# Patient Record
Sex: Female | Born: 1975 | Race: Black or African American | Hispanic: No | Marital: Single | Smoking: Current some day smoker
Health system: Southern US, Community
[De-identification: ages and names within clinical notes are randomized; demographics above are authoritative.]

## PROBLEM LIST (undated history)

## (undated) DIAGNOSIS — K589 Irritable bowel syndrome without diarrhea: Secondary | ICD-10-CM

## (undated) DIAGNOSIS — K5790 Diverticulosis of intestine, part unspecified, without perforation or abscess without bleeding: Secondary | ICD-10-CM

## (undated) DIAGNOSIS — Z3046 Encounter for surveillance of implantable subdermal contraceptive: Principal | ICD-10-CM

## (undated) DIAGNOSIS — B009 Herpesviral infection, unspecified: Secondary | ICD-10-CM

## (undated) DIAGNOSIS — E079 Disorder of thyroid, unspecified: Secondary | ICD-10-CM

## (undated) DIAGNOSIS — Z30017 Encounter for initial prescription of implantable subdermal contraceptive: Secondary | ICD-10-CM

## (undated) DIAGNOSIS — K7460 Unspecified cirrhosis of liver: Secondary | ICD-10-CM

## (undated) DIAGNOSIS — M25551 Pain in right hip: Secondary | ICD-10-CM

## (undated) DIAGNOSIS — K219 Gastro-esophageal reflux disease without esophagitis: Secondary | ICD-10-CM

## (undated) DIAGNOSIS — E669 Obesity, unspecified: Secondary | ICD-10-CM

## (undated) DIAGNOSIS — Z8619 Personal history of other infectious and parasitic diseases: Secondary | ICD-10-CM

## (undated) HISTORY — DX: Obesity, unspecified: E66.9

## (undated) HISTORY — DX: Herpesviral infection, unspecified: B00.9

## (undated) HISTORY — DX: Personal history of other infectious and parasitic diseases: Z86.19

## (undated) HISTORY — DX: Gastro-esophageal reflux disease without esophagitis: K21.9

## (undated) HISTORY — DX: Pain in right hip: M25.551

## (undated) HISTORY — DX: Encounter for surveillance of implantable subdermal contraceptive: Z30.46

## (undated) HISTORY — DX: Encounter for initial prescription of implantable subdermal contraceptive: Z30.017

---

## 2003-08-06 ENCOUNTER — Emergency Department (HOSPITAL_COMMUNITY): Admission: EM | Admit: 2003-08-06 | Discharge: 2003-08-06 | Payer: Self-pay | Admitting: Emergency Medicine

## 2003-08-07 ENCOUNTER — Ambulatory Visit (HOSPITAL_COMMUNITY): Admission: RE | Admit: 2003-08-07 | Discharge: 2003-08-07 | Payer: Self-pay | Admitting: Emergency Medicine

## 2005-06-02 ENCOUNTER — Inpatient Hospital Stay (HOSPITAL_COMMUNITY): Admission: AD | Admit: 2005-06-02 | Discharge: 2005-06-07 | Payer: Self-pay | Admitting: Obstetrics and Gynecology

## 2005-06-04 ENCOUNTER — Encounter: Payer: Self-pay | Admitting: Obstetrics and Gynecology

## 2005-08-30 ENCOUNTER — Emergency Department (HOSPITAL_COMMUNITY): Admission: EM | Admit: 2005-08-30 | Discharge: 2005-08-30 | Payer: Self-pay | Admitting: Emergency Medicine

## 2005-09-18 ENCOUNTER — Ambulatory Visit: Payer: Self-pay | Admitting: Orthopedic Surgery

## 2005-09-23 ENCOUNTER — Ambulatory Visit (HOSPITAL_COMMUNITY): Admission: RE | Admit: 2005-09-23 | Discharge: 2005-09-23 | Payer: Self-pay | Admitting: Orthopedic Surgery

## 2005-09-29 ENCOUNTER — Ambulatory Visit: Payer: Self-pay | Admitting: Orthopedic Surgery

## 2005-10-05 ENCOUNTER — Emergency Department (HOSPITAL_COMMUNITY): Admission: EM | Admit: 2005-10-05 | Discharge: 2005-10-05 | Payer: Self-pay | Admitting: Emergency Medicine

## 2005-10-07 ENCOUNTER — Encounter (HOSPITAL_COMMUNITY): Admission: RE | Admit: 2005-10-07 | Discharge: 2005-11-06 | Payer: Self-pay | Admitting: Orthopedic Surgery

## 2005-10-10 ENCOUNTER — Ambulatory Visit (HOSPITAL_COMMUNITY): Admission: RE | Admit: 2005-10-10 | Discharge: 2005-10-10 | Payer: Self-pay | Admitting: Internal Medicine

## 2007-01-21 ENCOUNTER — Other Ambulatory Visit: Admission: RE | Admit: 2007-01-21 | Discharge: 2007-01-21 | Payer: Self-pay | Admitting: Obstetrics and Gynecology

## 2007-05-04 ENCOUNTER — Emergency Department (HOSPITAL_COMMUNITY): Admission: EM | Admit: 2007-05-04 | Discharge: 2007-05-04 | Payer: Self-pay | Admitting: Emergency Medicine

## 2008-01-24 ENCOUNTER — Other Ambulatory Visit: Admission: RE | Admit: 2008-01-24 | Discharge: 2008-01-24 | Payer: Self-pay | Admitting: Obstetrics and Gynecology

## 2008-07-25 ENCOUNTER — Emergency Department (HOSPITAL_COMMUNITY): Admission: EM | Admit: 2008-07-25 | Discharge: 2008-07-26 | Payer: Self-pay | Admitting: Emergency Medicine

## 2008-10-31 ENCOUNTER — Emergency Department (HOSPITAL_COMMUNITY): Admission: EM | Admit: 2008-10-31 | Discharge: 2008-10-31 | Payer: Self-pay | Admitting: Emergency Medicine

## 2009-02-19 ENCOUNTER — Other Ambulatory Visit: Admission: RE | Admit: 2009-02-19 | Discharge: 2009-02-19 | Payer: Self-pay | Admitting: Obstetrics and Gynecology

## 2009-10-30 ENCOUNTER — Emergency Department (HOSPITAL_COMMUNITY): Admission: EM | Admit: 2009-10-30 | Discharge: 2009-10-30 | Payer: Self-pay | Admitting: Emergency Medicine

## 2010-02-25 ENCOUNTER — Other Ambulatory Visit: Admission: RE | Admit: 2010-02-25 | Discharge: 2010-02-25 | Payer: Self-pay | Admitting: Obstetrics and Gynecology

## 2010-03-06 ENCOUNTER — Ambulatory Visit (HOSPITAL_COMMUNITY): Admission: RE | Admit: 2010-03-06 | Discharge: 2010-03-06 | Payer: Self-pay | Admitting: Obstetrics & Gynecology

## 2010-07-28 LAB — URINALYSIS, ROUTINE W REFLEX MICROSCOPIC
Bilirubin Urine: NEGATIVE
Nitrite: NEGATIVE
Specific Gravity, Urine: 1.025 (ref 1.005–1.030)
pH: 6 (ref 5.0–8.0)

## 2010-07-28 LAB — GC/CHLAMYDIA PROBE AMP, GENITAL: GC Probe Amp, Genital: NEGATIVE

## 2010-07-28 LAB — PREGNANCY, URINE: Preg Test, Ur: NEGATIVE

## 2010-07-28 LAB — URINE MICROSCOPIC-ADD ON

## 2010-07-28 LAB — WET PREP, GENITAL: WBC, Wet Prep HPF POC: NONE SEEN

## 2010-07-28 LAB — GLUCOSE, CAPILLARY: Glucose-Capillary: 63 mg/dL — ABNORMAL LOW (ref 70–99)

## 2010-07-28 LAB — URINE CULTURE: Colony Count: 55000

## 2010-09-06 NOTE — Op Note (Signed)
Amy [name], Amy [name]              ACCOUNT NO.:  [id number]   MEDICAL RECORD NO.:  [id number]          PATIENT TYPE:  INP   LOCATION:  A413                          FACILITY:  [facility]   PHYSICIAN:  Tilda Burrow, M.D. DATE OF BIRTH:  [birth year]   DATE OF PROCEDURE:  06/04/2005  DATE OF DISCHARGE:                                 OPERATIVE REPORT   PREOPERATIVE DIAGNOSES:  1.  Pregnancy, 40+[redacted] weeks gestation.  2.  Failed induction, failure to progress.   POSTOPERATIVE DIAGNOSES:  1.  Pregnancy, 40+[redacted] weeks gestation.  2.  Failed induction, failure to progress.   PROCEDURE:  Primary low-transverse cervical Cesarean section.   SURGEON:  Tilda Burrow, M.D.   ASSISTANT:  Dr. Despina Hidden.   ANESTHESIA:  Spinal   COMPLICATIONS:  None.   ESTIMATED BLOOD LOSS:  600 cc.   Placenta to pathology. Baby to nursery with Dr. Milinda Cave. Healthy female  infant, Apgars 6 and 9.   INDICATIONS:  A 35 year old primiparous female undergoing two full-day  induction with Cytotec cervical ripening over night, Cervidil cervical  ripening throughout the day time, Foley bulb cervical ripening over night  the second night, with Pitocin induction begun on 4 a.m. on June 04, 2005 with virtually no progress. Presenting part remained out of the pelvis  despite the fact that it was a small infant. Cervix was quite long, fibrotic  and had absolutely no response even the Foley bulb placed in the cervix and  dilated to 60 cc. Once variable decelerations began to occur at midday,  Cesarean section was chosen.   DETAILS OF PROCEDURE:  The patient was taken to the operating room, prepped  and draped, spinal anesthesia introduced, Foley catheter inserted and lower  abdominal area prepped and draped for cesarean delivery. A 22-cm transverse  abdominal incision was made, with Bovie cautery dissection through the fatty  tissues, tranverse opening of the fascia in standard Pfannenstiel technique  and midline opening  of the peritoneal cavity. Bladder flap was developed on  the very mobile lower uterine segment. The presenting part was in no way  entering the pelvis. Transverse uterus incision was made using knife with  careful identification of the amniotic fluid cavity. Transverse  stretch of  the lower uterine segment followed, and then we were able to use vacuum  extraction to guide the vertex through the incision while fundal pressure  was applied. The infant tolerated the procedure well and was passed to Dr.  Milinda Cave. There was some initial bradycardia resulting in bag-mask  ventilation briefly. See his notes for further details.   Cord blood samples were obtained on the infant including arterial blood gas.  Amniotic fluid had been noted as clear. The placenta delivered intact  Tomasa Blase presentation in response to uterine massage. The uterus was then  quite hemostatic, irrigated with antibiotic-containing solution, followed by  closure of the uterus with single-layer running locking 0 chromic suture.  There was a figure of 8 suture required on the patient's right side. Bladder  flap was reapproximated using running 2-0 chromic. Abdomen was irrigated.  Peritoneum closed with 2-0  chromic. Rectus muscles were pulled together with  2-0 chromic as well. Sponge and needle counts on the peritoneal cavity were  -1, and so we recalled using a lap internally and removed it from the  abdominal cavity easily by reopening the peritoneal cavity briefly.  Peritoneum was reclosed without any difficulty or sequelae. Rectus muscles  pulled together in the midline again.   Fascia was then closed using continuous running 0 Vicryl with good tissue  edge approximation. Subcu fatty tissues were irrigated with antibiotic-  containing solution, a subcu JP drain placed in the fatty subcutaneous  tissues and then the fatty tissue itself reapproximated using two layers of  interrupted 2-0 plain sutures. Skin edges were  easily in approximation,  hemostasis confirmed and staple closure of the skin completed the procedure.  The patient then was allowed to go to the recovery room in good condition.  She did have a relatively high spinal and had complaints of difficulty  swallowing though she could reach, grasp, etc., easily. She did have T6  level maximum analgesic level.      Tilda Burrow, M.D.  Electronically Signed     JVF/MEDQ  D:  06/04/2005  T:  06/05/2005  Job:  [id number]   cc:   Jeoffrey Massed, MD  Fax: [phone]

## 2010-09-06 NOTE — Discharge Summary (Signed)
[name], [name]              ACCOUNT NO.:  [id number]   MEDICAL RECORD NO.:  [id number]          PATIENT TYPE:  INP   LOCATION:  A413                          FACILITY:  [facility]   PHYSICIAN:  Tilda Burrow, M.D. DATE OF BIRTH:  09-Jul-1975   DATE OF ADMISSION:  06/02/2005  DATE OF DISCHARGE:  02/17/2007LH                                 DISCHARGE SUMMARY   ADMISSION DIAGNOSIS:  Pregnancy at 51 weeks' gestation for medically  indicated induction of labor, due to post dates of pregnancy.  Morbid Obesity   DISCHARGE DIAGNOSES:  1.  Pregnancy at 41 weeks' delivered.  2.  Pregnancy with failed induction and failure to progress.  3.  Morbid Obesity   PROCEDURE:  Primary low transverse cervical cesarean section.   DISCHARGE MEDICATION:  Tylox one q.4h. p.r.n. pain.   HISTORY OF PRESENT ILLNESS:  This 35 year old, primiparous female was  admitted for induction at 41 weeks for post dates pregnancy.  Pregnancy was  notable for morbid obesity with weight of 343 pounds.  Blood pressure was  normal.  The patient was admitted for Foley bulb cervical ripening and  hopeful Pitocin induction of labor.   HOSPITAL COURSE:  The patient was admitted on February 12.  The patient's  initial monitoring was notable for an irregularly, irregular fetal heart  rate which showed the presence of fetal premature ventricular contractions.  We proceeded with Cytotec induction.  Ultrasound had shown normal amount of  fluid around the baby.  She made no changes in her cervix on the morning of  June 03, 2005, despite Cytotec overnight.  We, therefore, attempted  Cytotec on June 02, 2005, since she had not responded to efforts of  Pitocin from 4 a.m. to 9 a.m. on June 03, 2005.  Cytotec was continued  through the day.  That evening, efforts were made to replace Foley bulb  again.  The Foley bulb was inserted at 5 p.m. on June 03, 2005.  Overnight, the cervix changed slightly.  Cervix was 1-2  cm and very, very  long, firm and -3 despite Foley bulb.  We were able to do amniotomy at 8  a.m. on June 04, 2005.  The patient had poor prognosis for vaginal  delivery.  We attempted induction through the day, but despite optimal  efforts at induction we were unable to achieve cervical changes and she was  taken for cesarean section which delivered a healthy infant with 600 mL  estimated blood loss.  Apgar's on the infant were 6 and 9 at 1 minute and 3  minutes.  He was a healthy, female infant.   The patient had a postoperative hemoglobin of 11.6, hematocrit 34.3.  The  infant's blood gas was pH 7.20, pCO2 61.0, pO2 13 with base excess 4.0.  Maternal blood type was O positive.   The patient had an uneventful postoperative course after the cesarean on  February 14, and ended up with a 3-day postpartum stay and discharged home  in good condition with staples in place.   FOLLOW UP:  Follow up schedule for the following Monday for  routine  postpartum visit and staple removal.      Tilda Burrow, M.D.  Electronically Signed     JVF/MEDQ  D:  07/13/2005  T:  07/15/2005  Job:  [id number]

## 2010-09-06 NOTE — H&P (Signed)
NAMENIAMH, RADA              ACCOUNT NO.:  [id number]   MEDICAL RECORD NO.:  [id number]          PATIENT TYPE:  INP   LOCATION:  LDR1                          FACILITY:  [facility]   PHYSICIAN:  Tilda Burrow, M.D. DATE OF BIRTH:  07-10-1975   DATE OF ADMISSION:  06/02/2005  DATE OF DISCHARGE:  LH                                HISTORY & PHYSICAL   REASON FOR ADMISSION:  Pregnancy at [redacted] weeks gestation for elective  induction of labor due to post dates.   PAST MEDICAL HISTORY:  Negative.   PAST SURGICAL HISTORY:  Negative.   ALLERGIES:  She has no known allergies.   MEDICATIONS:  She has taken prenatal vitamins and Valtrex 500 mg p.o. daily.   PRENATAL COURSE:  Essentially uneventful.  Blood type is O positive.  UDS is  positive for THC and negative on repeat.  Hepatitis B surface antigen  negative.  HIV is negative.  Rubella immune.  HV2 is positive.  Serology is  nonreactive.  Pap smear normal.  GC and Chlamydia negative on both cultures.  AFP normal.  GBS positive at 28 weeks.  Hemoglobin 11.7 at 28 weeks.  Hematocrit 6.7.  One-hour glucose 150.  ____________ is negative.   PHYSICAL EXAMINATION:  VITAL SIGNS:  Weight 343 pounds.  Blood pressure  120/80.  Fetal heart rate 140, strong and regular.  ABDOMEN:  Fundal height is 46 cm.  Ultrasound was done last week and  estimated fetal weight is probably approximately 6 pounds and 13 ounces.  CERVICAL:  She is long, thick and closed and anterior.   PLAN:  Going to admit for Foley bulb induction of labor.  GBS prophylaxis.      Zerita Boers, Lanier Clam      Tilda Burrow, M.D.  Electronically Signed    DL/MEDQ  D:  16/01/9603  T:  06/02/2005  Job:  [id number]

## 2011-01-08 LAB — URINALYSIS, ROUTINE W REFLEX MICROSCOPIC
Bilirubin Urine: NEGATIVE
Glucose, UA: NEGATIVE
Hgb urine dipstick: NEGATIVE
Ketones, ur: NEGATIVE
Specific Gravity, Urine: 1.02
pH: 5.5

## 2011-01-08 LAB — PREGNANCY, URINE: Preg Test, Ur: NEGATIVE

## 2011-03-22 ENCOUNTER — Emergency Department (HOSPITAL_COMMUNITY): Payer: Self-pay

## 2011-03-22 ENCOUNTER — Emergency Department (HOSPITAL_COMMUNITY)
Admission: EM | Admit: 2011-03-22 | Discharge: 2011-03-22 | Disposition: A | Discharge status: Home / Self Care | Payer: Self-pay | Attending: Emergency Medicine | Admitting: Emergency Medicine

## 2011-03-22 DIAGNOSIS — B9789 Other viral agents as the cause of diseases classified elsewhere: Secondary | ICD-10-CM | POA: Insufficient documentation

## 2011-03-22 DIAGNOSIS — R05 Cough: Secondary | ICD-10-CM | POA: Insufficient documentation

## 2011-03-22 DIAGNOSIS — R059 Cough, unspecified: Secondary | ICD-10-CM | POA: Insufficient documentation

## 2011-03-22 DIAGNOSIS — F17200 Nicotine dependence, unspecified, uncomplicated: Secondary | ICD-10-CM | POA: Insufficient documentation

## 2011-03-22 DIAGNOSIS — B349 Viral infection, unspecified: Secondary | ICD-10-CM

## 2011-03-22 DIAGNOSIS — N390 Urinary tract infection, site not specified: Secondary | ICD-10-CM | POA: Insufficient documentation

## 2011-03-22 DIAGNOSIS — J3489 Other specified disorders of nose and nasal sinuses: Secondary | ICD-10-CM | POA: Insufficient documentation

## 2011-03-22 DIAGNOSIS — R109 Unspecified abdominal pain: Secondary | ICD-10-CM | POA: Insufficient documentation

## 2011-03-22 LAB — URINE MICROSCOPIC-ADD ON

## 2011-03-22 LAB — CBC
HCT: 49.7 % — ABNORMAL HIGH (ref 36.0–46.0)
Hemoglobin: 16.6 g/dL — ABNORMAL HIGH (ref 12.0–15.0)
MCH: 27.9 pg (ref 26.0–34.0)
MCHC: 33.4 g/dL (ref 30.0–36.0)
MCV: 83.7 fL (ref 78.0–100.0)
RBC: 5.94 MIL/uL — ABNORMAL HIGH (ref 3.87–5.11)

## 2011-03-22 LAB — COMPREHENSIVE METABOLIC PANEL
ALT: 15 U/L (ref 0–35)
Alkaline Phosphatase: 53 U/L (ref 39–117)
BUN: 8 mg/dL (ref 6–23)
CO2: 25 mEq/L (ref 19–32)
Calcium: 9.1 mg/dL (ref 8.4–10.5)
GFR calc Af Amer: 90 mL/min — ABNORMAL LOW (ref >90)
GFR calc non Af Amer: 77 mL/min — ABNORMAL LOW (ref >90)
Glucose, Bld: 73 mg/dL (ref 70–99)
Total Protein: 7.4 g/dL (ref 6.0–8.3)

## 2011-03-22 LAB — URINALYSIS, ROUTINE W REFLEX MICROSCOPIC
Glucose, UA: NEGATIVE mg/dL
Hgb urine dipstick: NEGATIVE
Ketones, ur: NEGATIVE mg/dL
Leukocytes, UA: NEGATIVE
pH: 6 (ref 5.0–8.0)

## 2011-03-22 LAB — PREGNANCY, URINE: Preg Test, Ur: NEGATIVE

## 2011-03-22 MED ORDER — ONDANSETRON HCL 4 MG/2ML IJ SOLN
4.0000 mg | Freq: Once | INTRAMUSCULAR | Status: AC
  Administered 2011-03-22: 4 mg via INTRAVENOUS
  Filled 2011-03-22: qty 2

## 2011-03-22 MED ORDER — HYDROCODONE-ACETAMINOPHEN 5-325 MG PO TABS: 1.0000 | ORAL_TABLET | Freq: Four times a day (QID) | ORAL | Status: AC | PRN

## 2011-03-22 MED ORDER — SODIUM CHLORIDE 0.9 % IV BOLUS (SEPSIS)
250.0000 mL | Freq: Once | INTRAVENOUS | Status: AC
  Administered 2011-03-22: 250 mL via INTRAVENOUS

## 2011-03-22 MED ORDER — DEXTROSE 5 % IV SOLN
1.0000 g | INTRAVENOUS | Status: DC
  Administered 2011-03-22: 1 g via INTRAVENOUS
  Filled 2011-03-22: qty 10

## 2011-03-22 MED ORDER — HYDROMORPHONE HCL PF 1 MG/ML IJ SOLN
1.0000 mg | Freq: Once | INTRAMUSCULAR | Status: AC
  Administered 2011-03-22: 1 mg via INTRAVENOUS
  Filled 2011-03-22: qty 1

## 2011-03-22 MED ORDER — PROMETHAZINE HCL 25 MG PO TABS: 25.0000 mg | ORAL_TABLET | Freq: Four times a day (QID) | ORAL | Status: DC | PRN

## 2011-03-22 MED ORDER — SODIUM CHLORIDE 0.9 % IV SOLN: INTRAVENOUS | Status: DC

## 2011-03-22 MED ORDER — IOHEXOL 300 MG/ML  SOLN: 100.0000 mL | Freq: Once | INTRAMUSCULAR | Status: DC | PRN

## 2011-03-22 MED ORDER — CEPHALEXIN 500 MG PO CAPS: 500.0000 mg | ORAL_CAPSULE | Freq: Four times a day (QID) | ORAL | Status: AC

## 2011-03-22 NOTE — ED Notes (Signed)
Pt presents with fever, chills, sweating, cough, vomiting, diarrhea and dizziness since Wednesday.

## 2011-03-22 NOTE — ED Provider Notes (Signed)
History  This chart was scribed for Amy Jakes, MD by [name]. This patient was seen in room APA12/APA12 and the patient's care was started at 5:22PM.  CSN: [id number] Arrival date & time: 03/22/2011  5:01 PM   First MD Initiated Contact with Patient 03/22/11 1709      Chief Complaint  Patient presents with  . Cough  . Dizziness  . Fever  . Chills  . Diarrhea  . Emesis  . Excessive Sweating    Patient is a 35 y.o. female presenting with flu symptoms. The history is provided by the patient. No language interpreter was used.  Influenza This is a new problem. The current episode started 2 days ago. The problem occurs constantly. The problem has been gradually worsening. Associated symptoms include abdominal pain and headaches. Pertinent negatives include no chest pain and no shortness of breath. The symptoms are aggravated by nothing. The symptoms are relieved by nothing. She has tried nothing for the symptoms.   Amy [name] is a 35 y.o. female who presents to the Emergency Department complaining of 2 days of influenza symptoms including fever, chills, sweating, cough, vomiting, diarrhea, and dizziness. Fever was measured at 98.5 in the ED. Pt also c/o 5 or 6 out of 10 abdominal pain described as a burning sensation located across both upper quadrants.  Pt states that she has had the productive cough with brown mucous for one week, has had one episode of vomiting along with 3 episodes of diarrhea before arriving to the ED.  Pt reports having a sick client with similar symptoms one week ago at her work, [employer].  Pt does not have a PCP.  History reviewed. No pertinent past medical history.  Past Surgical History  Procedure Date  . Cesarean section     No family history on file.  History  Substance Use Topics  . Smoking status: Current Some Day Smoker -- 0.5 packs/day  . Smokeless tobacco: Not on file  . Alcohol Use: Yes     occ    OB History    Grav Para Term Preterm Abortions TAB SAB Ect Mult Living                  Review of Systems  Constitutional: Positive for fever, chills and diaphoresis.  HENT: Negative for sore throat, rhinorrhea and neck pain.   Eyes: Negative for visual disturbance.  Respiratory: Positive for cough. Negative for shortness of breath.   Cardiovascular: Negative for chest pain and leg swelling.  Gastrointestinal: Positive for vomiting, abdominal pain and diarrhea. Negative for blood in stool.  Genitourinary: Negative for dysuria and hematuria.  Musculoskeletal: Negative for back pain and joint swelling.  Skin: Negative for rash.  Neurological: Positive for dizziness and headaches.  Hematological: Does not bruise/bleed easily.    Allergies  Review of patient's allergies indicates no known allergies.  Home Medications   Current Outpatient Rx  Name Route Sig Dispense Refill  . CEPHALEXIN 500 MG PO CAPS Oral Take 1 capsule (500 mg total) by mouth 4 (four) times daily. 28 capsule 0  . HYDROCODONE-ACETAMINOPHEN 5-325 MG PO TABS Oral Take 1-2 tablets by mouth every 6 (six) hours as needed for pain. 10 tablet 0  . PROMETHAZINE HCL 25 MG PO TABS Oral Take 1 tablet (25 mg total) by mouth every 6 (six) hours as needed for nausea. 12 tablet 0    Triage Vitals: BP 135/78  Pulse 92  Temp(Src) 98.5 F (36.9 C) (  Oral)  Resp 20  Ht 5\' 6"  (1.676 m)  Wt 325 lb (147.419 kg)  BMI 52.46 kg/m2  SpO2 99%  Physical Exam  Nursing note and vitals reviewed. Constitutional: She is oriented to person, place, and time. She appears well-developed and well-nourished.  HENT:  Head: Normocephalic and atraumatic.       Mucous membranes are slightly dry but no erythema to the pharnyx  Eyes: Conjunctivae and EOM are normal.  Neck: Neck supple.  Cardiovascular: Normal rate, regular rhythm and normal heart sounds.   Pulmonary/Chest: Effort normal and breath sounds normal.  Abdominal: Soft. Bowel sounds are normal. There  is tenderness (mild tenderness across the upper quandrants).  Musculoskeletal: Normal range of motion. She exhibits no edema.  Lymphadenopathy:    She has no cervical adenopathy.  Neurological: She is alert and oriented to person, place, and time.  Skin: Skin is warm and dry.    ED Course  Procedures (including critical care time)  DIAGNOSTIC STUDIES: Oxygen Saturation is 99% on room air, normal by my interpretation.    COORDINATION OF CARE: 5:25PM-Discussed treatment plan with pt at bedside and pt agreed to plan. 8:17PM-All labs and x-rays reviewed with patient and discharge plan discussed.  Results for orders placed during the hospital encounter of 03/22/11  CBC      Component Value Range   WBC 7.0  4.0 - 10.5 (K/uL)   RBC 5.94 (*) 3.87 - 5.11 (MIL/uL)   Hemoglobin 16.6 (*) 12.0 - 15.0 (g/dL)   HCT 08.6 (*) 57.8 - 46.0 (%)   MCV 83.7  78.0 - 100.0 (fL)   MCH 27.9  26.0 - 34.0 (pg)   MCHC 33.4  30.0 - 36.0 (g/dL)   RDW 46.9  62.9 - 52.8 (%)   Platelets 213  150 - 400 (K/uL)  COMPREHENSIVE METABOLIC PANEL      Component Value Range   Sodium 136  135 - 145 (mEq/L)   Potassium 3.9  3.5 - 5.1 (mEq/L)   Chloride 103  96 - 112 (mEq/L)   CO2 25  19 - 32 (mEq/L)   Glucose, Bld 73  70 - 99 (mg/dL)   BUN 8  6 - 23 (mg/dL)   Creatinine, Ser 4.13  0.50 - 1.10 (mg/dL)   Calcium 9.1  8.4 - 24.4 (mg/dL)   Total Protein 7.4  6.0 - 8.3 (g/dL)   Albumin 3.1 (*) 3.5 - 5.2 (g/dL)   AST 19  0 - 37 (U/L)   ALT 15  0 - 35 (U/L)   Alkaline Phosphatase 53  39 - 117 (U/L)   Total Bilirubin 0.1 (*) 0.3 - 1.2 (mg/dL)   GFR calc non Af Amer 77 (*) >90 (mL/min)   GFR calc Af Amer 90 (*) >90 (mL/min)  LIPASE, BLOOD      Component Value Range   Lipase 13  11 - 59 (U/L)  URINALYSIS, ROUTINE W REFLEX MICROSCOPIC      Component Value Range   Color, Urine YELLOW  YELLOW    APPearance HAZY (*) CLEAR    Specific Gravity, Urine 1.025  1.005 - 1.030    pH 6.0  5.0 - 8.0    Glucose, UA NEGATIVE   NEGATIVE (mg/dL)   Hgb urine dipstick NEGATIVE  NEGATIVE    Bilirubin Urine NEGATIVE  NEGATIVE    Ketones, ur NEGATIVE  NEGATIVE (mg/dL)   Protein, ur NEGATIVE  NEGATIVE (mg/dL)   Urobilinogen, UA 0.2  0.0 - 1.0 (mg/dL)   Nitrite  POSITIVE (*) NEGATIVE    Leukocytes, UA NEGATIVE  NEGATIVE   PREGNANCY, URINE      Component Value Range   Preg Test, Ur NEGATIVE    URINE MICROSCOPIC-ADD ON      Component Value Range   Squamous Epithelial / LPF MANY (*) RARE    WBC, UA 7-10  <3 (WBC/hpf)   Bacteria, UA MANY (*) RARE    Dg Chest 2 View  03/22/2011  *RADIOLOGY REPORT*  Clinical Data: Cough and congestion  CHEST - 2 VIEW  Comparison: None.  Findings: Heart, mediastinal, and hilar contours are normal. Pulmonary vascularity is normal.  The lungs are clear.  Negative for pleural effusion or pneumothorax.  The trachea is midline. Bony thorax is unremarkable.  IMPRESSION: No acute cardiopulmonary disease.  Original Report Authenticated By: Britta Mccreedy, M.D.   Ct Abdomen Pelvis W Contrast  03/22/2011  *RADIOLOGY REPORT*  Clinical Data: Abdominal pain  CT ABDOMEN AND PELVIS WITH CONTRAST  Technique:  Multidetector CT imaging of the abdomen and pelvis was performed following the standard protocol during bolus administration of intravenous contrast.  Contrast:  100 ml Omnipaque-300  Comparison: None.  Findings: Imaged lung bases are clear.  Heart size is normal. There is fatty infiltration of the liver.  No focal liver mass or biliary ductal dilatation.  Spleen is upper normal in size.  The pancreas, adrenal glands, and kidneys are within normal limits.  Several small mesenteric upper abdominal lymph nodes are seen. These are not pathologically enlarged.  The stomach is normal. Small bowel loops are normal in caliber and wall thickness.  Colon is normal in caliber.  There all are scattered colonic diverticula. No evidence of acute diverticulitis.  Small umbilical hernia contains fat only and measures 18 mm.  A  definite appendix is not visualized.  No findings to suggest acute appendicitis.  Abdominal aorta is normal in caliber. Negative for ascites or free air.  The uterus and adnexa are within normal limits.  Mildly prominent bilateral pelvic sidewall lymph nodes, measuring up to 11 mm short axis bilaterally.  Imaged thoracolumbar spine vertebral bodies are normal in height and alignment.  No acute or suspicious osseous abnormality.  IMPRESSION:  1.  No definite explanation for patient pain. 2.  Fatty infiltration of the liver. 3.  Two nonspecific mildly prominent pelvic sidewall lymph nodes. 4.  Colonic diverticulosis.  No evidence of diverticulitis.  Original Report Authenticated By: Britta Mccreedy, M.D.    1. Urinary tract infection   2. Viral illness       MDM   Suspect viral illness as cause for abdominal pain and gastritis and some enteritis, urine suspicious for urinary tract infection may not be related to the patient's current symptoms we'll treat that given one dose of Rocephin IV in the emergency department we'll send home on Keflex. CT of the abdomen is negative and labs without significant abnormalities.      I personally performed the services described in this documentation, which was scribed in my presence. The recorded information has been reviewed and considered.     Amy Jakes, MD 03/22/11 2130

## 2011-03-22 NOTE — ED Notes (Signed)
Pt reports having had productive cough with dark green sputum  x 1 1/2 weeks.   Reports last THursday started having abd pain, diarrhea, and vomiting.  Pt reports feels lightheaded.  Pt alert and oriented at this time.  Bowel sounds present.  Abd tender to palpate in upper quads.

## 2011-05-21 ENCOUNTER — Other Ambulatory Visit: Payer: Self-pay | Admitting: Adult Health

## 2011-05-21 ENCOUNTER — Other Ambulatory Visit (HOSPITAL_COMMUNITY)
Admission: RE | Admit: 2011-05-21 | Discharge: 2011-05-21 | Disposition: A | Discharge status: Home / Self Care | Payer: Medicaid Other | Source: Ambulatory Visit | Attending: Obstetrics and Gynecology | Admitting: Obstetrics and Gynecology

## 2011-05-21 DIAGNOSIS — Z01419 Encounter for gynecological examination (general) (routine) without abnormal findings: Secondary | ICD-10-CM | POA: Insufficient documentation

## 2011-05-21 DIAGNOSIS — Z113 Encounter for screening for infections with a predominantly sexual mode of transmission: Secondary | ICD-10-CM | POA: Insufficient documentation

## 2012-01-30 ENCOUNTER — Encounter (HOSPITAL_COMMUNITY): Payer: Self-pay | Admitting: Emergency Medicine

## 2012-01-30 ENCOUNTER — Emergency Department (HOSPITAL_COMMUNITY)
Admission: EM | Admit: 2012-01-30 | Discharge: 2012-01-30 | Disposition: A | Discharge status: Home / Self Care | Payer: Medicaid Other | Attending: Emergency Medicine | Admitting: Emergency Medicine

## 2012-01-30 ENCOUNTER — Emergency Department (HOSPITAL_COMMUNITY): Payer: Medicaid Other

## 2012-01-30 DIAGNOSIS — F17200 Nicotine dependence, unspecified, uncomplicated: Secondary | ICD-10-CM | POA: Insufficient documentation

## 2012-01-30 DIAGNOSIS — M25579 Pain in unspecified ankle and joints of unspecified foot: Secondary | ICD-10-CM | POA: Insufficient documentation

## 2012-01-30 DIAGNOSIS — M25572 Pain in left ankle and joints of left foot: Secondary | ICD-10-CM

## 2012-01-30 MED ORDER — IBUPROFEN 800 MG PO TABS
800.0000 mg | ORAL_TABLET | Freq: Once | ORAL | Status: AC
  Administered 2012-01-30: 800 mg via ORAL
  Filled 2012-01-30: qty 1

## 2012-01-30 NOTE — ED Notes (Signed)
Pt c/o bilateral foot intermittently for years. Pt denies any injury to the feet.

## 2012-01-30 NOTE — ED Provider Notes (Signed)
History     CSN: [id number]  Arrival date & time 01/30/12  0808   First MD Initiated Contact with Patient 01/30/12 [phone]      Chief Complaint  Patient presents with  . Foot Pain    (Consider location/radiation/quality/duration/timing/severity/associated sxs/prior treatment) HPI Comments: Pt has had bilateral foot/ankle  pain "for years" but L is worse than R.  No injury.  Pain worse in past couple days.  Is not treating.  No PCP.  No prior evaluation.  Patient is a 36 y.o. female presenting with lower extremity pain. The history is provided by the patient. No language interpreter was used.  Foot Pain This is a chronic problem. The problem occurs constantly. The problem has been unchanged. Pertinent negatives include no joint swelling, numbness or weakness. The symptoms are aggravated by walking and standing. She has tried nothing for the symptoms.    History reviewed. No pertinent past medical history.  Past Surgical History  Procedure Date  . Cesarean section     History reviewed. No pertinent family history.  History  Substance Use Topics  . Smoking status: Current Some Day Smoker -- 0.5 packs/day    Types: Cigarettes  . Smokeless tobacco: Not on file  . Alcohol Use: Yes     occ    OB History    Grav Para Term Preterm Abortions TAB SAB Ect Mult Living                  Review of Systems  Musculoskeletal: Positive for gait problem. Negative for joint swelling.       Foot/ankle pain  Skin: Negative for color change and wound.  Neurological: Negative for weakness and numbness.  All other systems reviewed and are negative.    Allergies  Keflex  Home Medications  No current outpatient prescriptions on file.  BP 104/72  Pulse 96  Temp 98.3 F (36.8 C) (Oral)  Resp 17  Ht 5\' 4"  (1.626 m)  Wt 404 lb 5 oz (183.395 kg)  BMI 69.40 kg/m2  SpO2 99%  Physical Exam  Nursing note and vitals reviewed. Constitutional: She is oriented to person, place, and time.  Vital signs are normal. She appears well-developed and well-nourished.  Non-toxic appearance. She does not have a sickly appearance. She does not appear ill. No distress.       Pt is morbidly obese.  HENT:  Head: Normocephalic and atraumatic.  Eyes: EOM are normal.  Neck: Normal range of motion.  Cardiovascular: Normal rate, regular rhythm and normal heart sounds.   Pulmonary/Chest: Effort normal and breath sounds normal.  Abdominal: Soft. She exhibits no distension. There is no tenderness.  Musculoskeletal:       Left ankle: She exhibits decreased range of motion. She exhibits no swelling, no ecchymosis, no deformity, no laceration and normal pulse. tenderness. Medial malleolus tenderness found. Achilles tendon normal.       Feet:  Neurological: She is alert and oriented to person, place, and time.  Skin: Skin is warm and dry.  Psychiatric: She has a normal mood and affect. Judgment normal.    ED Course  Procedures (including critical care time)  Labs Reviewed - No data to display Dg Ankle Complete Left  01/30/2012  *RADIOLOGY REPORT*  Clinical Data: Chronic left ankle pain for years, no known injury  LEFT ANKLE COMPLETE - 3+ VIEW  Comparison: None  Findings: Mild diffuse soft tissue swelling. Ankle mortise intact. Osseous mineralization normal. No acute fracture, dislocation or bone destruction.  IMPRESSION: No acute osseous abnormalities.   Original Report Authenticated By: Lollie Marrow, M.D.      1. Bilateral ankle pain       MDM  No fxs Ibuprofen TID Find a PCP        Evalina Field, PA 01/30/12 [phone]

## 2012-01-30 NOTE — ED Provider Notes (Signed)
Medical screening examination/treatment/procedure(s) were performed by non-physician practitioner and as supervising physician I was immediately available for consultation/collaboration.   Dione Booze, MD 01/30/12 [phone]

## 2012-04-22 ENCOUNTER — Emergency Department (HOSPITAL_COMMUNITY)
Admission: EM | Admit: 2012-04-22 | Discharge: 2012-04-22 | Disposition: A | Discharge status: Home / Self Care | Payer: Medicaid Other | Attending: Emergency Medicine | Admitting: Emergency Medicine

## 2012-04-22 ENCOUNTER — Emergency Department (HOSPITAL_COMMUNITY): Payer: Medicaid Other

## 2012-04-22 ENCOUNTER — Encounter (HOSPITAL_COMMUNITY): Payer: Self-pay | Admitting: *Deleted

## 2012-04-22 DIAGNOSIS — Z3202 Encounter for pregnancy test, result negative: Secondary | ICD-10-CM | POA: Insufficient documentation

## 2012-04-22 DIAGNOSIS — R05 Cough: Secondary | ICD-10-CM | POA: Insufficient documentation

## 2012-04-22 DIAGNOSIS — F17200 Nicotine dependence, unspecified, uncomplicated: Secondary | ICD-10-CM | POA: Insufficient documentation

## 2012-04-22 DIAGNOSIS — N390 Urinary tract infection, site not specified: Secondary | ICD-10-CM | POA: Insufficient documentation

## 2012-04-22 DIAGNOSIS — J3489 Other specified disorders of nose and nasal sinuses: Secondary | ICD-10-CM | POA: Insufficient documentation

## 2012-04-22 DIAGNOSIS — R059 Cough, unspecified: Secondary | ICD-10-CM | POA: Insufficient documentation

## 2012-04-22 DIAGNOSIS — R509 Fever, unspecified: Secondary | ICD-10-CM | POA: Insufficient documentation

## 2012-04-22 DIAGNOSIS — J40 Bronchitis, not specified as acute or chronic: Secondary | ICD-10-CM | POA: Insufficient documentation

## 2012-04-22 DIAGNOSIS — R61 Generalized hyperhidrosis: Secondary | ICD-10-CM | POA: Insufficient documentation

## 2012-04-22 LAB — URINALYSIS, ROUTINE W REFLEX MICROSCOPIC
Bilirubin Urine: NEGATIVE
Ketones, ur: NEGATIVE mg/dL
Nitrite: POSITIVE — AB
Specific Gravity, Urine: 1.02 (ref 1.005–1.030)
Urobilinogen, UA: 0.2 mg/dL (ref 0.0–1.0)

## 2012-04-22 LAB — BASIC METABOLIC PANEL
BUN: 7 mg/dL (ref 6–23)
Creatinine, Ser: 0.88 mg/dL (ref 0.50–1.10)
GFR calc Af Amer: >90 mL/min (ref >90)
GFR calc non Af Amer: 84 mL/min — ABNORMAL LOW (ref >90)

## 2012-04-22 LAB — URINE MICROSCOPIC-ADD ON

## 2012-04-22 LAB — CBC WITH DIFFERENTIAL/PLATELET
Basophils Absolute: 0 10*3/uL (ref 0.0–0.1)
Basophils Relative: 0 % (ref 0–1)
MCHC: 33.1 g/dL (ref 30.0–36.0)
Neutro Abs: 7.8 10*3/uL — ABNORMAL HIGH (ref 1.7–7.7)
Neutrophils Relative %: 87 % — ABNORMAL HIGH (ref 43–77)
RDW: 14 % (ref 11.5–15.5)

## 2012-04-22 MED ORDER — PROMETHAZINE-CODEINE 6.25-10 MG/5ML PO SYRP: 5.0000 mL | ORAL_SOLUTION | ORAL | Status: DC | PRN

## 2012-04-22 MED ORDER — NITROFURANTOIN MONOHYD MACRO 100 MG PO CAPS
100.0000 mg | ORAL_CAPSULE | Freq: Once | ORAL | Status: AC
  Administered 2012-04-22: 100 mg via ORAL
  Filled 2012-04-22: qty 1

## 2012-04-22 MED ORDER — SODIUM CHLORIDE 0.9 % IV BOLUS (SEPSIS)
1000.0000 mL | Freq: Once | INTRAVENOUS | Status: AC
  Administered 2012-04-22: 1000 mL via INTRAVENOUS

## 2012-04-22 MED ORDER — IBUPROFEN 800 MG PO TABS
800.0000 mg | ORAL_TABLET | Freq: Once | ORAL | Status: AC
  Administered 2012-04-22: 800 mg via ORAL
  Filled 2012-04-22: qty 1

## 2012-04-22 MED ORDER — ONDANSETRON HCL 4 MG/2ML IJ SOLN
4.0000 mg | Freq: Once | INTRAMUSCULAR | Status: AC
  Administered 2012-04-22: 4 mg via INTRAVENOUS
  Filled 2012-04-22: qty 2

## 2012-04-22 MED ORDER — PROMETHAZINE-CODEINE 6.25-10 MG/5ML PO SYRP
10.0000 mL | ORAL_SOLUTION | Freq: Once | ORAL | Status: AC
  Administered 2012-04-22: 10 mL via ORAL
  Filled 2012-04-22: qty 10

## 2012-04-22 MED ORDER — NITROFURANTOIN MONOHYD MACRO 100 MG PO CAPS: 100.0000 mg | ORAL_CAPSULE | Freq: Two times a day (BID) | ORAL | Status: DC

## 2012-04-22 NOTE — ED Provider Notes (Signed)
History     CSN: [id number]  Arrival date & time 04/22/12  1941   First MD Initiated Contact with Patient 04/22/12 2038      Chief Complaint  Patient presents with  . Chest Pain    (Consider location/radiation/quality/duration/timing/severity/associated sxs/prior treatment) HPI Comments: Amy [name] presents with a 48 hour history of cough which has been productive of yellow sputum, chills and subjective fever along with nausea, made worse with coughing,  Feeling generalized weakness with decreased PO intake and had been briefly lightheaded when standing too quickly today and has chest pain which is dull and worse with coughing.  She has taken theraflu at home with no improvement in symptoms.  She also describes urinary frequency since yesterday.  She has not had hematuria or dysuria and denies abdominal and low back pain.  The history is provided by the patient.    History reviewed. No pertinent past medical history.  Past Surgical History  Procedure Date  . Cesarean section     History reviewed. No pertinent family history.  History  Substance Use Topics  . Smoking status: Current Some Day Smoker -- 0.5 packs/day    Types: Cigarettes  . Smokeless tobacco: Not on file  . Alcohol Use: Yes     Comment: occ    OB History    Grav Para Term Preterm Abortions TAB SAB Ect Mult Living                  Review of Systems  Constitutional: Positive for fever and diaphoresis.  HENT: Positive for rhinorrhea. Negative for ear pain, congestion, sore throat, neck pain and tinnitus.   Eyes: Negative.   Respiratory: Positive for cough. Negative for chest tightness, shortness of breath, wheezing and stridor.   Cardiovascular: Negative for chest pain, palpitations and leg swelling.  Gastrointestinal: Negative for nausea and abdominal pain.  Genitourinary: Negative.   Musculoskeletal: Negative for joint swelling and arthralgias.  Skin: Negative.  Negative for rash and wound.    Neurological: Negative for dizziness, weakness, light-headedness, numbness and headaches.  Hematological: Negative.   Psychiatric/Behavioral: Negative.     Allergies  Keflex  Home Medications   Current Outpatient Rx  Name  Route  Sig  Dispense  Refill  . PHENYLEPHRINE-PHENIRAMINE-DM 02-08-19 MG PO PACK   Oral   Take 1 packet by mouth once as needed. For cold and flu symptoms         . NITROFURANTOIN MONOHYD MACRO 100 MG PO CAPS   Oral   Take 1 capsule (100 mg total) by mouth 2 (two) times daily.   10 capsule   0   . PROMETHAZINE-CODEINE 6.25-10 MG/5ML PO SYRP   Oral   Take 5 mLs by mouth every 4 (four) hours as needed for cough.   120 mL   0     BP 126/66  Pulse 98  Temp 99.3 F (37.4 C) (Oral)  Resp 20  Ht 5\' 6"  (1.676 m)  Wt 406 lb (184.16 kg)  BMI 65.53 kg/m2  SpO2 95%  Physical Exam  Nursing note and vitals reviewed. Constitutional: She appears well-developed and well-nourished.  HENT:  Head: Normocephalic and atraumatic.  Mouth/Throat: Oropharynx is clear and moist.  Eyes: Conjunctivae normal are normal.  Neck: Normal range of motion. Neck supple.  Cardiovascular: Normal rate, regular rhythm, normal heart sounds and intact distal pulses.   Pulmonary/Chest: Effort normal and breath sounds normal. No respiratory distress. She has no decreased breath sounds. She has no wheezes.  She has no rhonchi. She has no rales. She exhibits no tenderness.  Abdominal: Soft. Bowel sounds are normal. She exhibits no distension. There is no tenderness. There is no rebound and no guarding.       Morbidly obese,  Difficult abdominal exam.  Musculoskeletal: Normal range of motion.  Neurological: She is alert.  Skin: Skin is warm and dry.  Psychiatric: She has a normal mood and affect.    ED Course  Procedures (including critical care time)  Labs Reviewed  URINALYSIS, ROUTINE W REFLEX MICROSCOPIC - Abnormal; Notable for the following:    Nitrite POSITIVE (*)     All  other components within normal limits  BASIC METABOLIC PANEL - Abnormal; Notable for the following:    Sodium 132 (*)     Glucose, Bld 105 (*)     GFR calc non Af Amer 84 (*)     All other components within normal limits  CBC WITH DIFFERENTIAL - Abnormal; Notable for the following:    Neutrophils Relative 87 (*)     Neutro Abs 7.8 (*)     Lymphocytes Relative 8 (*)     All other components within normal limits  URINE MICROSCOPIC-ADD ON - Abnormal; Notable for the following:    Bacteria, UA MANY (*)     All other components within normal limits  PREGNANCY, URINE  URINE CULTURE   Dg Chest 2 View  04/22/2012  *RADIOLOGY REPORT*  Clinical Data: Cough with chest pain and body aches. Fever.  CHEST - 2 VIEW  Comparison: 03/22/2011 radiographs.  Findings: There are slightly lower lung volumes and mild central airway thickening.  No hyperinflation, confluent airspace opacity or pleural effusion is present.  The heart size and mediastinal contours are stable.  IMPRESSION: Mild central airway thickening consistent with bronchitis or viral infection.  No evidence of pneumonia.   Original Report Authenticated By: Carey Bullocks, M.D.      1. UTI (lower urinary tract infection)   2. Bronchitis       MDM  Patient tolerated PO fluids in ed,  Also given NS IV.  Patients labs and/or radiological studies were reviewed during the medical decision making and disposition process. Uti,  Culture sent and pt started on macrobid.  She was prescribed phenergan/codeine cough syrup , encouraged rest,  Increased fluids.  Recheck for worsened sx, cough, increased fever, weakness.       Date: 04/22/2012  Rate: 101  Rhythm: sinus tachycardia  QRS Axis: normal  Intervals: normal  ST/T Wave abnormalities: normal  Conduction Disutrbances:none  Narrative Interpretation:   Old EKG Reviewed: none available    Burgess Amor, PA 04/22/12 2342

## 2012-04-22 NOTE — ED Notes (Signed)
Patient consumed ginger ale with no difficulty. No complaints of nausea, vomiting, or pain at this time.

## 2012-04-22 NOTE — ED Notes (Signed)
Gave patient ginger ale to drink as ordered by Burgess Amor PA.

## 2012-04-22 NOTE — ED Notes (Signed)
Chest pain, nausea, dizzy .cough, yellow sputum, chills

## 2012-04-22 NOTE — ED Provider Notes (Signed)
Medical screening examination/treatment/procedure(s) were performed by non-physician practitioner and as supervising physician I was immediately available for consultation/collaboration. Solveig Fangman, MD, FACEP   Orry Sigl L Suzette Flagler, MD 04/22/12 2344 

## 2012-04-24 LAB — URINE CULTURE

## 2012-04-25 NOTE — ED Notes (Signed)
+  Urine. Patient treated with Macrobid. Sensitive to same. Per protocol MD. °

## 2012-07-28 ENCOUNTER — Encounter (HOSPITAL_COMMUNITY): Payer: Self-pay

## 2012-07-28 ENCOUNTER — Emergency Department (HOSPITAL_COMMUNITY)
Admission: EM | Admit: 2012-07-28 | Discharge: 2012-07-28 | Disposition: A | Discharge status: Home / Self Care | Payer: Self-pay | Attending: Emergency Medicine | Admitting: Emergency Medicine

## 2012-07-28 ENCOUNTER — Emergency Department (HOSPITAL_COMMUNITY): Payer: Self-pay

## 2012-07-28 DIAGNOSIS — M25559 Pain in unspecified hip: Secondary | ICD-10-CM | POA: Insufficient documentation

## 2012-07-28 DIAGNOSIS — Z3202 Encounter for pregnancy test, result negative: Secondary | ICD-10-CM | POA: Insufficient documentation

## 2012-07-28 DIAGNOSIS — Z9889 Other specified postprocedural states: Secondary | ICD-10-CM | POA: Insufficient documentation

## 2012-07-28 DIAGNOSIS — M25551 Pain in right hip: Secondary | ICD-10-CM

## 2012-07-28 DIAGNOSIS — F17200 Nicotine dependence, unspecified, uncomplicated: Secondary | ICD-10-CM | POA: Insufficient documentation

## 2012-07-28 LAB — URINALYSIS, ROUTINE W REFLEX MICROSCOPIC
Bilirubin Urine: NEGATIVE
Nitrite: NEGATIVE
Protein, ur: NEGATIVE mg/dL
Specific Gravity, Urine: 1.02 (ref 1.005–1.030)
Urobilinogen, UA: 0.2 mg/dL (ref 0.0–1.0)

## 2012-07-28 LAB — BASIC METABOLIC PANEL
CO2: 28 mEq/L (ref 19–32)
Chloride: 102 mEq/L (ref 96–112)
Creatinine, Ser: 0.88 mg/dL (ref 0.50–1.10)
Glucose, Bld: 90 mg/dL (ref 70–99)
Sodium: 138 mEq/L (ref 135–145)

## 2012-07-28 LAB — CBC WITH DIFFERENTIAL/PLATELET
Basophils Absolute: 0 10*3/uL (ref 0.0–0.1)
HCT: 40.8 % (ref 36.0–46.0)
Hemoglobin: 13.6 g/dL (ref 12.0–15.0)
Lymphocytes Relative: 24 % (ref 12–46)
Lymphs Abs: 2.2 10*3/uL (ref 0.7–4.0)
MCV: 86.6 fL (ref 78.0–100.0)
Monocytes Absolute: 0.5 10*3/uL (ref 0.1–1.0)
Neutro Abs: 6.3 10*3/uL (ref 1.7–7.7)
RBC: 4.71 MIL/uL (ref 3.87–5.11)
RDW: 14.3 % (ref 11.5–15.5)
WBC: 9.2 10*3/uL (ref 4.0–10.5)

## 2012-07-28 LAB — WET PREP, GENITAL: Trich, Wet Prep: NONE SEEN

## 2012-07-28 MED ORDER — HYDROMORPHONE HCL PF 1 MG/ML IJ SOLN
1.0000 mg | Freq: Once | INTRAMUSCULAR | Status: AC
  Administered 2012-07-28: 1 mg via INTRAMUSCULAR
  Filled 2012-07-28: qty 1

## 2012-07-28 MED ORDER — NAPROXEN 500 MG PO TABS: 500.0000 mg | ORAL_TABLET | Freq: Two times a day (BID) | ORAL | Status: DC

## 2012-07-28 MED ORDER — ONDANSETRON 8 MG PO TBDP
8.0000 mg | ORAL_TABLET | Freq: Once | ORAL | Status: AC
  Administered 2012-07-28: 8 mg via ORAL
  Filled 2012-07-28: qty 1

## 2012-07-28 MED ORDER — OXYCODONE-ACETAMINOPHEN 5-325 MG PO TABS
1.0000 | ORAL_TABLET | Freq: Once | ORAL | Status: AC
  Administered 2012-07-28: 1 via ORAL
  Filled 2012-07-28: qty 1

## 2012-07-28 MED ORDER — OXYCODONE-ACETAMINOPHEN 5-325 MG PO TABS: 1.0000 | ORAL_TABLET | ORAL | Status: DC | PRN

## 2012-07-28 NOTE — ED Notes (Signed)
Patient complains of right lower quadrant abdominal pain with palpation and right hip pain with movement and bending.  Patient states that this has been going on for a couple of weeks and she figured it would just go away on its own.  States that she has had nausea at times, but no vomiting.  States that her periods are controlled by an implant, which is due for removal April of 2015.  States that she has an appointment for a Pap smear on 08/07/2012 with her OB/GYN.  States that she did have one episode of bloody vaginal discharge, which she described as a "gush" of blood approx 1-2 weeks ago.

## 2012-07-28 NOTE — ED Notes (Signed)
Flank pain right side

## 2012-07-29 LAB — GC/CHLAMYDIA PROBE AMP
CT Probe RNA: NEGATIVE
GC Probe RNA: NEGATIVE

## 2012-07-31 NOTE — ED Provider Notes (Signed)
History     CSN: [id number]  Arrival date & time 07/28/12  0810   First MD Initiated Contact with Patient 07/28/12 0820      No chief complaint on file.   (Consider location/radiation/quality/duration/timing/severity/associated sxs/prior treatment) HPI Comments: Patient is a 37 yo female who presents to the ED c/o pain to her right hip area for 3 weeks.  states the pain is worse with weight bearing and improves somewhat with rest.  Describes the pain as aching and sharp at times.  Pain is not affected by eating, urinating or intercourse.  She has tried OTC pain relief medications w/o improvement.  She denies vaginal bleeding or discharge, vomiting, abd pain, fever, injury or dysuria.    The history is provided by the patient.    History reviewed. No pertinent past medical history.  Past Surgical History  Procedure Laterality Date  . Cesarean section      No family history on file.  History  Substance Use Topics  . Smoking status: Current Some Day Smoker -- 0.50 packs/day    Types: Cigarettes  . Smokeless tobacco: Not on file  . Alcohol Use: Yes     Comment: occ    OB History   Grav Para Term Preterm Abortions TAB SAB Ect Mult Living                  Review of Systems  Constitutional: Negative for fever, chills, activity change and appetite change.  Respiratory: Negative for chest tightness and shortness of breath.   Cardiovascular: Negative for chest pain.  Gastrointestinal: Negative for nausea, vomiting, abdominal pain, diarrhea, constipation and abdominal distention.  Genitourinary: Negative for dysuria, urgency, decreased urine volume, vaginal bleeding, vaginal discharge, difficulty urinating and pelvic pain.  Musculoskeletal: Positive for joint swelling, arthralgias and gait problem. Negative for back pain.  Skin: Negative for color change and wound.  All other systems reviewed and are negative.    Allergies  Keflex  Home Medications   Current Outpatient  Rx  Name  Route  Sig  Dispense  Refill  . acetaminophen (TYLENOL) 650 MG CR tablet   Oral   Take 650 mg by mouth every 8 (eight) hours as needed for pain.         Marland Kitchen etonogestrel (IMPLANON) 68 MG IMPL implant   Subcutaneous   Inject 1 each into the skin once.         Marland Kitchen ibuprofen (ADVIL,MOTRIN) 200 MG tablet   Oral   Take 800 mg by mouth every 6 (six) hours as needed for pain.         . naproxen (NAPROSYN) 500 MG tablet   Oral   Take 1 tablet (500 mg total) by mouth 2 (two) times daily with a meal.   20 tablet   0   . oxyCODONE-acetaminophen (PERCOCET/ROXICET) 5-325 MG per tablet   Oral   Take 1 tablet by mouth every 4 (four) hours as needed for pain.   20 tablet   0     BP 124/71  Pulse 84  Temp(Src) 98.5 F (36.9 C)  Resp 20  Ht 5\' 5"  (1.651 m)  Wt 401 lb (181.892 kg)  BMI 66.73 kg/m2  SpO2 92%  Physical Exam  Nursing note and vitals reviewed. Constitutional: She is oriented to person, place, and time. She appears well-developed and well-nourished. No distress.  Patient is morbidly obese, but well appearing  HENT:  Head: Normocephalic and atraumatic.  Mouth/Throat: Oropharynx is clear and moist.  Cardiovascular: Normal rate, regular rhythm, normal heart sounds and intact distal pulses.   No murmur heard. Pulmonary/Chest: Effort normal and breath sounds normal. No respiratory distress. She exhibits no tenderness.  Abdominal: Soft. Bowel sounds are normal. She exhibits no distension and no mass. There is tenderness. There is no rigidity, no rebound, no guarding, no CVA tenderness and no tenderness at McBurney's point.    Genitourinary: Cervix exhibits no motion tenderness and no discharge. Right adnexum displays no mass and no tenderness. Left adnexum displays no mass and no tenderness.  Patient is NT on bimanual exam.  No gross abnml or masses palpated.  Exam is limited due to body habitus.  No vaginal discharge or bleeding  Musculoskeletal: She exhibits no  edema.  Patient has full ROM of the right hip, distal sensation intact.  DP pulse brisk and symmetrical.    Neurological: She is alert and oriented to person, place, and time. She exhibits normal muscle tone. Coordination normal.  Skin: Skin is warm and dry.    ED Course  Procedures (including critical care time)  Labs Reviewed  WET PREP, GENITAL - Abnormal; Notable for the following:    Clue Cells Wet Prep HPF POC FEW (*)    WBC, Wet Prep HPF POC FEW (*)    All other components within normal limits  BASIC METABOLIC PANEL - Abnormal; Notable for the following:    GFR calc non Af Amer 84 (*)    All other components within normal limits  GC/CHLAMYDIA PROBE AMP  URINALYSIS, ROUTINE W REFLEX MICROSCOPIC  PREGNANCY, URINE  CBC WITH DIFFERENTIAL   US Transvaginal Non-ob  07/28/2012  *RADIOLOGY REPORT*  Clinical Data: Right pelvic pain.  TRANSABDOMINAL AND TRANSVAGINAL ULTRASOUND OF PELVIS Technique:  Both transabdominal and transvaginal ultrasound examinations of the pelvis were performed. Transabdominal technique was performed for global imaging of the pelvis including uterus, ovaries, adnexal regions, and pelvic cul-de-sac.  It was necessary to proceed with endovaginal exam following the transabdominal exam to visualize the uterus, endometrium and adnexal regions.  Comparison:  None.  Findings:  Uterus: Measures 9.5 x 5.2 x 5.4 cm.  There are two hypoechoic structures in the posterior lower uterine segment and/or high cervix, measuring 1.0 x 0.6 x 1.1 cm and 0.6 x 0.7 x 0.7 cm, respectively.  Endometrium: Measures 2 mm, within normal limits.  Right ovary:  Measures 3.1 x 2.2 x 2.9 cm, negative.  Left ovary: Measures 3.3 x 2.5 x 2.6 cm, negative.  Other findings: No free fluid.  IMPRESSION: Probable small fibroids and/or complex Nabothian   cysts.   Original Report Authenticated By: Leanna Battles, M.D.    US Pelvis Complete  07/28/2012  *RADIOLOGY REPORT*  Clinical Data: Right pelvic pain.   TRANSABDOMINAL AND TRANSVAGINAL ULTRASOUND OF PELVIS Technique:  Both transabdominal and transvaginal ultrasound examinations of the pelvis were performed. Transabdominal technique was performed for global imaging of the pelvis including uterus, ovaries, adnexal regions, and pelvic cul-de-sac.  It was necessary to proceed with endovaginal exam following the transabdominal exam to visualize the uterus, endometrium and adnexal regions.  Comparison:  None.  Findings:  Uterus: Measures 9.5 x 5.2 x 5.4 cm.  There are two hypoechoic structures in the posterior lower uterine segment and/or high cervix, measuring 1.0 x 0.6 x 1.1 cm and 0.6 x 0.7 x 0.7 cm, respectively.  Endometrium: Measures 2 mm, within normal limits.  Right ovary:  Measures 3.1 x 2.2 x 2.9 cm, negative.  Left ovary: Measures 3.3 x 2.5  x 2.6 cm, negative.  Other findings: No free fluid.  IMPRESSION: Probable small fibroids and/or complex Nabothian   cysts.   Original Report Authenticated By: Leanna Battles, M.D.     1. Pelvic joint pain, right       MDM     Labs, [place] results reviewed and wnml except small uterine fibroids present.  Results discussed with patient.  She is alert, well appearing.  Discussed pt hx and care plan with the EDP.  Pt has appt later this week with her GYN.    Will treat her symptomatically with naprosyn and percocet.  She agrees to return here if her sx's worsen  The patient appears reasonably screened and/or stabilized for discharge and I doubt any other medical condition or other [facility] requiring further screening, evaluation, or treatment in the ED at this time prior to discharge.       Jniyah Dantuono L. Trisha Mangle, PA-C 07/31/12 1211

## 2012-08-01 NOTE — ED Provider Notes (Signed)
Medical screening examination/treatment/procedure(s) were conducted as a shared visit with non-physician practitioner(s) and myself.  I personally evaluated the patient during the encounter.   No acute abdomen. Pt has primary care f/u  Donnetta Hutching, MD 08/01/12 1116

## 2012-08-05 ENCOUNTER — Encounter: Payer: Self-pay | Admitting: *Deleted

## 2012-08-05 DIAGNOSIS — B009 Herpesviral infection, unspecified: Secondary | ICD-10-CM

## 2012-08-05 DIAGNOSIS — Z8619 Personal history of other infectious and parasitic diseases: Secondary | ICD-10-CM

## 2012-08-05 DIAGNOSIS — E669 Obesity, unspecified: Secondary | ICD-10-CM | POA: Insufficient documentation

## 2012-08-09 ENCOUNTER — Other Ambulatory Visit: Payer: Self-pay | Admitting: Adult Health

## 2012-08-09 ENCOUNTER — Encounter: Payer: Self-pay | Admitting: Adult Health

## 2012-08-09 ENCOUNTER — Ambulatory Visit (INDEPENDENT_AMBULATORY_CARE_PROVIDER_SITE_OTHER): Payer: BC Managed Care – PPO | Admitting: Adult Health

## 2012-08-09 ENCOUNTER — Other Ambulatory Visit (HOSPITAL_COMMUNITY)
Admission: RE | Admit: 2012-08-09 | Discharge: 2012-08-09 | Disposition: A | Discharge status: Home / Self Care | Payer: Medicaid Other | Source: Ambulatory Visit | Attending: Adult Health | Admitting: Adult Health

## 2012-08-09 VITALS — BP 120/88 | HR 80 | Ht 66.0 in | Wt >= 6400 oz

## 2012-08-09 DIAGNOSIS — Z0000 Encounter for general adult medical examination without abnormal findings: Secondary | ICD-10-CM

## 2012-08-09 DIAGNOSIS — Z01419 Encounter for gynecological examination (general) (routine) without abnormal findings: Secondary | ICD-10-CM

## 2012-08-09 DIAGNOSIS — E669 Obesity, unspecified: Secondary | ICD-10-CM

## 2012-08-09 DIAGNOSIS — M25551 Pain in right hip: Secondary | ICD-10-CM

## 2012-08-09 DIAGNOSIS — Z113 Encounter for screening for infections with a predominantly sexual mode of transmission: Secondary | ICD-10-CM | POA: Insufficient documentation

## 2012-08-09 DIAGNOSIS — Z1151 Encounter for screening for human papillomavirus (HPV): Secondary | ICD-10-CM | POA: Insufficient documentation

## 2012-08-09 HISTORY — DX: Pain in right hip: M25.551

## 2012-08-09 NOTE — Patient Instructions (Addendum)
Sign up for my chart, try nsaids for hip pain, see Dr. Romeo Apple if not better. Follow up 1 year for physical. Consider by pass surgery Try 1800 calorie diet              1800 Calorie Diet for Diabetes Meal Planning The 1800 calorie diet is designed for eating up to 1800 calories each day. Following this diet and making healthy meal choices can help improve overall health. This diet controls blood sugar (glucose) levels and can also help lower blood pressure and cholesterol. SERVING SIZES Measuring foods and serving sizes helps to make sure you are getting the right amount of food. The list below tells how big or small some common serving sizes are:  1 oz.........4 stacked dice.  3 oz........Marland KitchenDeck of cards.  1 tsp.......Marland KitchenTip of little finger.  1 tbs......Marland KitchenMarland KitchenThumb.  2 tbs.......Marland KitchenGolf ball.   cup......Marland KitchenHalf of a fist.  1 cup.......Marland KitchenA fist. GUIDELINES FOR CHOOSING FOODS The goal of this diet is to eat a variety of foods and limit calories to 1800 each day. This can be done by choosing foods that are low in calories and fat. The diet also suggests eating small amounts of food frequently. Doing this helps control your blood glucose levels so they do not get too high or too low. Each meal or snack may include a protein food source to help you feel more satisfied and to stabilize your blood glucose. Try to eat about the same amount of food around the same time each day. This includes weekend days, travel days, and days off work. Space your meals about 4 to 5 hours apart and add a snack between them if you wish.  For example, a daily food plan could include breakfast, a morning snack, lunch, dinner, and an evening snack. Healthy meals and snacks include whole grains, vegetables, fruits, lean meats, poultry, fish, and dairy products. As you plan your meals, select a variety of foods. Choose from the bread and starch, vegetable, fruit, dairy, and meat/protein groups. Examples of foods from each group and  their suggested serving sizes are listed below. Use measuring cups and spoons to become familiar with what a healthy portion looks like. Bread and Starch Each serving equals 15 grams of carbohydrates.  1 slice bread.   bagel.   cup cold cereal (unsweetened).   cup hot cereal or mashed potatoes.  1 small potato (size of a computer mouse).   cup cooked pasta or rice.   English muffin.  1 cup broth-based soup.  3 cups of popcorn.  4 to 6 whole-wheat crackers.   cup cooked beans, peas, or corn. Vegetable Each serving equals 5 grams of carbohydrates.   cup cooked vegetables.  1 cup raw vegetables.   cup tomato or vegetable juice. Fruit Each serving equals 15 grams of carbohydrates.  1 small apple or orange.  1 cup watermelon or strawberries.   cup applesauce (no sugar added).  2 tbs raisins.   banana.   cup canned fruit, packed in water, its own juice, or sweetened with a sugar substitute.   cup unsweetened fruit juice. Dairy Each serving equals 12 to 15 grams of carbohydrates.  1 cup fat-free milk.  6 oz artificially sweetened yogurt or plain yogurt.  1 cup low-fat buttermilk.  1 cup soy milk.  1 cup almond milk. Meat/Protein  1 large egg.  2 to 3 oz meat, poultry, or fish.   cup low-fat cottage cheese.  1 tbs peanut butter.  1 oz low-fat  cheese.   cup tuna in water.   cup tofu. Fat  1 tsp oil.  1 tsp trans-fat-free margarine.  1 tsp butter.  1 tsp mayonnaise.  2 tbs avocado.  1 tbs salad dressing.  1 tbs cream cheese.  2 tbs sour cream. SAMPLE 1800 CALORIE DIET PLAN Breakfast   cup unsweetened cereal (1 carb serving).  1 cup fat-free milk (1 carb serving).  1 slice whole-wheat toast (1 carb serving).   small banana (1 carb serving).  1 scrambled egg.  1 tsp trans-fat-free margarine. Lunch  Tuna sandwich.  2 slices whole-wheat bread (2 carb servings).   cup canned tuna in water,  drained.  1 tbs reduced fat mayonnaise.  1 stalk celery, chopped.  2 slices tomato.  1 lettuce leaf.  1 cup carrot sticks.  24 to 30 seedless grapes (2 carb servings).  6 oz light yogurt (1 carb serving). Afternoon Snack  3 graham cracker squares (1 carb serving).  Fat-free milk, 1 cup (1 carb serving).  1 tbs peanut butter. Dinner  3 oz salmon, broiled with 1 tsp oil.  1 cup mashed potatoes (2 carb servings) with 1 tsp trans-fat-free margarine.  1 cup fresh or frozen green beans.  1 cup steamed asparagus.  1 cup fat-free milk (1 carb serving). Evening Snack  3 cups air-popped popcorn (1 carb serving).  2 tbs parmesan cheese sprinkled on top. MEAL PLAN Use this worksheet to help you make a daily meal plan based on the 1800 calorie diet suggestions. If you are using this plan to help you control your blood glucose, you may interchange carbohydrate-containing foods (dairy, starches, and fruits). Select a variety of fresh foods of varying colors and flavors. The total amount of carbohydrate in your meals or snacks is more important than making sure you include all of the food groups every time you eat. Choose from the following foods to build your day's meals:  8 Starches.  4 Vegetables.  3 Fruits.  2 Dairy.  6 to 7 oz Meat/Protein.  Up to 4 Fats. Your dietician can use this worksheet to help you decide how many servings and which types of foods are right for you. BREAKFAST Food Group and Servings / Food Choice Starch ________________________________________________________ Dairy _________________________________________________________ Fruit _________________________________________________________ Meat/Protein __________________________________________________ Fat ___________________________________________________________ LUNCH Food Group and Servings / Food Choice Starch ________________________________________________________ Meat/Protein  __________________________________________________ Vegetable _____________________________________________________ Fruit _________________________________________________________ Dairy _________________________________________________________ Fat ___________________________________________________________ Aura Fey Food Group and Servings / Food Choice Starch ________________________________________________________ Meat/Protein __________________________________________________ Fruit __________________________________________________________ Dairy _________________________________________________________ Laural Golden Food Group and Servings / Food Choice Starch _________________________________________________________ Meat/Protein ___________________________________________________ Dairy __________________________________________________________ Vegetable ______________________________________________________ Fruit ___________________________________________________________ Fat ____________________________________________________________ Lollie Sails Food Group and Servings / Food Choice Fruit __________________________________________________________ Meat/Protein ___________________________________________________ Dairy __________________________________________________________ Starch _________________________________________________________ DAILY TOTALS Starch ____________________________ Vegetable _________________________ Fruit _____________________________ Dairy _____________________________ Meat/Protein______________________ Fat _______________________________ Document Released: 10/28/2004 Document Revised: 06/30/2011 Document Reviewed: 02/21/2011 [employer] Patient Information 2013 [place], [place].

## 2012-08-09 NOTE — Progress Notes (Signed)
Patient ID: Amy [name], female   DOB: [birth year], 37 y.o.   MRN: [id number] History of Present Illness: Amy [name] is a 37 year old black female in for a family planning pap and physical.   Current Medications, Allergies, Past Medical History, Past Surgical History, Family History and Social History were reviewed in [employer] electronic medical record.    Review of Systems: Patient denies any headaches, blurred vision, shortness of breath, chest pain, abdominal pain, problems with bowel movements, urination, or intercourse. Amy [name] does have pain in right hip area.Amy [name] was seen in ER 07/28/12 for pain in right side and had an ultrasound that showed 2 small uterine fibroids. No mood changes. Amy [name] does say her back hurts at times and Amy [name] thinks it has to do with her stomach being so big.  Physical Exam: Blood pressure 120/88, pulse 80, height 5\' 6"  (1.676 m), weight 417 lb 6.4 oz (189.331 kg). Amy [name] does not have periods secondary to implanon. General:  Well developed, well nourished, no acute distress Skin:  Warm and dry, the implanon is palpated in left arm and is good til 12/2013. Neck:  Midline trachea, normal thyroid Lungs; Clear to auscultation bilaterally Breast:  No dominant palpable mass, retraction, or nipple discharge Cardiovascular: Regular rate and rhythm Abdomen:  Soft, non tender, no hepatosplenomegaly and morbidly obese. Pelvic:  External genitalia is normal in appearance.  The vagina is normal in appearance. The cervix is bulbous. Pap performed with HPV and GC/CHL.  Uterus is felt to be normal size, shape, and contour.  No adnexal masses or tenderness noted. Extremities:  No swelling or varicosities noted, Amy [name] does have tenderness at right hip in the bursa area. Psych:  No mood changes, alert and cooperative.  Impression: Family Planning Medicaid Yearly Exam, STD testing,contraceptive counselling Pain right hip Obesity  Small uterine fibroids  Plan: Check HIV, RPR, Hepatitis  B and C antibodies  Take NSAIDS for right hip pain, if not better, see Dr. Romeo Apple Try 1800 calorie diet and consider by- pass surgery Follow up 1 year for physical Remove implanon 12/2013

## 2012-08-09 NOTE — Assessment & Plan Note (Signed)
Arthritis like pain try nsaids

## 2013-06-18 ENCOUNTER — Emergency Department (HOSPITAL_COMMUNITY)
Admission: EM | Admit: 2013-06-18 | Discharge: 2013-06-18 | Disposition: A | Discharge status: Home / Self Care | Payer: BC Managed Care – PPO | Attending: Emergency Medicine | Admitting: Emergency Medicine

## 2013-06-18 ENCOUNTER — Encounter (HOSPITAL_COMMUNITY): Payer: Self-pay | Admitting: Emergency Medicine

## 2013-06-18 DIAGNOSIS — M545 Low back pain, unspecified: Secondary | ICD-10-CM | POA: Insufficient documentation

## 2013-06-18 DIAGNOSIS — F17200 Nicotine dependence, unspecified, uncomplicated: Secondary | ICD-10-CM | POA: Insufficient documentation

## 2013-06-18 DIAGNOSIS — Z791 Long term (current) use of non-steroidal anti-inflammatories (NSAID): Secondary | ICD-10-CM | POA: Insufficient documentation

## 2013-06-18 DIAGNOSIS — Z8619 Personal history of other infectious and parasitic diseases: Secondary | ICD-10-CM | POA: Insufficient documentation

## 2013-06-18 DIAGNOSIS — E669 Obesity, unspecified: Secondary | ICD-10-CM | POA: Insufficient documentation

## 2013-06-18 DIAGNOSIS — M549 Dorsalgia, unspecified: Secondary | ICD-10-CM

## 2013-06-18 DIAGNOSIS — Z79899 Other long term (current) drug therapy: Secondary | ICD-10-CM | POA: Insufficient documentation

## 2013-06-18 LAB — URINALYSIS, ROUTINE W REFLEX MICROSCOPIC
BILIRUBIN URINE: NEGATIVE
Glucose, UA: NEGATIVE mg/dL
HGB URINE DIPSTICK: NEGATIVE
KETONES UR: NEGATIVE mg/dL
Leukocytes, UA: NEGATIVE
NITRITE: NEGATIVE
PROTEIN: NEGATIVE mg/dL
SPECIFIC GRAVITY, URINE: 1.025 (ref 1.005–1.030)
UROBILINOGEN UA: 0.2 mg/dL (ref 0.0–1.0)
pH: 6.5 (ref 5.0–8.0)

## 2013-06-18 LAB — BASIC METABOLIC PANEL
BUN: 11 mg/dL (ref 6–23)
CALCIUM: 9.2 mg/dL (ref 8.4–10.5)
CO2: 32 mEq/L (ref 19–32)
Chloride: 102 mEq/L (ref 96–112)
Creatinine, Ser: 0.85 mg/dL (ref 0.50–1.10)
GFR calc non Af Amer: 87 mL/min — ABNORMAL LOW (ref >90)
GLUCOSE: 95 mg/dL (ref 70–99)
Potassium: 4.5 mEq/L (ref 3.7–5.3)
Sodium: 141 mEq/L (ref 137–147)

## 2013-06-18 MED ORDER — PREDNISONE 10 MG PO TABS: ORAL_TABLET | ORAL | Status: DC

## 2013-06-18 MED ORDER — OXYCODONE-ACETAMINOPHEN 5-325 MG PO TABS
2.0000 | ORAL_TABLET | Freq: Once | ORAL | Status: AC
  Administered 2013-06-18: 2 via ORAL
  Filled 2013-06-18: qty 2

## 2013-06-18 MED ORDER — OXYCODONE-ACETAMINOPHEN 5-325 MG PO TABS: 1.0000 | ORAL_TABLET | ORAL | Status: DC | PRN

## 2013-06-18 NOTE — ED Provider Notes (Signed)
Medical screening examination/treatment/procedure(s) were performed by non-physician practitioner and as supervising physician I was immediately available for consultation/collaboration.   EKG Interpretation None        Maudry Diego, MD 06/18/13 [phone]

## 2013-06-18 NOTE — ED Notes (Signed)
Pt states right lower back and hip pain ongoing x 1 year. States severe burning sensation to area at times. States she had multiple tests on hip a year ago and nothing was found (xray and ultrasound of hip). PMD is aware and did not order any further testing. Pt states pain continues and wants to know what is causing it.

## 2013-06-18 NOTE — Discharge Instructions (Signed)
Back Pain, Adult Low back pain is very common. About 1 in 5 people have back pain.The cause of low back pain is rarely dangerous. The pain often gets better over time.About half of people with a sudden onset of back pain feel better in just 2 weeks. About 8 in 10 people feel better by 6 weeks.  CAUSES Some common causes of back pain include:  Strain of the muscles or ligaments supporting the spine.  Wear and tear (degeneration) of the spinal discs.  Arthritis.  Direct injury to the back. DIAGNOSIS Most of the time, the direct cause of low back pain is not known.However, back pain can be treated effectively even when the exact cause of the pain is unknown.Answering your caregiver's questions about your overall health and symptoms is one of the most accurate ways to make sure the cause of your pain is not dangerous. If your caregiver needs more information, he or she may order lab work or imaging tests (X-rays or MRIs).However, even if imaging tests show changes in your back, this usually does not require surgery. HOME CARE INSTRUCTIONS For many people, back pain returns.Since low back pain is rarely dangerous, it is often a condition that people can learn to manageon their own.   Remain active. It is stressful on the back to sit or stand in one place. Do not sit, drive, or stand in one place for more than 30 minutes at a time. Take short walks on level surfaces as soon as pain allows.Try to increase the length of time you walk each day.  Do not stay in bed.Resting more than 1 or 2 days can delay your recovery.  Do not avoid exercise or work.Your body is made to move.It is not dangerous to be active, even though your back may hurt.Your back will likely heal faster if you return to being active before your pain is gone.  Pay attention to your body when you bend and lift. Many people have less discomfortwhen lifting if they bend their knees, keep the load close to their bodies,and  avoid twisting. Often, the most comfortable positions are those that put less stress on your recovering back.  Find a comfortable position to sleep. Use a firm mattress and lie on your side with your knees slightly bent. If you lie on your back, put a pillow under your knees.  Only take over-the-counter or prescription medicines as directed by your caregiver. Over-the-counter medicines to reduce pain and inflammation are often the most helpful.Your caregiver may prescribe muscle relaxant drugs.These medicines help dull your pain so you can more quickly return to your normal activities and healthy exercise.  Put ice on the injured area.  Put ice in a plastic bag.  Place a towel between your skin and the bag.  Leave the ice on for 15-20 minutes, 03-04 times a day for the first 2 to 3 days. After that, ice and heat may be alternated to reduce pain and spasms.  Ask your caregiver about trying back exercises and gentle massage. This may be of some benefit.  Avoid feeling anxious or stressed.Stress increases muscle tension and can worsen back pain.It is important to recognize when you are anxious or stressed and learn ways to manage it.Exercise is a great option. SEEK MEDICAL CARE IF:  You have pain that is not relieved with rest or medicine.  You have pain that does not improve in 1 week.  You have new symptoms.  You are generally not feeling well. SEEK   IMMEDIATE MEDICAL CARE IF:   You have pain that radiates from your back into your legs.  You develop new bowel or bladder control problems.  You have unusual weakness or numbness in your arms or legs.  You develop nausea or vomiting.  You develop abdominal pain.  You feel faint. Document Released: 04/07/2005 Document Revised: 10/07/2011 Document Reviewed: 08/26/2010 [employer] Patient Information 2014 [employer].  

## 2013-06-18 NOTE — ED Provider Notes (Signed)
CSN: [id number]     Arrival date & time 06/18/13  1628 History   First MD Initiated Contact with Patient 06/18/13 1638     Chief Complaint  Patient presents with  . Back Pain     (Consider location/radiation/quality/duration/timing/severity/associated sxs/prior Treatment) HPI Comments: Patient here today with pain in her right lower back that has occurred constantly for the past month. She describes the pain as a burning pain, that is worse at times, but is not attributed to movement. She has tried taking ibuprofen and has applied icyhot with little relief. Blood was spotted on toilet paper after urinating on two occasions in the last week. She believed the blood came from the rectum rather than urethra, but had not defecated on either of the two occasions. Urinary urgency also noted at times. She denies fever, dysuria, increased urinary frequency, loss of bladder or bowel control, or rash in area of pain.   Patient is a 38 y.o. female presenting with back pain. The history is provided by the patient.  Back Pain Associated symptoms: no dysuria and no fever     Past Medical History  Diagnosis Date  . Hx of chlamydia infection   . HSV-2 (herpes simplex virus 2) infection   . Obesity   . Pain in right hip 08/09/2012   Past Surgical History  Procedure Laterality Date  . Cesarean section     Family History  Problem Relation Age of Onset  . Hypertension Mother   . Diabetes Mother   . COPD Mother   . Hypertension Sister   . Cancer Maternal Grandfather   . Allergies Son    History  Substance Use Topics  . Smoking status: Current Some Day Smoker -- 0.50 packs/day    Types: Cigarettes  . Smokeless tobacco: Never Used  . Alcohol Use: Yes     Comment: occassionally   OB History   Grav Para Term Preterm Abortions TAB SAB Ect Mult Living   1 1 1       1      Review of Systems  Constitutional: Negative for fever.  Respiratory: Negative for shortness of breath.   Gastrointestinal:  Negative for nausea and vomiting.  Endocrine: Negative for polyuria.  Genitourinary: Negative for dysuria, frequency, decreased urine volume and difficulty urinating.       See HPI  Musculoskeletal: Positive for back pain.      Allergies  Keflex  Home Medications   Current Outpatient Rx  Name  Route  Sig  Dispense  Refill  . etonogestrel (IMPLANON) 68 MG IMPL implant   Subcutaneous   Inject 1 each into the skin once.         Marland Kitchen ibuprofen (ADVIL,MOTRIN) 200 MG tablet   Oral   Take 800 mg by mouth every 6 (six) hours as needed for pain.         . naproxen (NAPROSYN) 500 MG tablet   Oral   Take 1 tablet (500 mg total) by mouth 2 (two) times daily with a meal.   20 tablet   0   . oxyCODONE-acetaminophen (PERCOCET/ROXICET) 5-325 MG per tablet   Oral   Take 1 tablet by mouth every 4 (four) hours as needed for pain.   20 tablet   0    BP 147/99  Pulse 98  Temp(Src) 97.7 F (36.5 C) (Oral)  Ht 5\' 4"  (1.626 m)  Wt 438 lb 12.8 oz (199.038 kg)  BMI 75.28 kg/m2  SpO2 97% Physical Exam  Constitutional:  She appears well-developed and well-nourished.  Abdominal: Soft. She exhibits no distension. There is no tenderness. There is no rebound and no guarding.  Musculoskeletal: Normal range of motion. She exhibits tenderness.  Tenderness noted in right lower back with moderate palpation.   Skin: No rash noted. No erythema.  Back without erythema, edema, or lesion.    ED Course  Procedures (including critical care time) Labs Review Labs Reviewed  URINALYSIS, ROUTINE W REFLEX MICROSCOPIC  BASIC METABOLIC PANEL   Results for orders placed during the hospital encounter of 06/18/13  URINALYSIS, ROUTINE W REFLEX MICROSCOPIC      Result Value Ref Range   Color, Urine YELLOW  YELLOW   APPearance CLEAR  CLEAR   Specific Gravity, Urine 1.025  1.005 - 1.030   pH 6.5  5.0 - 8.0   Glucose, UA NEGATIVE  NEGATIVE mg/dL   Hgb urine dipstick NEGATIVE  NEGATIVE   Bilirubin Urine  NEGATIVE  NEGATIVE   Ketones, ur NEGATIVE  NEGATIVE mg/dL   Protein, ur NEGATIVE  NEGATIVE mg/dL   Urobilinogen, UA 0.2  0.0 - 1.0 mg/dL   Nitrite NEGATIVE  NEGATIVE   Leukocytes, UA NEGATIVE  NEGATIVE  BASIC METABOLIC PANEL      Result Value Ref Range   Sodium 141  137 - 147 mEq/L   Potassium 4.5  3.7 - 5.3 mEq/L   Chloride 102  96 - 112 mEq/L   CO2 32  19 - 32 mEq/L   Glucose, Bld 95  70 - 99 mg/dL   BUN 11  6 - 23 mg/dL   Creatinine, Ser 0.85  0.50 - 1.10 mg/dL   Calcium 9.2  8.4 - 10.5 mg/dL   GFR calc non Af Amer 87 (*) >90 mL/min   GFR calc Af Amer >90  >90 mL/min    Imaging Review No results found.   EKG Interpretation None      MDM   Final diagnoses:  None    1. Back pain  Pain is some better with Percocet, burning continues. UA and renal functions WNL. Suspect pain that either musculoskeletal or related to neuropathic pain. Stable for PCP follow up.    Dewaine Oats, PA-C 06/18/13 1823

## 2013-07-27 ENCOUNTER — Encounter (INDEPENDENT_AMBULATORY_CARE_PROVIDER_SITE_OTHER): Payer: Self-pay | Admitting: *Deleted

## 2013-07-27 ENCOUNTER — Encounter (HOSPITAL_COMMUNITY): Payer: Self-pay | Admitting: Emergency Medicine

## 2013-07-27 ENCOUNTER — Emergency Department (HOSPITAL_COMMUNITY)
Admission: EM | Admit: 2013-07-27 | Discharge: 2013-07-27 | Disposition: A | Discharge status: Home / Self Care | Payer: BC Managed Care – PPO | Attending: Emergency Medicine | Admitting: Emergency Medicine

## 2013-07-27 DIAGNOSIS — E669 Obesity, unspecified: Secondary | ICD-10-CM | POA: Insufficient documentation

## 2013-07-27 DIAGNOSIS — F17200 Nicotine dependence, unspecified, uncomplicated: Secondary | ICD-10-CM | POA: Insufficient documentation

## 2013-07-27 DIAGNOSIS — K625 Hemorrhage of anus and rectum: Secondary | ICD-10-CM | POA: Insufficient documentation

## 2013-07-27 DIAGNOSIS — Z8619 Personal history of other infectious and parasitic diseases: Secondary | ICD-10-CM | POA: Insufficient documentation

## 2013-07-27 NOTE — Discharge Instructions (Signed)
Metamucil: 1 heaping teaspoon in a glass of water 3 times daily. Colace (equate stool softener): 100 mg twice daily.  Follow up with Dr. Laural Golden to discuss a colonoscopy.  His contact information has been provided on this discharge summary.  Return to the ER if you develop worsening bleeding, high fever, dizziness, or other new and concerning symptoms.  Rectal Bleeding Rectal bleeding is when blood passes out of the anus. It is usually a sign that something is wrong. It may not be serious, but it should always be evaluated. Rectal bleeding may present as bright red blood or extremely dark stools. The color may range from dark red or maroon to black (like tar). It is important that the cause of rectal bleeding be identified so treatment can be started and the problem corrected. CAUSES   Hemorrhoids. These are enlarged (dilated) blood vessels or veins in the anal or rectal area.  Fistulas. Theseare abnormal, burrowing channels that usually run from inside the rectum to the skin around the anus. They can bleed.  Anal fissures. This is a tear in the tissue of the anus. Bleeding occurs with bowel movements.  Diverticulosis. This is a condition in which pockets or sacs project from the bowel wall. Occasionally, the sacs can bleed.  Diverticulitis. Thisis an infection involving diverticulosis of the colon.  Proctitis and colitis. These are conditions in which the rectum, colon, or both, can become inflamed and pitted (ulcerated).  Polyps and cancer. Polyps are non-cancerous (benign) growths in the colon that may bleed. Certain types of polyps turn into cancer.  Protrusion of the rectum. Part of the rectum can project from the anus and bleed.  Certain medicines.  Intestinal infections.  Blood vessel abnormalities. HOME CARE INSTRUCTIONS  Eat a high-fiber diet to keep your stool soft.  Limit activity.  Drink enough fluids to keep your urine clear or pale yellow.  Warm baths may be  useful to soothe rectal pain.  Follow up with your caregiver as directed. SEEK IMMEDIATE MEDICAL CARE IF:  You develop increased bleeding.  You have black or dark red stools.  You vomit blood or material that looks like coffee grounds.  You have abdominal pain or tenderness.  You have a fever.  You feel weak, nauseous, or you faint.  You have severe rectal pain or you are unable to have a bowel movement. MAKE SURE YOU:  Understand these instructions.  Will watch your condition.  Will get help right away if you are not doing well or get worse. Document Released: 09/27/2001 Document Revised: 06/30/2011 Document Reviewed: 09/22/2010 [facility] Patient Information 2014 [place], [place].

## 2013-07-27 NOTE — ED Provider Notes (Signed)
CSN: [id number]     Arrival date & time 07/27/13  1411 History  This chart was scribed for Veryl Speak, MD by Zettie Pho, ED Scribe. This patient was seen in room APA05/APA05 and the patient's care was started at 2:34 PM.    Chief Complaint  Patient presents with  . Rectal Bleeding   The history is provided by the patient. No language interpreter was used.   HPI Comments: Amy [name] is a 38 y.o. Female with a history of herpes simplex virus 2 and obesity who presents to the Emergency Department complaining of bright red rectal bleeding onset around 11 hours ago. Patient reports that she had a BM this morning that was normal and that that the bleeding did not present at that time. She states that she will feel "moist," so she will go to the bathroom to wipe, which is when she notices the blood. She states that she had similar symptoms about 2 months ago, which resolved on its own, but she was not evaluated for it. She denies abdominal pain, rectal pain, constipation. Patient has an allergy to Keflex. Patient has a history of cesarean section. She states that she uses Implanon for birth control and has not had a menstrual period in about 3 years. Patient has no other pertinent medical history.   Past Medical History  Diagnosis Date  . Hx of chlamydia infection   . HSV-2 (herpes simplex virus 2) infection   . Obesity   . Pain in right hip 08/09/2012   Past Surgical History  Procedure Laterality Date  . Cesarean section     Family History  Problem Relation Age of Onset  . Hypertension Mother   . Diabetes Mother   . COPD Mother   . Hypertension Sister   . Cancer Maternal Grandfather   . Allergies Son    History  Substance Use Topics  . Smoking status: Current Some Day Smoker -- 0.50 packs/day    Types: Cigarettes  . Smokeless tobacco: Never Used  . Alcohol Use: Yes     Comment: occassionally   OB History   Grav Para Term Preterm Abortions TAB SAB Ect Mult Living   1 1 1        1      Review of Systems  A complete 10 system review of systems was obtained and all systems are negative except as noted in the HPI and PMH.    Allergies  Keflex  Home Medications   Current Outpatient Rx  Name  Route  Sig  Dispense  Refill  . etonogestrel (IMPLANON) 68 MG IMPL implant   Subcutaneous   Inject 1 each into the skin once.         Marland Kitchen ibuprofen (ADVIL,MOTRIN) 200 MG tablet   Oral   Take 800 mg by mouth every 6 (six) hours as needed for pain.         Marland Kitchen oxyCODONE-acetaminophen (PERCOCET/ROXICET) 5-325 MG per tablet   Oral   Take 1-2 tablets by mouth every 4 (four) hours as needed for severe pain.   15 tablet   0   . predniSONE (DELTASONE) 10 MG tablet      Take 6 tablets days 1 and 2; Take 5 tablets days 3 and 4; Take 4 tablets days 5 and 6; Take 3 tablets days 7 and 8; Take 2 tablets days 9 and 10; Take 1 tablet days 11 and 12   42 tablet   0    Triage Vitals:  BP 137/97  Pulse 100  Temp(Src) 97.9 F (36.6 C) (Oral)  Resp 18  Ht 5\' 4"  (1.626 m)  Wt 405 lb (183.707 kg)  BMI 69.48 kg/m2  SpO2 98%  Physical Exam  Nursing note and vitals reviewed. Constitutional: She is oriented to person, place, and time. She appears well-developed and well-nourished. No distress.  HENT:  Head: Normocephalic and atraumatic.  Eyes: Conjunctivae are normal.  Neck: Normal range of motion. Neck supple.  Pulmonary/Chest: Effort normal. No respiratory distress.  Abdominal: She exhibits no distension.  Genitourinary: Rectal exam shows no external hemorrhoid, no fissure, no mass, no tenderness and anal tone normal. Guaiac positive stool.  Exam was performed with pt's permission. Chaperone (scribe) was present. The exam was performed with no discomfort or complications.   Heme positive stool.    Musculoskeletal: Normal range of motion.  Neurological: She is alert and oriented to person, place, and time.  Skin: Skin is warm and dry.  Psychiatric: She has a normal  mood and affect. Her behavior is normal.    ED Course  Procedures (including critical care time)  DIAGNOSTIC STUDIES: Oxygen Saturation is 98% on room air, normal by my interpretation.    COORDINATION OF CARE: 2:41 PM- Performed rectal exam and guaiac testing. Discussed treatment plan with patient at bedside and patient verbalized agreement.     Labs Review Labs Reviewed - No data to display Imaging Review No results found.   EKG Interpretation None      MDM   Final diagnoses:  None    Patient is a 38 year old female who presents with complaints of rectal bleeding. This occurs after she has her bowel movements, but not during. She denies any rectal pain, abdominal pain, fever. She denies any constipation or passing of hard stool. Rectal examination reveals no gross abnormality. I see no external hemorrhoids, fissures, or other pathology. Internal exam reveals no masses, however stool is heme positive. Her vitals are stable and she is in no acute distress. I feel as though the most likely scenario is either an internal hemorrhoid or rectal fissure. I will advise a high-fiber diet and provide the contact information for Dr. Laural Golden who is the on-call physician for gastroenterology so that she may arrange followup to discuss a colonoscopy.  I personally performed the services described in this documentation, which was scribed in my presence. The recorded information has been reviewed and is accurate.       Veryl Speak, MD 07/27/13 [phone]

## 2013-07-27 NOTE — ED Notes (Signed)
Pt states she has been noticing bright red blood from the rectum, usually when using the restroom.

## 2013-08-04 ENCOUNTER — Ambulatory Visit (INDEPENDENT_AMBULATORY_CARE_PROVIDER_SITE_OTHER): Payer: Medicaid Other | Admitting: Internal Medicine

## 2013-08-04 ENCOUNTER — Encounter (INDEPENDENT_AMBULATORY_CARE_PROVIDER_SITE_OTHER): Payer: Self-pay | Admitting: Internal Medicine

## 2013-08-04 ENCOUNTER — Other Ambulatory Visit (INDEPENDENT_AMBULATORY_CARE_PROVIDER_SITE_OTHER): Payer: Self-pay | Admitting: *Deleted

## 2013-08-04 ENCOUNTER — Telehealth (INDEPENDENT_AMBULATORY_CARE_PROVIDER_SITE_OTHER): Payer: Self-pay | Admitting: *Deleted

## 2013-08-04 VITALS — BP 100/62 | HR 68 | Temp 98.0°F | Ht 64.0 in | Wt >= 6400 oz

## 2013-08-04 DIAGNOSIS — R195 Other fecal abnormalities: Secondary | ICD-10-CM | POA: Insufficient documentation

## 2013-08-04 DIAGNOSIS — Z1211 Encounter for screening for malignant neoplasm of colon: Secondary | ICD-10-CM

## 2013-08-04 DIAGNOSIS — K625 Hemorrhage of anus and rectum: Secondary | ICD-10-CM

## 2013-08-04 NOTE — Patient Instructions (Signed)
Colonoscopy with Dr. Rehman. The risks and benefits such as perforation, bleeding, and infection were reviewed with the patient and is agreeable. 

## 2013-08-04 NOTE — Telephone Encounter (Signed)
Patient needs movi prep 

## 2013-08-04 NOTE — Progress Notes (Signed)
Subjective:     Patient ID: Amy [name], female   DOB: 12/01/1975, 38 y.o.   MRN: [id number]  HPI Referred to our office after a recent visit to the ED at [facility] for rectal bleeding.  She tells me everytimes she wiped, there would be blood in the tissue. She did not see it in the toilet. She was guaiac positive in the ED on July 27, 2013. She also reports she had similar symptoms about 2 months ago, which resolved. Symptoms x 2 days before resolving.  She was not evaluated for the 1st rectal bleeding.  LMP:  Implanon for birth control and has not had a menstrual period in about 3 years. Patient has no other pertinent medical history.  Appetite is normal. No unintentional weight loss. No abdominal pain.  She usually ha a BM twice. Stools are brown in color.  No family hx of colon cancer that she knows of.     HPI Comments:   CBC    Component Value Date/Time   WBC 9.2 07/28/2012 0905   RBC 4.71 07/28/2012 0905   HGB 13.6 07/28/2012 0905   HCT 40.8 07/28/2012 0905   PLT 312 07/28/2012 0905   MCV 86.6 07/28/2012 0905   MCH 28.9 07/28/2012 0905   MCHC 33.3 07/28/2012 0905   RDW 14.3 07/28/2012 0905   LYMPHSABS 2.2 07/28/2012 0905   MONOABS 0.5 07/28/2012 0905   EOSABS 0.1 07/28/2012 0905   BASOSABS 0.0 07/28/2012 0905       Review of Systems see hpi Past Medical History  Diagnosis Date  . Hx of chlamydia infection   . HSV-2 (herpes simplex virus 2) infection   . Obesity   . Pain in right hip 08/09/2012    Past Surgical History  Procedure Laterality Date  . Cesarean section      Allergies  Allergen Reactions  . Keflex [Cephalexin] Itching and Rash    Current Outpatient Prescriptions on File Prior to Visit  Medication Sig Dispense Refill  . etonogestrel (IMPLANON) 68 MG IMPL implant Inject 1 each into the skin once.      Marland Kitchen ibuprofen (ADVIL,MOTRIN) 200 MG tablet Take 800 mg by mouth every 6 (six) hours as needed for pain.       No current facility-administered medications on file prior  to visit.         Objective:   Physical Exam  Filed Vitals:   08/04/13 1004  BP: 100/62  Pulse: 68  Temp: 98 F (36.7 C)  Height: 5\' 4"  (1.626 m)  Weight: 437 lb 11.2 oz (198.539 kg)   Alert and oriented. Skin warm and dry. Oral mucosa is moist.   . Sclera anicteric, conjunctivae is pink. Thyroid not enlarged. No cervical lymphadenopathy. Lungs clear. Heart regular rate and rhythm.  Abdomen is soft. Bowel sounds are positive. No hepatomegaly. No abdominal masses felt. No tenderness.  No edema to lower extremities.  No stool and guaiac negative.      Assessment:    Guaiac positive stool. No family hx of colon cancer. Painless bleeding.     Plan:     Colonoscopy.The risks and benefits such as perforation, bleeding, and infection were reviewed with the patient and is agreeable.

## 2013-08-08 MED ORDER — PEG-KCL-NACL-NASULF-NA ASC-C 100 G PO SOLR: 1.0000 | Freq: Once | ORAL | Status: DC

## 2013-08-18 ENCOUNTER — Encounter (HOSPITAL_COMMUNITY): Payer: Self-pay | Admitting: Pharmacy Technician

## 2013-09-01 ENCOUNTER — Ambulatory Visit (HOSPITAL_COMMUNITY)
Admission: RE | Admit: 2013-09-01 | Discharge: 2013-09-01 | Disposition: A | Discharge status: Home / Self Care | Payer: Medicaid Other | Source: Ambulatory Visit | Attending: Internal Medicine | Admitting: Internal Medicine

## 2013-09-01 ENCOUNTER — Encounter (HOSPITAL_COMMUNITY)
Admission: RE | Disposition: A | Discharge status: Home / Self Care | Payer: Self-pay | Source: Ambulatory Visit | Attending: Internal Medicine

## 2013-09-01 ENCOUNTER — Encounter (HOSPITAL_COMMUNITY): Payer: Self-pay | Admitting: *Deleted

## 2013-09-01 DIAGNOSIS — K921 Melena: Secondary | ICD-10-CM

## 2013-09-01 DIAGNOSIS — K625 Hemorrhage of anus and rectum: Secondary | ICD-10-CM

## 2013-09-01 DIAGNOSIS — F17200 Nicotine dependence, unspecified, uncomplicated: Secondary | ICD-10-CM | POA: Insufficient documentation

## 2013-09-01 DIAGNOSIS — Z800 Family history of malignant neoplasm of digestive organs: Secondary | ICD-10-CM

## 2013-09-01 DIAGNOSIS — K648 Other hemorrhoids: Secondary | ICD-10-CM | POA: Insufficient documentation

## 2013-09-01 DIAGNOSIS — D126 Benign neoplasm of colon, unspecified: Secondary | ICD-10-CM | POA: Insufficient documentation

## 2013-09-01 DIAGNOSIS — K644 Residual hemorrhoidal skin tags: Secondary | ICD-10-CM

## 2013-09-01 DIAGNOSIS — K5730 Diverticulosis of large intestine without perforation or abscess without bleeding: Secondary | ICD-10-CM | POA: Insufficient documentation

## 2013-09-01 HISTORY — PX: COLONOSCOPY: SHX5424

## 2013-09-01 SURGERY — COLONOSCOPY
Anesthesia: Moderate Sedation

## 2013-09-01 MED ORDER — MEPERIDINE HCL 50 MG/ML IJ SOLN
INTRAMUSCULAR | Status: AC
  Filled 2013-09-01: qty 1

## 2013-09-01 MED ORDER — SODIUM CHLORIDE 0.9 % IV SOLN
INTRAVENOUS | Status: DC
  Administered 2013-09-01: 13:00:00 via INTRAVENOUS

## 2013-09-01 MED ORDER — MEPERIDINE HCL 50 MG/ML IJ SOLN
INTRAMUSCULAR | Status: DC | PRN
  Administered 2013-09-01 (×2): 25 mg via INTRAVENOUS

## 2013-09-01 MED ORDER — MIDAZOLAM HCL 5 MG/5ML IJ SOLN
INTRAMUSCULAR | Status: AC
  Filled 2013-09-01: qty 10

## 2013-09-01 MED ORDER — STERILE WATER FOR IRRIGATION IR SOLN
Status: DC | PRN
  Administered 2013-09-01: 13:00:00

## 2013-09-01 MED ORDER — MIDAZOLAM HCL 5 MG/5ML IJ SOLN
INTRAMUSCULAR | Status: DC | PRN
  Administered 2013-09-01 (×2): 2 mg via INTRAVENOUS
  Administered 2013-09-01: 3 mg via INTRAVENOUS
  Administered 2013-09-01: 1 mg via INTRAVENOUS
  Administered 2013-09-01: 2 mg via INTRAVENOUS

## 2013-09-01 NOTE — H&P (Signed)
Amy [name] is an 38 y.o. female.   Chief Complaint: Patient is here for colonoscopy. HPI: Patient is 38 year old African female who presents with 2 month history of intermittent hematochezia. She denies abdominal or rectal pain. She also denies melena, diarrhea, constipation or frank rectal bleeding. She takes ibuprofen occasionally for musculoskeletal pain. Family history significant for colon carcinoma in her biologic father who died in his 29s.  Past Medical History  Diagnosis Date  . Hx of chlamydia infection   . HSV-2 (herpes simplex virus 2) infection   . Obesity   . Pain in right hip 08/09/2012    Past Surgical History  Procedure Laterality Date  . Cesarean section      Family History  Problem Relation Age of Onset  . Hypertension Mother   . Diabetes Mother   . COPD Mother   . Hypertension Sister   . Cancer Maternal Grandfather   . Allergies Son    Social History:  reports that she has been smoking Cigarettes.  She has been smoking about 0.50 packs per day. She has never used smokeless tobacco. She reports that she drinks alcohol. She reports that she does not use illicit drugs.  Allergies:  Allergies  Allergen Reactions  . Keflex [Cephalexin] Itching and Rash    Medications Prior to Admission  Medication Sig Dispense Refill  . etonogestrel (IMPLANON) 68 MG IMPL implant Inject 1 each into the skin once.      Marland Kitchen ibuprofen (ADVIL,MOTRIN) 200 MG tablet Take 800 mg by mouth every 6 (six) hours as needed for pain.      . peg 3350 powder (MOVIPREP) 100 G SOLR Take 1 kit (200 g total) by mouth once.  1 kit  0    No results found for this or any previous visit (from the past 48 hour(s)). No results found.  ROS  Blood pressure 126/65, pulse 88, temperature 98.2 F (36.8 C), temperature source Oral, resp. rate 18, SpO2 96.00%. Physical Exam  Constitutional:  Pleasant well-developed obese African American female in NAD  HENT:  Mouth/Throat: Oropharynx is clear and  moist.  Eyes: Conjunctivae are normal. No scleral icterus.  Neck: No thyromegaly present.  Cardiovascular: Normal rate, regular rhythm and normal heart sounds.   No murmur heard. Respiratory: Effort normal and breath sounds normal.  GI: Soft. She exhibits no distension and no mass. There is no tenderness.  Musculoskeletal: Edema: trace edema around ankles.  Lymphadenopathy:    She has no cervical adenopathy.  Neurological: She is alert.  Skin: Skin is warm and dry.     Assessment/Plan Intermittent hematochezia. Family history CRC in father(died in his 70s). Diagnostic colonoscopy.  Antwaun Buth U Prince Olivier 09/01/2013, 1:15 PM

## 2013-09-01 NOTE — Op Note (Signed)
COLONOSCOPY PROCEDURE REPORT  PATIENT:  Amy [name]  MR#:  [id number] Birthdate:  1976/02/10, 38 y.o., female Endoscopist:  Dr. Rogene Houston, MD Referred By:  Tammi Klippel, PA-C  Procedure Date: 09/01/2013  Procedure:   Colonoscopy  Indications:  Patient is a 38 year old African female who presents with two-month history of intermittent hematochezia. She denies diarrhea or constipation. Family history significant for colon carcinoma in her father who died in his 51s.  Informed Consent:  The procedure and risks were reviewed with the patient and informed consent was obtained.  Medications:  Demerol 50 mg IV Versed 10 mg IV  Description of procedure:  After a digital rectal exam was performed, that colonoscope was advanced from the anus through the rectum and colon to the area of the cecum, ileocecal valve and appendiceal orifice. The cecum was deeply intubated. These structures were well-seen and photographed for the record. From the level of the cecum and ileocecal valve, the scope was slowly and cautiously withdrawn. The mucosal surfaces were carefully surveyed utilizing scope tip to flexion to facilitate fold flattening as needed. The scope was pulled down into the rectum where a thorough exam including retroflexion was performed.  Findings:   Prep excellent. Few scattered diverticula noted at the ascending and sigmoid colon. Three small polyps were ablated via cold biopsy and submitted together. Two of these were splenic flexure and the third one was at sigmoid colon. Small hemorrhoids proximal to the dentate line.   Therapeutic/Diagnostic Maneuvers Performed:  See above  Complications:  None  Cecal Withdrawal Time:  16 minutes  Impression:  Examination performed to cecum. Few scattered diverticula at ascending and sigmoid colon. Three small polyps ablated via cold biopsy and submitted together(2 at splenic flexure and one at sigmoid colon). Internal hemorrhoids  felt to be most likely source of intermittent hematochezia.  Recommendations:  Standard instructions given. High fiber diet. I will contact patient with biopsy results and further recommendations.  Rogene Houston  09/01/2013 2:06 PM  CC: Dr. Collene Mares, Marland Kitchen, PA-C & Dr. Rayne Du ref. provider found

## 2013-09-01 NOTE — Discharge Instructions (Addendum)
Resume usual medications. High fiber diet. No driving for 24 hours. Physician will call with biopsy results. Colonoscopy, Care After Refer to this sheet in the next few weeks. These instructions provide you with information on caring for yourself after your procedure. Your health care provider may also give you more specific instructions. Your treatment has been planned according to current medical practices, but problems sometimes occur. Call your health care provider if you have any problems or questions after your procedure. WHAT TO EXPECT AFTER THE PROCEDURE  After your procedure, it is typical to have the following:  A small amount of blood in your stool.  Moderate amounts of gas and mild abdominal cramping or bloating. HOME CARE INSTRUCTIONS  Do not drive, operate machinery, or sign important documents for 24 hours.  You may shower and resume your regular physical activities, but move at a slower pace for the first 24 hours.  Take frequent rest periods for the first 24 hours.  Walk around or put a warm pack on your abdomen to help reduce abdominal cramping and bloating.  Drink enough fluids to keep your urine clear or pale yellow.  You may resume your normal diet as instructed by your health care provider. Avoid heavy or fried foods that are hard to digest.  Avoid drinking alcohol for 24 hours or as instructed by your health care provider.  Only take over-the-counter or prescription medicines as directed by your health care provider.  If a tissue sample (biopsy) was taken during your procedure:  Do not take aspirin or blood thinners for 7 days, or as instructed by your health care provider.  Do not drink alcohol for 7 days, or as instructed by your health care provider.  Eat soft foods for the first 24 hours. SEEK MEDICAL CARE IF: You have persistent spotting of blood in your stool 2 3 days after the procedure. SEEK IMMEDIATE MEDICAL CARE IF:  You have more than a small  spotting of blood in your stool.  You pass large blood clots in your stool.  Your abdomen is swollen (distended).  You have nausea or vomiting.  You have a fever.  You have increasing abdominal pain that is not relieved with medicine. Document Released: 11/20/2003 Document Revised: 01/26/2013 Document Reviewed: 12/13/2012 [facility] Patient Information 2014 [place]. High-Fiber Diet Fiber is found in fruits, vegetables, and grains. A high-fiber diet encourages the addition of more whole grains, legumes, fruits, and vegetables in your diet. The recommended amount of fiber for adult males is 38 g per day. For adult females, it is 25 g per day. Pregnant and lactating women should get 28 g of fiber per day. If you have a digestive or bowel problem, ask your caregiver for advice before adding high-fiber foods to your diet. Eat a variety of high-fiber foods instead of only a select few type of foods.  PURPOSE  To increase stool bulk.  To make bowel movements more regular to prevent constipation.  To lower cholesterol.  To prevent overeating. WHEN IS THIS DIET USED?  It may be used if you have constipation and hemorrhoids.  It may be used if you have uncomplicated diverticulosis (intestine condition) and irritable bowel syndrome.  It may be used if you need help with weight management.  It may be used if you want to add it to your diet as a protective measure against atherosclerosis, diabetes, and cancer. SOURCES OF FIBER  Whole-grain breads and cereals.  Fruits, such as apples, oranges, bananas, berries, prunes,  and pears.  Vegetables, such as green peas, carrots, sweet potatoes, beets, broccoli, cabbage, spinach, and artichokes.  Legumes, such split peas, soy, lentils.  Almonds. FIBER CONTENT IN FOODS Starches and Grains / Dietary Fiber (g)  Cheerios, 1 cup / 3 g  Corn Flakes cereal, 1 cup / 0.7 g  Rice crispy treat cereal, 1 cup / 0.3 g  Instant oatmeal  (cooked),  cup / 2 g  Frosted wheat cereal, 1 cup / 5.1 g  Brown, long-grain rice (cooked), 1 cup / 3.5 g  White, long-grain rice (cooked), 1 cup / 0.6 g  Enriched macaroni (cooked), 1 cup / 2.5 g Legumes / Dietary Fiber (g)  Baked beans (canned, plain, or vegetarian),  cup / 5.2 g  Kidney beans (canned),  cup / 6.8 g  Pinto beans (cooked),  cup / 5.5 g Breads and Crackers / Dietary Fiber (g)  Plain or honey graham crackers, 2 squares / 0.7 g  Saltine crackers, 3 squares / 0.3 g  Plain, salted pretzels, 10 pieces / 1.8 g  Whole-wheat bread, 1 slice / 1.9 g  White bread, 1 slice / 0.7 g  Raisin bread, 1 slice / 1.2 g  Plain bagel, 3 oz / 2 g  Flour tortilla, 1 oz / 0.9 g  Corn tortilla, 1 small / 1.5 g  Hamburger or hotdog bun, 1 small / 0.9 g Fruits / Dietary Fiber (g)  Apple with skin, 1 medium / 4.4 g  Sweetened applesauce,  cup / 1.5 g  Banana,  medium / 1.5 g  Grapes, 10 grapes / 0.4 g  Orange, 1 small / 2.3 g  Raisin, 1.5 oz / 1.6 g  Melon, 1 cup / 1.4 g Vegetables / Dietary Fiber (g)  Green beans (canned),  cup / 1.3 g  Carrots (cooked),  cup / 2.3 g  Broccoli (cooked),  cup / 2.8 g  Peas (cooked),  cup / 4.4 g  Mashed potatoes,  cup / 1.6 g  Lettuce, 1 cup / 0.5 g  Corn (canned),  cup / 1.6 g  Tomato,  cup / 1.1 g Document Released: 04/07/2005 Document Revised: 10/07/2011 Document Reviewed: 07/10/2011 [facility] Patient Information 2014 [place], [place].

## 2013-09-02 ENCOUNTER — Encounter (HOSPITAL_COMMUNITY): Payer: Self-pay | Admitting: Internal Medicine

## 2013-09-08 ENCOUNTER — Encounter (INDEPENDENT_AMBULATORY_CARE_PROVIDER_SITE_OTHER): Payer: Self-pay | Admitting: *Deleted

## 2014-01-12 ENCOUNTER — Encounter: Payer: Medicaid Other | Admitting: Adult Health

## 2014-01-19 ENCOUNTER — Encounter: Payer: Self-pay | Admitting: Adult Health

## 2014-01-19 ENCOUNTER — Other Ambulatory Visit (HOSPITAL_COMMUNITY)
Admission: RE | Admit: 2014-01-19 | Discharge: 2014-01-19 | Disposition: A | Discharge status: Home / Self Care | Payer: MEDICAID | Source: Ambulatory Visit | Attending: Adult Health | Admitting: Adult Health

## 2014-01-19 ENCOUNTER — Encounter: Payer: Medicaid Other | Admitting: Adult Health

## 2014-01-19 ENCOUNTER — Ambulatory Visit (INDEPENDENT_AMBULATORY_CARE_PROVIDER_SITE_OTHER): Payer: Medicaid Other | Admitting: Adult Health

## 2014-01-19 VITALS — BP 128/74 | HR 80 | Ht 64.0 in | Wt >= 6400 oz

## 2014-01-19 DIAGNOSIS — K219 Gastro-esophageal reflux disease without esophagitis: Secondary | ICD-10-CM

## 2014-01-19 DIAGNOSIS — Z01419 Encounter for gynecological examination (general) (routine) without abnormal findings: Secondary | ICD-10-CM | POA: Insufficient documentation

## 2014-01-19 DIAGNOSIS — IMO0001 Reserved for inherently not codable concepts without codable children: Secondary | ICD-10-CM

## 2014-01-19 DIAGNOSIS — Z1151 Encounter for screening for human papillomavirus (HPV): Secondary | ICD-10-CM | POA: Insufficient documentation

## 2014-01-19 DIAGNOSIS — Z308 Encounter for other contraceptive management: Secondary | ICD-10-CM

## 2014-01-19 DIAGNOSIS — Z3046 Encounter for surveillance of implantable subdermal contraceptive: Secondary | ICD-10-CM

## 2014-01-19 DIAGNOSIS — M25551 Pain in right hip: Secondary | ICD-10-CM

## 2014-01-19 HISTORY — DX: Reserved for inherently not codable concepts without codable children: IMO0001

## 2014-01-19 LAB — POCT URINE PREGNANCY: Preg Test, Ur: NEGATIVE

## 2014-01-19 MED ORDER — OMEPRAZOLE 20 MG PO CPDR: 20.0000 mg | DELAYED_RELEASE_CAPSULE | Freq: Every day | ORAL | Status: DC

## 2014-01-19 NOTE — Patient Instructions (Signed)
Hip Pain Your hip is the joint between your upper legs and your lower pelvis. The bones, cartilage, tendons, and muscles of your hip joint perform a lot of work each day supporting your body weight and allowing you to move around. Hip pain can range from a minor ache to severe pain in one or both of your hips. Pain may be felt on the inside of the hip joint near the groin, or the outside near the buttocks and upper thigh. You may have swelling or stiffness as well.  HOME CARE INSTRUCTIONS  Take medicines only as directed by your health care provider. Apply ice to the injured area: Put ice in a plastic bag. Place a towel between your skin and the bag. Leave the ice on for 15-20 minutes at a time, 3-4 times a day. Keep your leg raised (elevated) when possible to lessen swelling. Avoid activities that cause pain. Follow specific exercises as directed by your health care provider. Sleep with a pillow between your legs on your most comfortable side. Record how often you have hip pain, the location of the pain, and what it feels like. SEEK MEDICAL CARE IF:  You are unable to put weight on your leg. Your hip is red or swollen or very tender to touch. Your pain or swelling continues or worsens after 1 week. You have increasing difficulty walking. You have a fever. SEEK IMMEDIATE MEDICAL CARE IF:  You have fallen. You have a sudden increase in pain and swelling in your hip. MAKE SURE YOU:  Understand these instructions. Will watch your condition. Will get help right away if you are not doing well or get worse. Document Released: 09/25/2009 Document Revised: 08/22/2013 Document Reviewed: 12/02/2012 [facility] Patient Information 2015 [place], [place]. This information is not intended to replace advice given to you by your health care provider. Make sure you discuss any questions you have with your health care provider. Gastroesophageal Reflux Disease, Adult Gastroesophageal reflux disease (GERD)  happens when acid from your stomach flows up into the esophagus. When acid comes in contact with the esophagus, the acid causes soreness (inflammation) in the esophagus. Over time, GERD may create small holes (ulcers) in the lining of the esophagus. CAUSES   Increased body weight. This puts pressure on the stomach, making acid rise from the stomach into the esophagus.  Smoking. This increases acid production in the stomach.  Drinking alcohol. This causes decreased pressure in the lower esophageal sphincter (valve or ring of muscle between the esophagus and stomach), allowing acid from the stomach into the esophagus.  Late evening meals and a full stomach. This increases pressure and acid production in the stomach.  A malformed lower esophageal sphincter. Sometimes, no cause is found. SYMPTOMS   Burning pain in the lower part of the mid-chest behind the breastbone and in the mid-stomach area. This may occur twice a week or more often.  Trouble swallowing.  Sore throat.  Dry cough.  Asthma-like symptoms including chest tightness, shortness of breath, or wheezing. DIAGNOSIS  Your caregiver may be able to diagnose GERD based on your symptoms. In some cases, X-rays and other tests may be done to check for complications or to check the condition of your stomach and esophagus. TREATMENT  Your caregiver may recommend over-the-counter or prescription medicines to help decrease acid production. Ask your caregiver before starting or adding any new medicines.  HOME CARE INSTRUCTIONS   Change the factors that you can control. Ask your caregiver for guidance concerning weight loss,  quitting smoking, and alcohol consumption.  Avoid foods and drinks that make your symptoms worse, such as:  Caffeine or alcoholic drinks.  Chocolate.  Peppermint or mint flavorings.  Garlic and onions.  Spicy foods.  Citrus fruits, such as oranges, lemons, or limes.  Tomato-based foods such as sauce, chili,  salsa, and pizza.  Fried and fatty foods.  Avoid lying down for the 3 hours prior to your bedtime or prior to taking a nap.  Eat small, frequent meals instead of large meals.  Wear loose-fitting clothing. Do not wear anything tight around your waist that causes pressure on your stomach.  Raise the head of your bed 6 to 8 inches with wood blocks to help you sleep. Extra pillows will not help.  Only take over-the-counter or prescription medicines for pain, discomfort, or fever as directed by your caregiver.  Do not take aspirin, ibuprofen, or other nonsteroidal anti-inflammatory drugs (NSAIDs). SEEK IMMEDIATE MEDICAL CARE IF:   You have pain in your arms, neck, jaw, teeth, or back.  Your pain increases or changes in intensity or duration.  You develop nausea, vomiting, or sweating (diaphoresis).  You develop shortness of breath, or you faint.  Your vomit is green, yellow, black, or looks like coffee grounds or blood.  Your stool is red, bloody, or black. These symptoms could be signs of other problems, such as heart disease, gastric bleeding, or esophageal bleeding. MAKE SURE YOU:   Understand these instructions.  Will watch your condition.  Will get help right away if you are not doing well or get worse. Document Released: 01/15/2005 Document Revised: 06/30/2011 Document Reviewed: 10/25/2010 [facility] Patient Information 2015 [place], [place]. This information is not intended to replace advice given to you by your health care provider. Make sure you discuss any questions you have with your health care provider. Use condoms Try Prilosec return in 2 weeks for removal and reinsertion of nexplanon Physical in 1 year

## 2014-01-19 NOTE — Addendum Note (Signed)
Addended by: Derrek Monaco A on: 01/19/2014 02:46 PM   Modules accepted: Orders

## 2014-01-19 NOTE — Progress Notes (Signed)
Patient ID: Amy [name], female   DOB: December 26, 1975, 38 y.o.   MRN: [id number] History of Present Illness: Amy [name] is a 38 year old black female in for a family planning pap and physical.She is complaining of reflux with some vomiting and pain in right hip has had injection with out relief.She wants Neplanon removed and reinserted.   Current Medications, Allergies, Past Medical History, Past Surgical History, Family History and Social History were reviewed in [employer] record.     Review of Systems: Patient denies any headaches, blurred vision, shortness of breath, chest pain, abdominal pain, problems with bowel movements, urination, or intercourse. No mood swings, see HPI for positives. No periods on nexplanon.   Physical Exam:BP 128/74  Pulse 80  Ht 5\' 4"  (1.626 m)  Wt 441 lb (200.036 kg)  BMI 75.66 kg/m2UPT negative. General:  Well developed, well nourished, no acute distress Skin:  Warm and dry Neck:  Midline trachea, normal thyroid Lungs; Clear to auscultation bilaterally Breast:  No dominant palpable mass, retraction, or nipple discharge, large breasts Cardiovascular: Regular rate and rhythm Abdomen:  Soft,mildly tender, RUQ, no hepatosplenomegaly,morbidly obese, tender at right hip bursa Pelvic:  External genitalia is normal in appearance.  The vagina is normal in appearance.  The cervix is bulbous.Pap with GC/CHL performed, had negative HPV last year.  Uterus is felt to be normal size, shape, and contour.  No  adnexal masses or tenderness noted.difficult secondary to abdominal girth. Extremities:  No swelling or varicosities noted Psych:  No mood changes, alert and cooperative, seems happy   Impression: Well woman gyn exam Nexplanon in place Pain right hip  Reflux    Plan: Order nexplanon See PCP for right hip pain Rx prilosec 20 mg #30 1 daily with 11 refills, if reflux,and vomiting persists may need GB US Return in 2 weeks for nexplanon  removal and reinsertion Use condoms Physical in 1 year Get HIV and RPR checked today

## 2014-01-20 LAB — RPR

## 2014-01-20 LAB — HIV ANTIBODY (ROUTINE TESTING W REFLEX): HIV 1&2 Ab, 4th Generation: NONREACTIVE

## 2014-01-23 LAB — CYTOLOGY - PAP

## 2014-02-07 ENCOUNTER — Encounter: Payer: Self-pay | Admitting: Adult Health

## 2014-02-07 ENCOUNTER — Ambulatory Visit (INDEPENDENT_AMBULATORY_CARE_PROVIDER_SITE_OTHER): Payer: Medicaid Other | Admitting: Adult Health

## 2014-02-07 VITALS — BP 112/82 | Ht 64.0 in | Wt >= 6400 oz

## 2014-02-07 DIAGNOSIS — Z3202 Encounter for pregnancy test, result negative: Secondary | ICD-10-CM

## 2014-02-07 DIAGNOSIS — Z30017 Encounter for initial prescription of implantable subdermal contraceptive: Secondary | ICD-10-CM | POA: Insufficient documentation

## 2014-02-07 DIAGNOSIS — Z3049 Encounter for surveillance of other contraceptives: Secondary | ICD-10-CM

## 2014-02-07 DIAGNOSIS — Z3046 Encounter for surveillance of implantable subdermal contraceptive: Secondary | ICD-10-CM | POA: Insufficient documentation

## 2014-02-07 HISTORY — DX: Encounter for initial prescription of implantable subdermal contraceptive: Z30.017

## 2014-02-07 HISTORY — DX: Encounter for surveillance of implantable subdermal contraceptive: Z30.46

## 2014-02-07 LAB — POCT URINE PREGNANCY: Preg Test, Ur: NEGATIVE

## 2014-02-07 NOTE — Progress Notes (Signed)
Subjective:     Patient ID: Amy [name], female   DOB: 1976/02/27, 38 y.o.   MRN: [id number]  HPI Amy [name] is a  38 year old black female in for nexplanon removal and reinsertion.She says she feels much better taking prilosec 20 mg daily and increase to 1 bid to see if even better and let men know.Has not seen PCP for pain in hip yet,but will do.  Review of Systems See HPI Reviewed past medical,surgical, social and family history. Reviewed medications and allergies.     Objective:   Physical Exam BP 112/82  Ht 5\' 4"  (1.626 m)  Wt 440 lb 12.8 oz (199.946 kg)  BMI 75.63 kg/m2 UPT negative.   Consent signed,time out called, Left arm cleansed with betadine, and injected with 1.5 cc 2% lidocaine and waited til numb.Small vertical incision made with #11 blade, under sterile technique and nexplanon removed with forceps and then Nexplanon easily inserted and steri strips applied.Easily palpated rod by pt and provider. Pressure dressing applied.  Assessment:     Nexplanon removal Nexplanon insertion  Lot # [id number] exp 2/18     Plan:     Use condoms, keep clean and dry x 24 hours, no heavy lifting, keep steri strips on x 72 hours, Keep pressure dressing on x 24 hours. Follow up prn problems.

## 2014-02-07 NOTE — Patient Instructions (Signed)
Use condoms, keep clean and dry x 24 hours, no heavy lifting, keep steri strips on x 72 hours, Keep pressure dressing on x 24 hours. Follow up prn problems.  

## 2014-02-20 ENCOUNTER — Encounter: Payer: Self-pay | Admitting: Adult Health

## 2014-04-02 ENCOUNTER — Emergency Department (HOSPITAL_COMMUNITY): Payer: Self-pay

## 2014-04-02 ENCOUNTER — Emergency Department (HOSPITAL_COMMUNITY): Payer: Medicaid Other

## 2014-04-02 ENCOUNTER — Encounter (HOSPITAL_COMMUNITY): Payer: Self-pay | Admitting: *Deleted

## 2014-04-02 ENCOUNTER — Emergency Department (HOSPITAL_COMMUNITY)
Admission: EM | Admit: 2014-04-02 | Discharge: 2014-04-02 | Disposition: A | Discharge status: Home / Self Care | Payer: Self-pay | Attending: Emergency Medicine | Admitting: Emergency Medicine

## 2014-04-02 DIAGNOSIS — Z720 Tobacco use: Secondary | ICD-10-CM | POA: Insufficient documentation

## 2014-04-02 DIAGNOSIS — M546 Pain in thoracic spine: Secondary | ICD-10-CM | POA: Insufficient documentation

## 2014-04-02 DIAGNOSIS — Z8619 Personal history of other infectious and parasitic diseases: Secondary | ICD-10-CM | POA: Insufficient documentation

## 2014-04-02 DIAGNOSIS — E669 Obesity, unspecified: Secondary | ICD-10-CM | POA: Insufficient documentation

## 2014-04-02 DIAGNOSIS — R079 Chest pain, unspecified: Secondary | ICD-10-CM | POA: Insufficient documentation

## 2014-04-02 DIAGNOSIS — R000 Tachycardia, unspecified: Secondary | ICD-10-CM | POA: Insufficient documentation

## 2014-04-02 DIAGNOSIS — Z79899 Other long term (current) drug therapy: Secondary | ICD-10-CM | POA: Insufficient documentation

## 2014-04-02 DIAGNOSIS — K219 Gastro-esophageal reflux disease without esophagitis: Secondary | ICD-10-CM | POA: Insufficient documentation

## 2014-04-02 DIAGNOSIS — R0602 Shortness of breath: Secondary | ICD-10-CM | POA: Insufficient documentation

## 2014-04-02 LAB — CBC WITH DIFFERENTIAL/PLATELET
BASOS ABS: 0 10*3/uL (ref 0.0–0.1)
Basophils Relative: 0 % (ref 0–1)
EOS PCT: 1 % (ref 0–5)
Eosinophils Absolute: 0.1 10*3/uL (ref 0.0–0.7)
HCT: 41.6 % (ref 36.0–46.0)
Hemoglobin: 13.5 g/dL (ref 12.0–15.0)
LYMPHS ABS: 2.6 10*3/uL (ref 0.7–4.0)
LYMPHS PCT: 26 % (ref 12–46)
MCH: 28.6 pg (ref 26.0–34.0)
MCHC: 32.5 g/dL (ref 30.0–36.0)
MCV: 88.1 fL (ref 78.0–100.0)
Monocytes Absolute: 0.3 10*3/uL (ref 0.1–1.0)
Monocytes Relative: 3 % (ref 3–12)
NEUTROS PCT: 70 % (ref 43–77)
Neutro Abs: 7.1 10*3/uL (ref 1.7–7.7)
PLATELETS: 347 10*3/uL (ref 150–400)
RBC: 4.72 MIL/uL (ref 3.87–5.11)
RDW: 14.2 % (ref 11.5–15.5)
WBC: 10.1 10*3/uL (ref 4.0–10.5)

## 2014-04-02 LAB — COMPREHENSIVE METABOLIC PANEL
ALK PHOS: 62 U/L (ref 39–117)
ALT: 28 U/L (ref 0–35)
AST: 18 U/L (ref 0–37)
Albumin: 3.3 g/dL — ABNORMAL LOW (ref 3.5–5.2)
Anion gap: 13 (ref 5–15)
BUN: 6 mg/dL (ref 6–23)
CALCIUM: 9.1 mg/dL (ref 8.4–10.5)
CO2: 25 meq/L (ref 19–32)
Chloride: 102 mEq/L (ref 96–112)
Creatinine, Ser: 0.8 mg/dL (ref 0.50–1.10)
GFR calc Af Amer: >90 mL/min (ref >90)
GLUCOSE: 126 mg/dL — AB (ref 70–99)
Potassium: 3.8 mEq/L (ref 3.7–5.3)
SODIUM: 140 meq/L (ref 137–147)
Total Bilirubin: 0.2 mg/dL — ABNORMAL LOW (ref 0.3–1.2)
Total Protein: 7.8 g/dL (ref 6.0–8.3)

## 2014-04-02 LAB — D-DIMER, QUANTITATIVE: D-Dimer, Quant: 0.28 ug/mL-FEU (ref 0.00–0.48)

## 2014-04-02 LAB — TROPONIN I: Troponin I: <0.3 ng/mL (ref <0.30)

## 2014-04-02 LAB — PRO B NATRIURETIC PEPTIDE: Pro B Natriuretic peptide (BNP): 71.8 pg/mL (ref 0–125)

## 2014-04-02 LAB — LIPASE, BLOOD: Lipase: 13 U/L (ref 11–59)

## 2014-04-02 MED ORDER — METHOCARBAMOL 750 MG PO TABS: 750.0000 mg | ORAL_TABLET | Freq: Four times a day (QID) | ORAL | Status: DC

## 2014-04-02 MED ORDER — NITROGLYCERIN 0.4 MG SL SUBL
0.4000 mg | SUBLINGUAL_TABLET | SUBLINGUAL | Status: DC | PRN
  Administered 2014-04-02 (×2): 0.4 mg via SUBLINGUAL

## 2014-04-02 MED ORDER — FAMOTIDINE 20 MG PO TABS
40.0000 mg | ORAL_TABLET | Freq: Once | ORAL | Status: AC
  Administered 2014-04-02: 40 mg via ORAL
  Filled 2014-04-02: qty 2

## 2014-04-02 MED ORDER — IOHEXOL 350 MG/ML SOLN
125.0000 mL | Freq: Once | INTRAVENOUS | Status: AC | PRN
  Administered 2014-04-02: 125 mL via INTRAVENOUS

## 2014-04-02 MED ORDER — ASPIRIN 325 MG PO TABS
325.0000 mg | ORAL_TABLET | Freq: Once | ORAL | Status: DC
  Filled 2014-04-02: qty 1

## 2014-04-02 MED ORDER — GI COCKTAIL ~~LOC~~
30.0000 mL | Freq: Once | ORAL | Status: AC
  Administered 2014-04-02: 30 mL via ORAL
  Filled 2014-04-02: qty 30

## 2014-04-02 MED ORDER — ASPIRIN 81 MG PO CHEW
324.0000 mg | CHEWABLE_TABLET | Freq: Once | ORAL | Status: AC
  Administered 2014-04-02: 324 mg via ORAL
  Filled 2014-04-02: qty 4

## 2014-04-02 MED ORDER — NITROGLYCERIN 0.4 MG SL SUBL: 0.4000 mg | SUBLINGUAL_TABLET | Freq: Once | SUBLINGUAL | Status: DC

## 2014-04-02 NOTE — ED Provider Notes (Signed)
CSN: [id number]     Arrival date & time 04/02/14  2000 History  This chart was scribed for Leota Jacobsen, MD by Lowella Petties, ED Scribe. The patient was seen in room [facility]10/[facility]10. Patient's care was started at 8:09 PM.    Chief Complaint  Patient presents with  . Chest Pain   The history is provided by the patient. No language interpreter was used.   HPI Comments: Amy [name] is a 38 y.o. female who presents to the Emergency Department complaining of constant, central, chest pains that radiate to her back and began 20 minutes ago. She reports associated SOB. She denies associated diaphoresis. She denies leg pain or leg swelling, abdominal pain, or extremity pain. She has not taken any medication for this. She reports being tested for heart problems in the past, but she denies previous symptoms like this.  She reports occasional muscle spasms in her central chest at the end of the day, which feel different than this pain. Patient states that the pain started in her back and chest simultaneously and have been worse with movement and taking a deep breath. Denies any anginal type qualities of her pain. Prior history of pulmonary M was him. No leg pain either.  Past Medical History  Diagnosis Date  . Hx of chlamydia infection   . HSV-2 (herpes simplex virus 2) infection   . Obesity   . Pain in right hip 08/09/2012  . Reflux 01/19/2014  . Nexplanon insertion 02/07/2014    Inserted left arm 02/07/14  . Nexplanon removal 02/07/2014   Past Surgical History  Procedure Laterality Date  . Cesarean section    . Colonoscopy N/A 09/01/2013    Procedure: COLONOSCOPY;  Surgeon: Rogene Houston, MD;  Location: [facility];  Service: Endoscopy;  Laterality: N/A;  100   Family History  Problem Relation Age of Onset  . Hypertension Mother   . Diabetes Mother   . COPD Mother   . Hypertension Sister   . Cancer Maternal Grandfather   . Cancer Father    History  Substance Use Topics  . Smoking  status: Current Some Day Smoker -- 0.50 packs/day    Types: Cigarettes  . Smokeless tobacco: Never Used     Comment: on the weekends when she drinks  . Alcohol Use: Yes     Comment: on weekends   OB History    Gravida Para Term Preterm AB TAB SAB Ectopic Multiple Living   1 1 1       1      Review of Systems  Respiratory: Positive for shortness of breath.   Cardiovascular: Positive for chest pain.  All other systems reviewed and are negative.  Allergies  Keflex  Home Medications   Prior to Admission medications   Medication Sig Start Date End Date Taking? Authorizing Provider  etonogestrel (IMPLANON) 68 MG IMPL implant Inject 1 each into the skin once.    Historical Provider, MD  ibuprofen (ADVIL,MOTRIN) 200 MG tablet Take 800 mg by mouth every 6 (six) hours as needed for pain.    Historical Provider, MD  omeprazole (PRILOSEC) 20 MG capsule Take 1 capsule (20 mg total) by mouth daily. 01/19/14   Estill Dooms, NP   BP 129/84 mmHg  Pulse 100  Temp(Src) 98.8 F (37.1 C) (Oral)  Ht 5\' 4"  (1.626 m)  Wt 440 lb (199.583 kg)  BMI 75.49 kg/m2  SpO2 100% Physical Exam  Constitutional: She is oriented to person, place,  and time. She appears well-developed and well-nourished.  Non-toxic appearance. No distress.  HENT:  Head: Normocephalic and atraumatic.  Eyes: Conjunctivae, EOM and lids are normal. Pupils are equal, round, and reactive to light.  Neck: Normal range of motion. Neck supple. No tracheal deviation present. No thyroid mass present.  Cardiovascular: Regular rhythm and normal heart sounds.  Tachycardia present.  Exam reveals no gallop.   No murmur heard. Pulmonary/Chest: Effort normal and breath sounds normal. No stridor. No respiratory distress. She has no decreased breath sounds. She has no wheezes. She has no rhonchi. She has no rales.  Abdominal: Soft. Normal appearance and bowel sounds are normal. She exhibits no distension. There is no tenderness. There is no  rebound and no CVA tenderness.  Musculoskeletal: Normal range of motion. She exhibits no edema or tenderness.  Neurological: She is alert and oriented to person, place, and time. She has normal strength. No cranial nerve deficit or sensory deficit. GCS eye subscore is 4. GCS verbal subscore is 5. GCS motor subscore is 6.  Skin: Skin is warm and dry. No abrasion and no rash noted.  Psychiatric: She has a normal mood and affect. Her speech is normal and behavior is normal.  Nursing note and vitals reviewed.   ED Course  Procedures (including critical care time) DIAGNOSTIC STUDIES: Oxygen Saturation is 100% on room air, normal by my interpretation.    COORDINATION OF CARE: 8:15 PM-Discussed treatment plan which includes EKG, nitroglycerin, CXR and lab work with pt at bedside and pt agreed to plan.   Labs Review Labs Reviewed - No data to display  Imaging Review No results found.   EKG Interpretation   Date/Time:  Sunday April 02 2014 20:13:46 EST Ventricular Rate:  94 PR Interval:  161 QRS Duration: 80 QT Interval:  360 QTC Calculation: 450 R Axis:   30 Text Interpretation:  Sinus rhythm Low voltage, precordial leads  Borderline T wave abnormalities No significant change since last tracing  Confirmed by Jomar Denz  MD, Connor Meacham ([id number]) on 04/02/2014 8:18:22 PM      MDM   Final diagnoses:  None    I personally performed the services described in this documentation, which was scribed in my presence. The recorded information has been reviewed and is accurate.  Patient's chest CT negative for pulmonary embolism. Continues to endorse pain with movement of her back. Patient's first cardiac enzyme negative. Have offered to keep her for a repeat cardiac marker which she has deferred this time. Doubt that the patient has ACS. Suspect musculoskeletal strain we'll place on medications and discharged home    Leota Jacobsen, MD 04/02/14 2249

## 2014-04-02 NOTE — ED Notes (Signed)
Pt back from CT

## 2014-04-02 NOTE — Discharge Instructions (Signed)

## 2014-04-02 NOTE — ED Notes (Signed)
Pt states she was siting at home about 1 hour ago & chest pain started got worse while driving. Pain radiates between shoulder blades. Pt SOB after walking into the ER.

## 2014-08-05 ENCOUNTER — Emergency Department (HOSPITAL_COMMUNITY)
Admission: EM | Admit: 2014-08-05 | Discharge: 2014-08-05 | Disposition: A | Discharge status: Home / Self Care | Payer: Medicaid Other | Attending: Emergency Medicine | Admitting: Emergency Medicine

## 2014-08-05 ENCOUNTER — Encounter (HOSPITAL_COMMUNITY): Payer: Self-pay | Admitting: Cardiology

## 2014-08-05 DIAGNOSIS — Z720 Tobacco use: Secondary | ICD-10-CM | POA: Insufficient documentation

## 2014-08-05 DIAGNOSIS — E669 Obesity, unspecified: Secondary | ICD-10-CM | POA: Insufficient documentation

## 2014-08-05 DIAGNOSIS — J40 Bronchitis, not specified as acute or chronic: Secondary | ICD-10-CM

## 2014-08-05 DIAGNOSIS — Z79899 Other long term (current) drug therapy: Secondary | ICD-10-CM | POA: Insufficient documentation

## 2014-08-05 DIAGNOSIS — J209 Acute bronchitis, unspecified: Secondary | ICD-10-CM | POA: Insufficient documentation

## 2014-08-05 DIAGNOSIS — R05 Cough: Secondary | ICD-10-CM

## 2014-08-05 DIAGNOSIS — R059 Cough, unspecified: Secondary | ICD-10-CM

## 2014-08-05 DIAGNOSIS — Z8619 Personal history of other infectious and parasitic diseases: Secondary | ICD-10-CM | POA: Insufficient documentation

## 2014-08-05 DIAGNOSIS — K219 Gastro-esophageal reflux disease without esophagitis: Secondary | ICD-10-CM | POA: Insufficient documentation

## 2014-08-05 MED ORDER — AZITHROMYCIN 250 MG PO TABS: ORAL_TABLET | ORAL | Status: DC

## 2014-08-05 MED ORDER — PROMETHAZINE-CODEINE 6.25-10 MG/5ML PO SYRP: 10.0000 mL | ORAL_SOLUTION | Freq: Four times a day (QID) | ORAL | Status: DC | PRN

## 2014-08-05 NOTE — ED Provider Notes (Signed)
CSN: [id number]     Arrival date & time 08/05/14  1528 History   First MD Initiated Contact with Patient 08/05/14 1543     Chief Complaint  Patient presents with  . Cough      HPI  Patient presents for evaluation for cough. Cough has become productive yellow-green sputum. Nasal drainage without sinus pressure or congestion present feels "congested" in her lungs using her inhaler at home. Not wheezing today. No fever. No chest pain. No lower extremity edema.  Past Medical History  Diagnosis Date  . Hx of chlamydia infection   . HSV-2 (herpes simplex virus 2) infection   . Obesity   . Pain in right hip 08/09/2012  . Reflux 01/19/2014  . Nexplanon insertion 02/07/2014    Inserted left arm 02/07/14  . Nexplanon removal 02/07/2014   Past Surgical History  Procedure Laterality Date  . Cesarean section    . Colonoscopy N/A 09/01/2013    Procedure: COLONOSCOPY;  Surgeon: Rogene Houston, MD;  Location: [facility];  Service: Endoscopy;  Laterality: N/A;  100   Family History  Problem Relation Age of Onset  . Hypertension Mother   . Diabetes Mother   . COPD Mother   . Hypertension Sister   . Cancer Maternal Grandfather   . Cancer Father    History  Substance Use Topics  . Smoking status: Current Some Day Smoker -- 0.50 packs/day    Types: Cigarettes  . Smokeless tobacco: Never Used     Comment: on the weekends when she drinks  . Alcohol Use: Yes     Comment: on weekends   OB History    Gravida Para Term Preterm AB TAB SAB Ectopic Multiple Living   1 1 1       1      Review of Systems  Constitutional: Negative for fever, chills, diaphoresis, appetite change and fatigue.  HENT: Positive for congestion. Negative for mouth sores, sinus pressure, sore throat and trouble swallowing.   Eyes: Negative for visual disturbance.  Respiratory: Positive for cough and wheezing. Negative for chest tightness and shortness of breath.   Cardiovascular: Negative for chest pain.   Gastrointestinal: Negative for nausea, vomiting, abdominal pain, diarrhea and abdominal distention.  Endocrine: Negative for polydipsia, polyphagia and polyuria.  Genitourinary: Negative for dysuria, frequency and hematuria.  Musculoskeletal: Negative for gait problem.  Skin: Negative for color change, pallor and rash.  Neurological: Negative for dizziness, syncope, light-headedness and headaches.  Hematological: Does not bruise/bleed easily.  Psychiatric/Behavioral: Negative for behavioral problems and confusion.      Allergies  Keflex  Home Medications   Prior to Admission medications   Medication Sig Start Date End Date Taking? Authorizing Provider  azithromycin (ZITHROMAX Z-PAK) 250 MG tablet As directed 08/05/14   Tanna Furry, MD  etonogestrel (IMPLANON) 68 MG IMPL implant Inject 1 each into the skin once.    Historical Provider, MD  methocarbamol (ROBAXIN-750) 750 MG tablet Take 1 tablet (750 mg total) by mouth 4 (four) times daily. 04/02/14   Lacretia Leigh, MD  omeprazole (PRILOSEC) 20 MG capsule Take 1 capsule (20 mg total) by mouth daily. 01/19/14   Estill Dooms, NP  promethazine-codeine (PHENERGAN WITH CODEINE) 6.25-10 MG/5ML syrup Take 10 mLs by mouth every 6 (six) hours as needed for cough. 08/05/14   Tanna Furry, MD   BP 129/96 mmHg  Pulse 95  Temp(Src) 98.6 F (37 C) (Oral)  Resp 18  Ht 5\' 4"  (1.626 m)  Wt 432  lb (195.954 kg)  BMI 74.12 kg/m2  SpO2 98% Physical Exam  Constitutional: She is oriented to person, place, and time. She appears well-developed and well-nourished. No distress.  Obesity. Sitting upright. Does not appear distressed. Not dyspneic with conversation.  HENT:  Head: Normocephalic.  Nares clear. Posterior pharynx benign.Marland Kitchen No JVD in the neck.  Eyes: Conjunctivae are normal. Pupils are equal, round, and reactive to light. No scleral icterus.  Neck: Normal range of motion. Neck supple. No thyromegaly present.  Cardiovascular: Normal rate and  regular rhythm.  Exam reveals no gallop and no friction rub.   No murmur heard. Pulmonary/Chest: Effort normal and breath sounds normal. No respiratory distress. She has no wheezes. She has no rales.  Clear bilateral breath sounds. No wheezing rales rhonchi or prolongation. No focal changes.  Abdominal: Soft. Bowel sounds are normal. She exhibits no distension. There is no tenderness. There is no rebound.  Musculoskeletal: Normal range of motion.  Neurological: She is alert and oriented to person, place, and time.  Skin: Skin is warm and dry. No rash noted.  Psychiatric: She has a normal mood and affect. Her behavior is normal.    ED Course  Procedures (including critical care time) Labs Review Labs Reviewed - No data to display  Imaging Review No results found.   EKG Interpretation None      MDM   Final diagnoses:  Bronchitis  Cough    Clear lungs lungs. Not hypoxemic. Congested with sinus pressure. Marland Kitchen Not wheezing now, but using inhaler at home and continuing to smoke. Plan is Zithromax, Phenergan codeine syrup, Mucinex.    Tanna Furry, MD 08/05/14 [phone]

## 2014-08-05 NOTE — ED Notes (Signed)
Cough and nasal congestion times 3 weeks.

## 2014-08-05 NOTE — ED Notes (Signed)
MD at bedside. 

## 2014-08-05 NOTE — Discharge Instructions (Signed)
Cough, Adult   A cough is a reflex. It helps you clear your throat and airways. A cough can help heal your body. A cough can last 2 or 3 weeks (acute) or may last more than 8 weeks (chronic). Some common causes of a cough can include an infection, allergy, or a cold.  HOME CARE  · Only take medicine as told by your doctor.  · If given, take your medicines (antibiotics) as told. Finish them even if you start to feel better.  · Use a cold steam vaporizer or humidifier in your home. This can help loosen thick spit (secretions).  · Sleep so you are almost sitting up (semi-upright). Use pillows to do this. This helps reduce coughing.  · Rest as needed.  · Stop smoking if you smoke.  GET HELP RIGHT AWAY IF:  · You have yellowish-white fluid (pus) in your thick spit.  · Your cough gets worse.  · Your medicine does not reduce coughing, and you are losing sleep.  · You cough up blood.  · You have trouble breathing.  · Your pain gets worse and medicine does not help.  · You have a fever.  MAKE SURE YOU:   · Understand these instructions.  · Will watch your condition.  · Will get help right away if you are not doing well or get worse.  Document Released: 12/19/2010 Document Revised: 08/22/2013 Document Reviewed: 12/19/2010  [employer]® Patient Information ©2015 [employer]. This information is not intended to replace advice given to you by your health care provider. Make sure you discuss any questions you have with your health care provider.

## 2014-08-15 ENCOUNTER — Encounter (HOSPITAL_COMMUNITY): Payer: Self-pay | Admitting: Emergency Medicine

## 2014-08-15 ENCOUNTER — Inpatient Hospital Stay (HOSPITAL_COMMUNITY)
Admission: EM | Admit: 2014-08-15 | Discharge: 2014-08-17 | DRG: 392 | Disposition: A | Discharge status: Home / Self Care | Payer: Medicaid Other | Attending: Internal Medicine | Admitting: Internal Medicine

## 2014-08-15 ENCOUNTER — Inpatient Hospital Stay (HOSPITAL_COMMUNITY): Payer: Medicaid Other

## 2014-08-15 ENCOUNTER — Emergency Department (HOSPITAL_COMMUNITY): Payer: Medicaid Other

## 2014-08-15 DIAGNOSIS — R109 Unspecified abdominal pain: Secondary | ICD-10-CM

## 2014-08-15 DIAGNOSIS — D72829 Elevated white blood cell count, unspecified: Secondary | ICD-10-CM | POA: Diagnosis present

## 2014-08-15 DIAGNOSIS — Z6841 Body Mass Index (BMI) 40.0 and over, adult: Secondary | ICD-10-CM | POA: Diagnosis not present

## 2014-08-15 DIAGNOSIS — K6289 Other specified diseases of anus and rectum: Secondary | ICD-10-CM | POA: Insufficient documentation

## 2014-08-15 DIAGNOSIS — R0789 Other chest pain: Secondary | ICD-10-CM

## 2014-08-15 DIAGNOSIS — R52 Pain, unspecified: Secondary | ICD-10-CM

## 2014-08-15 DIAGNOSIS — K219 Gastro-esophageal reflux disease without esophagitis: Secondary | ICD-10-CM | POA: Diagnosis present

## 2014-08-15 DIAGNOSIS — R197 Diarrhea, unspecified: Secondary | ICD-10-CM | POA: Diagnosis present

## 2014-08-15 DIAGNOSIS — R1011 Right upper quadrant pain: Principal | ICD-10-CM

## 2014-08-15 DIAGNOSIS — Z881 Allergy status to other antibiotic agents status: Secondary | ICD-10-CM

## 2014-08-15 DIAGNOSIS — G8929 Other chronic pain: Secondary | ICD-10-CM | POA: Diagnosis present

## 2014-08-15 DIAGNOSIS — K819 Cholecystitis, unspecified: Secondary | ICD-10-CM

## 2014-08-15 DIAGNOSIS — F17210 Nicotine dependence, cigarettes, uncomplicated: Secondary | ICD-10-CM | POA: Diagnosis present

## 2014-08-15 DIAGNOSIS — R1031 Right lower quadrant pain: Secondary | ICD-10-CM

## 2014-08-15 HISTORY — DX: Gastro-esophageal reflux disease without esophagitis: K21.9

## 2014-08-15 LAB — BASIC METABOLIC PANEL
Anion gap: 10 (ref 5–15)
BUN: 8 mg/dL (ref 6–23)
CO2: 28 mmol/L (ref 19–32)
Calcium: 8.6 mg/dL (ref 8.4–10.5)
Chloride: 104 mmol/L (ref 96–112)
Creatinine, Ser: 0.89 mg/dL (ref 0.50–1.10)
GFR calc Af Amer: >90 mL/min (ref >90)
GFR, EST NON AFRICAN AMERICAN: 82 mL/min — AB (ref >90)
Glucose, Bld: 89 mg/dL (ref 70–99)
POTASSIUM: 4.1 mmol/L (ref 3.5–5.1)
Sodium: 142 mmol/L (ref 135–145)

## 2014-08-15 LAB — HEPATIC FUNCTION PANEL
ALBUMIN: 3.4 g/dL — AB (ref 3.5–5.2)
ALT: 23 U/L (ref 0–35)
AST: 22 U/L (ref 0–37)
Alkaline Phosphatase: 50 U/L (ref 39–117)
BILIRUBIN TOTAL: 0.7 mg/dL (ref 0.3–1.2)
Bilirubin, Direct: 0.1 mg/dL (ref 0.0–0.5)
Indirect Bilirubin: 0.6 mg/dL (ref 0.3–0.9)
Total Protein: 7.1 g/dL (ref 6.0–8.3)

## 2014-08-15 LAB — URINALYSIS, ROUTINE W REFLEX MICROSCOPIC
BILIRUBIN URINE: NEGATIVE
Glucose, UA: NEGATIVE mg/dL
Hgb urine dipstick: NEGATIVE
KETONES UR: NEGATIVE mg/dL
LEUKOCYTES UA: NEGATIVE
NITRITE: NEGATIVE
PROTEIN: NEGATIVE mg/dL
Specific Gravity, Urine: 1.02 (ref 1.005–1.030)
Urobilinogen, UA: 0.2 mg/dL (ref 0.0–1.0)
pH: 6.5 (ref 5.0–8.0)

## 2014-08-15 LAB — CBC WITH DIFFERENTIAL/PLATELET
BASOS ABS: 0 10*3/uL (ref 0.0–0.1)
Basophils Relative: 0 % (ref 0–1)
EOS ABS: 0.1 10*3/uL (ref 0.0–0.7)
EOS PCT: 1 % (ref 0–5)
HCT: 44.7 % (ref 36.0–46.0)
HEMOGLOBIN: 14.8 g/dL (ref 12.0–15.0)
LYMPHS PCT: 25 % (ref 12–46)
Lymphs Abs: 3.4 10*3/uL (ref 0.7–4.0)
MCH: 30.5 pg (ref 26.0–34.0)
MCHC: 33.1 g/dL (ref 30.0–36.0)
MCV: 92.2 fL (ref 78.0–100.0)
Monocytes Absolute: 0.4 10*3/uL (ref 0.1–1.0)
Monocytes Relative: 3 % (ref 3–12)
Neutro Abs: 9.9 10*3/uL — ABNORMAL HIGH (ref 1.7–7.7)
Neutrophils Relative %: 71 % (ref 43–77)
PLATELETS: 330 10*3/uL (ref 150–400)
RBC: 4.85 MIL/uL (ref 3.87–5.11)
RDW: 14 % (ref 11.5–15.5)
WBC: 13.8 10*3/uL — ABNORMAL HIGH (ref 4.0–10.5)

## 2014-08-15 LAB — PREGNANCY, URINE: Preg Test, Ur: NEGATIVE

## 2014-08-15 LAB — LIPASE, BLOOD: Lipase: 20 U/L (ref 11–59)

## 2014-08-15 MED ORDER — ACETAMINOPHEN 325 MG PO TABS
650.0000 mg | ORAL_TABLET | Freq: Four times a day (QID) | ORAL | Status: DC | PRN
  Administered 2014-08-16: 650 mg via ORAL
  Filled 2014-08-15: qty 2

## 2014-08-15 MED ORDER — PANTOPRAZOLE SODIUM 40 MG IV SOLR
40.0000 mg | Freq: Two times a day (BID) | INTRAVENOUS | Status: DC
  Administered 2014-08-15 – 2014-08-17 (×4): 40 mg via INTRAVENOUS
  Filled 2014-08-15 (×4): qty 40

## 2014-08-15 MED ORDER — HYDROMORPHONE HCL 1 MG/ML IJ SOLN
0.5000 mg | Freq: Once | INTRAMUSCULAR | Status: AC
  Administered 2014-08-15: 0.5 mg via INTRAVENOUS
  Filled 2014-08-15: qty 1

## 2014-08-15 MED ORDER — ONDANSETRON HCL 4 MG/2ML IJ SOLN
4.0000 mg | Freq: Once | INTRAMUSCULAR | Status: AC
  Administered 2014-08-15: 4 mg via INTRAVENOUS
  Filled 2014-08-15: qty 2

## 2014-08-15 MED ORDER — SODIUM CHLORIDE 0.9 % IJ SOLN
3.0000 mL | Freq: Two times a day (BID) | INTRAMUSCULAR | Status: DC
  Administered 2014-08-16 – 2014-08-17 (×3): 3 mL via INTRAVENOUS

## 2014-08-15 MED ORDER — CIPROFLOXACIN IN D5W 400 MG/200ML IV SOLN
400.0000 mg | Freq: Once | INTRAVENOUS | Status: AC
  Administered 2014-08-15: 400 mg via INTRAVENOUS
  Filled 2014-08-15: qty 200

## 2014-08-15 MED ORDER — PANTOPRAZOLE SODIUM 40 MG IV SOLR
40.0000 mg | Freq: Once | INTRAVENOUS | Status: AC
  Administered 2014-08-15: 40 mg via INTRAVENOUS
  Filled 2014-08-15: qty 40

## 2014-08-15 MED ORDER — HYDROMORPHONE HCL 1 MG/ML IJ SOLN
1.0000 mg | Freq: Once | INTRAMUSCULAR | Status: AC
  Administered 2014-08-15: 1 mg via INTRAVENOUS
  Filled 2014-08-15: qty 1

## 2014-08-15 MED ORDER — SODIUM CHLORIDE 0.9 % IV SOLN: INTRAVENOUS | Status: AC

## 2014-08-15 MED ORDER — ACETAMINOPHEN 650 MG RE SUPP: 650.0000 mg | Freq: Four times a day (QID) | RECTAL | Status: DC | PRN

## 2014-08-15 MED ORDER — IOHEXOL 300 MG/ML  SOLN
25.0000 mL | Freq: Once | INTRAMUSCULAR | Status: AC | PRN
  Administered 2014-08-15: 25 mL via ORAL

## 2014-08-15 MED ORDER — IOHEXOL 300 MG/ML  SOLN
150.0000 mL | Freq: Once | INTRAMUSCULAR | Status: AC | PRN
  Administered 2014-08-15: 150 mL via INTRAVENOUS

## 2014-08-15 NOTE — ED Provider Notes (Signed)
CSN: [id number]     Arrival date & time 08/15/14  1310 History   First MD Initiated Contact with Patient 08/15/14 1744     Chief Complaint  Patient presents with  . Abdominal Pain     (Consider location/radiation/quality/duration/timing/severity/associated sxs/prior Treatment) Patient is a 39 y.o. female presenting with abdominal pain. The history is provided by the patient (the pt complains of abdominal pain ruq).  Abdominal Pain Pain location:  Epigastric Pain quality: aching   Pain radiates to:  Does not radiate Pain severity:  Moderate Onset quality:  Sudden Timing:  Constant Progression:  Waxing and waning Associated symptoms: no chest pain, no cough, no diarrhea, no fatigue and no hematuria     Past Medical History  Diagnosis Date  . Hx of chlamydia infection   . HSV-2 (herpes simplex virus 2) infection   . Obesity   . Pain in right hip 08/09/2012  . Reflux 01/19/2014  . Nexplanon insertion 02/07/2014    Inserted left arm 02/07/14  . Nexplanon removal 02/07/2014  . Acid reflux    Past Surgical History  Procedure Laterality Date  . Cesarean section    . Colonoscopy N/A 09/01/2013    Procedure: COLONOSCOPY;  Surgeon: Rogene Houston, MD;  Location: [facility];  Service: Endoscopy;  Laterality: N/A;  100   Family History  Problem Relation Age of Onset  . Hypertension Mother   . Diabetes Mother   . COPD Mother   . Hypertension Sister   . Cancer Maternal Grandfather   . Cancer Father    History  Substance Use Topics  . Smoking status: Current Some Day Smoker -- 0.50 packs/day    Types: Cigarettes  . Smokeless tobacco: Never Used     Comment: on the weekends when she drinks  . Alcohol Use: Yes     Comment: on weekends   OB History    Gravida Para Term Preterm AB TAB SAB Ectopic Multiple Living   1 1 1       1      Review of Systems  Constitutional: Negative for appetite change and fatigue.  HENT: Negative for congestion, ear discharge and sinus  pressure.   Eyes: Negative for discharge.  Respiratory: Negative for cough.   Cardiovascular: Negative for chest pain.  Gastrointestinal: Positive for abdominal pain. Negative for diarrhea.  Genitourinary: Negative for frequency and hematuria.  Musculoskeletal: Negative for back pain.  Skin: Negative for rash.  Neurological: Negative for seizures and headaches.  Psychiatric/Behavioral: Negative for hallucinations.      Allergies  Keflex  Home Medications   Prior to Admission medications   Medication Sig Start Date End Date Taking? Authorizing Provider  etonogestrel (IMPLANON) 68 MG IMPL implant Inject 1 each into the skin once.   Yes Historical Provider, MD  azithromycin (ZITHROMAX Z-PAK) 250 MG tablet As directed Patient not taking: Reported on 08/15/2014 08/05/14   Tanna Furry, MD  methocarbamol (ROBAXIN-750) 750 MG tablet Take 1 tablet (750 mg total) by mouth 4 (four) times daily. Patient not taking: Reported on 08/15/2014 04/02/14   Lacretia Leigh, MD  omeprazole (PRILOSEC) 20 MG capsule Take 1 capsule (20 mg total) by mouth daily. Patient not taking: Reported on 08/15/2014 01/19/14   Estill Dooms, NP  promethazine-codeine [facility] WITH CODEINE) 6.25-10 MG/5ML syrup Take 10 mLs by mouth every 6 (six) hours as needed for cough. Patient not taking: Reported on 08/15/2014 08/05/14   Tanna Furry, MD   BP 115/76 mmHg  Pulse 87  Temp(Src) 97.8 F (36.6 C) (Oral)  Resp 20  Ht 5\' 5"  (1.651 m)  Wt 422 lb 12.8 oz (191.781 kg)  BMI 70.36 kg/m2  SpO2 100% Physical Exam  Constitutional: She is oriented to person, place, and time. She appears well-developed.  HENT:  Head: Normocephalic.  Eyes: Conjunctivae and EOM are normal. No scleral icterus.  Neck: Neck supple. No thyromegaly present.  Cardiovascular: Normal rate and regular rhythm.  Exam reveals no gallop and no friction rub.   No murmur heard. Pulmonary/Chest: No stridor. She has no wheezes. She has no rales. She exhibits no  tenderness.  Abdominal: She exhibits no distension. There is tenderness. There is no rebound.  Tender ruq  Musculoskeletal: Normal range of motion. She exhibits no edema.  Lymphadenopathy:    She has no cervical adenopathy.  Neurological: She is oriented to person, place, and time. She exhibits normal muscle tone. Coordination normal.  Skin: No rash noted. No erythema.  Psychiatric: She has a normal mood and affect. Her behavior is normal.    ED Course  Procedures (including critical care time) Labs Review Labs Reviewed  BASIC METABOLIC PANEL - Abnormal; Notable for the following:    GFR calc non Af Amer 82 (*)    All other components within normal limits  CBC WITH DIFFERENTIAL/PLATELET - Abnormal; Notable for the following:    WBC 13.8 (*)    Neutro Abs 9.9 (*)    All other components within normal limits  HEPATIC FUNCTION PANEL - Abnormal; Notable for the following:    Albumin 3.4 (*)    All other components within normal limits  URINALYSIS, ROUTINE W REFLEX MICROSCOPIC  LIPASE, BLOOD  PREGNANCY, URINE    Imaging Review Ct Abdomen Pelvis W Contrast  08/15/2014   CLINICAL DATA:  40 year old female with right upper quadrant pain progressive over the past 2 weeks.  EXAM: CT ABDOMEN AND PELVIS WITH CONTRAST  TECHNIQUE: Multidetector CT imaging of the abdomen and pelvis was performed using the standard protocol following bolus administration of intravenous contrast.  CONTRAST:  100 mL Omnipaque 300  COMPARISON:  Prior CT abdomen/ pelvis 03/22/2011  FINDINGS: Lower Chest: The lung bases are clear. Visualized cardiac structures are within normal limits for size. No pericardial effusion. Unremarkable visualized distal thoracic esophagus.  Abdomen: Unremarkable CT appearance of the stomach, duodenum, spleen, adrenal glands and pancreas. Normal hepatic contour and morphology. Diffuse low attenuation of the hepatic parenchyma consistent with fatty infiltration. Nonspecific 1.5 cm  low-attenuation lesion in the inferior aspect of hepatic segment 2. This structure is water attenuation most consistent with a simple cyst. High attenuation material layers within the gallbladder lumen. No radiopaque stones. No gallbladder wall thickening or pericholecystic fluid. Negative for biliary duct dilatation.  Unremarkable appearance of the bilateral kidneys. No focal solid lesion, hydronephrosis or nephrolithiasis. Colonic diverticular disease without CT evidence of active inflammation. No evidence of obstruction or focal bowel wall thickening. Normal appendix in the right lower quadrant. The terminal ileum is unremarkable. No free fluid or suspicious adenopathy.  Pelvis: Unremarkable appearance of the bladder, uterus and adnexa. No free fluid or suspicious adenopathy.  Bones/Soft Tissues: Diastases recti. Mild edema and skin thickening in the pannus. No acute fracture or aggressive appearing lytic or blastic osseous lesion.  Vascular: No significant atherosclerotic vascular disease, aneurysmal dilatation or acute abnormality.  IMPRESSION: 1. No acute abnormality in the abdomen or pelvis. 2. High attenuation material layering within the gallbladder lumen consistent with sludge. No definite radiopaque cholelithiasis by CT  imaging. 3. Moderate hepatic steatosis. 4. Colonic diverticular disease without CT evidence of active inflammation. 5. Diastases recti.   Electronically Signed   By: Jacqulynn Cadet M.D.   On: 08/15/2014 20:09     EKG Interpretation None      MDM   Final diagnoses:  Pain  Abdominal pain in female   abd pain admit,  Spoke with dr. Arnoldo Morale and he will see the pt tomorrow,  Med admit   Milton Ferguson, MD 08/15/14 2055

## 2014-08-15 NOTE — H&P (Addendum)
Amy [name] is an 39 y.o. female.    Pcp: unassigned  Chief Complaint: abdominal pain HPI: 39 yo female with Jerrye Bushy c/o n/v since 01/2014, and then apparently c/o right upper quadrant pain x 1 week.  Sharp pain, associate with n/v.  Food apparently triggers nausea and vomitting. + diarrhea x 37month Pt denies fever, chills, brbpr, black stool, dysuria, hematuria.  Pt was brought to ED for evaluation and found to have cholecystitis. ED consulted surgery who recommended cipro iv and also apparently u/s in am.  They will be by to see the patient. Pt will be admitted for cholecystitis.   Past Medical History  Diagnosis Date  . Hx of chlamydia infection   . HSV-2 (herpes simplex virus 2) infection   . Obesity   . Pain in right hip 08/09/2012  . Reflux 01/19/2014  . Nexplanon insertion 02/07/2014    Inserted left arm 02/07/14  . Nexplanon removal 02/07/2014  . Acid reflux     Past Surgical History  Procedure Laterality Date  . Cesarean section    . Colonoscopy N/A 09/01/2013    Procedure: COLONOSCOPY;  Surgeon: NRogene Houston MD;  Location: [facility];  Service: Endoscopy;  Laterality: N/A;  100    Family History  Problem Relation Age of Onset  . Hypertension Mother   . Diabetes Mother   . COPD Mother   . Hypertension Sister   . Cancer Maternal Grandfather   . Cancer Father    Social History:  reports that she has been smoking Cigarettes.  She has been smoking about 0.50 packs per day. She has never used smokeless tobacco. She reports that she drinks alcohol. She reports that she does not use illicit drugs.  Allergies:  Allergies  Allergen Reactions  . Keflex [Cephalexin] Itching and Rash     (Not in a hospital admission)  Results for orders placed or performed during the hospital encounter of 08/15/14 (from the past 48 hour(s))  Basic metabolic panel     Status: Abnormal   Collection Time: 08/15/14  2:45 PM  Result Value Ref Range   Sodium 142 135 - 145 mmol/L    Potassium 4.1 3.5 - 5.1 mmol/L   Chloride 104 96 - 112 mmol/L   CO2 28 19 - 32 mmol/L   Glucose, Bld 89 70 - 99 mg/dL   BUN 8 6 - 23 mg/dL   Creatinine, Ser 0.89 0.50 - 1.10 mg/dL   Calcium 8.6 8.4 - 10.5 mg/dL   GFR calc non Af Amer 82 (L) >90 mL/min   GFR calc Af Amer >90 >90 mL/min    Comment: (NOTE) The eGFR has been calculated using the CKD EPI equation. This calculation has not been validated in all clinical situations. eGFR's persistently <90 mL/min signify possible Chronic Kidney Disease.    Anion gap 10 5 - 15  CBC WITH DIFFERENTIAL     Status: Abnormal   Collection Time: 08/15/14  2:45 PM  Result Value Ref Range   WBC 13.8 (H) 4.0 - 10.5 K/uL   RBC 4.85 3.87 - 5.11 MIL/uL   Hemoglobin 14.8 12.0 - 15.0 g/dL   HCT 44.7 36.0 - 46.0 %   MCV 92.2 78.0 - 100.0 fL   MCH 30.5 26.0 - 34.0 pg   MCHC 33.1 30.0 - 36.0 g/dL   RDW 14.0 11.5 - 15.5 %   Platelets 330 150 - 400 K/uL   Neutrophils Relative % 71 43 - 77 %   Neutro  Abs 9.9 (H) 1.7 - 7.7 K/uL   Lymphocytes Relative 25 12 - 46 %   Lymphs Abs 3.4 0.7 - 4.0 K/uL   Monocytes Relative 3 3 - 12 %   Monocytes Absolute 0.4 0.1 - 1.0 K/uL   Eosinophils Relative 1 0 - 5 %   Eosinophils Absolute 0.1 0.0 - 0.7 K/uL   Basophils Relative 0 0 - 1 %   Basophils Absolute 0.0 0.0 - 0.1 K/uL  Lipase, blood     Status: None   Collection Time: 08/15/14  2:45 PM  Result Value Ref Range   Lipase 20 11 - 59 U/L  Hepatic function panel     Status: Abnormal   Collection Time: 08/15/14  2:45 PM  Result Value Ref Range   Total Protein 7.1 6.0 - 8.3 g/dL   Albumin 3.4 (L) 3.5 - 5.2 g/dL   AST 22 0 - 37 U/L   ALT 23 0 - 35 U/L   Alkaline Phosphatase 50 39 - 117 U/L   Total Bilirubin 0.7 0.3 - 1.2 mg/dL   Bilirubin, Direct 0.1 0.0 - 0.5 mg/dL   Indirect Bilirubin 0.6 0.3 - 0.9 mg/dL  Urinalysis, Routine w reflex microscopic     Status: None   Collection Time: 08/15/14  7:45 PM  Result Value Ref Range   Color, Urine YELLOW YELLOW    APPearance CLEAR CLEAR   Specific Gravity, Urine 1.020 1.005 - 1.030   pH 6.5 5.0 - 8.0   Glucose, UA NEGATIVE NEGATIVE mg/dL   Hgb urine dipstick NEGATIVE NEGATIVE   Bilirubin Urine NEGATIVE NEGATIVE   Ketones, ur NEGATIVE NEGATIVE mg/dL   Protein, ur NEGATIVE NEGATIVE mg/dL   Urobilinogen, UA 0.2 0.0 - 1.0 mg/dL   Nitrite NEGATIVE NEGATIVE   Leukocytes, UA NEGATIVE NEGATIVE    Comment: MICROSCOPIC NOT DONE ON URINES WITH NEGATIVE PROTEIN, BLOOD, LEUKOCYTES, NITRITE, OR GLUCOSE <1000 mg/dL.  Pregnancy, urine     Status: None   Collection Time: 08/15/14  7:45 PM  Result Value Ref Range   Preg Test, Ur NEGATIVE NEGATIVE    Comment:        THE SENSITIVITY OF THIS METHODOLOGY IS >20 mIU/mL.    Ct Abdomen Pelvis W Contrast  08/15/2014   CLINICAL DATA:  39 year old female with right upper quadrant pain progressive over the past 2 weeks.  EXAM: CT ABDOMEN AND PELVIS WITH CONTRAST  TECHNIQUE: Multidetector CT imaging of the abdomen and pelvis was performed using the standard protocol following bolus administration of intravenous contrast.  CONTRAST:  100 mL Omnipaque 300  COMPARISON:  Prior CT abdomen/ pelvis 03/22/2011  FINDINGS: Lower Chest: The lung bases are clear. Visualized cardiac structures are within normal limits for size. No pericardial effusion. Unremarkable visualized distal thoracic esophagus.  Abdomen: Unremarkable CT appearance of the stomach, duodenum, spleen, adrenal glands and pancreas. Normal hepatic contour and morphology. Diffuse low attenuation of the hepatic parenchyma consistent with fatty infiltration. Nonspecific 1.5 cm low-attenuation lesion in the inferior aspect of hepatic segment 2. This structure is water attenuation most consistent with a simple cyst. High attenuation material layers within the gallbladder lumen. No radiopaque stones. No gallbladder wall thickening or pericholecystic fluid. Negative for biliary duct dilatation.  Unremarkable appearance of the bilateral  kidneys. No focal solid lesion, hydronephrosis or nephrolithiasis. Colonic diverticular disease without CT evidence of active inflammation. No evidence of obstruction or focal bowel wall thickening. Normal appendix in the right lower quadrant. The terminal ileum is unremarkable. No free fluid  or suspicious adenopathy.  Pelvis: Unremarkable appearance of the bladder, uterus and adnexa. No free fluid or suspicious adenopathy.  Bones/Soft Tissues: Diastases recti. Mild edema and skin thickening in the pannus. No acute fracture or aggressive appearing lytic or blastic osseous lesion.  Vascular: No significant atherosclerotic vascular disease, aneurysmal dilatation or acute abnormality.  IMPRESSION: 1. No acute abnormality in the abdomen or pelvis. 2. High attenuation material layering within the gallbladder lumen consistent with sludge. No definite radiopaque cholelithiasis by CT imaging. 3. Moderate hepatic steatosis. 4. Colonic diverticular disease without CT evidence of active inflammation. 5. Diastases recti.   Electronically Signed   By: Jacqulynn Cadet M.D.   On: 08/15/2014 20:09    Review of Systems  Constitutional: Negative.   HENT: Negative.   Eyes: Negative.   Respiratory: Negative.   Cardiovascular: Negative.   Gastrointestinal: Positive for heartburn, nausea, vomiting, abdominal pain and diarrhea. Negative for constipation, blood in stool and melena.  Genitourinary: Negative.   Musculoskeletal: Negative.   Skin: Negative.   Neurological: Negative.   Endo/Heme/Allergies: Negative.   Psychiatric/Behavioral: Negative.     Blood pressure 115/76, pulse 87, temperature 97.8 F (36.6 C), temperature source Oral, resp. rate 20, height _0  (1.651 m), weight 191.781 kg (422 lb 12.8 oz), SpO2 100 %. Physical Exam  Constitutional: She is oriented to person, place, and time. She appears well-developed and well-nourished.  HENT:  Head: Normocephalic and atraumatic.  Mouth/Throat: No  oropharyngeal exudate.  Eyes: Conjunctivae and EOM are normal. Pupils are equal, round, and reactive to light. No scleral icterus.  Neck: Normal range of motion. Neck supple. No JVD present. No tracheal deviation present. No thyromegaly present.  Cardiovascular: Normal rate and regular rhythm.  Exam reveals no gallop and no friction rub.   No murmur heard. Respiratory: Breath sounds normal. No respiratory distress. She has no wheezes. She has no rales.  GI: Soft. Bowel sounds are normal. She exhibits no distension. There is tenderness. There is no rebound and no guarding.  ruq tenderness  Musculoskeletal: Normal range of motion. She exhibits no edema or tenderness.  Lymphadenopathy:    She has no cervical adenopathy.  Neurological: She is alert and oriented to person, place, and time. She has normal reflexes. She displays normal reflexes. No cranial nerve deficit. She exhibits normal muscle tone. Coordination normal.  Skin: Skin is warm and dry. No rash noted. No erythema. No pallor.  Psychiatric: She has a normal mood and affect. Her behavior is normal. Judgment and thought content normal.     Assessment/Plan Abdominal pain secondary to cholecysitis Appreciate surgery input NPO  Iv cipro as per surgery ordered by ED RUQ u/s in am Check INR, PTT, CXR, u/a Pt is medically cleared to have surgery  Leukocytosis secondary to cholecystitis  Jerrye Bushy protonix 40 mg iv bid  Diarrhea Stool for fecal leukocytes, culture, c. Diff  Probable OSA Needs outpatient f/u  DVT scd     Jani Gravel 08/15/2014, 9:28 PM

## 2014-08-15 NOTE — ED Notes (Addendum)
Patient complaining of abdominal pain in upper right quadrant x 2 weeks worsening x 1 week.

## 2014-08-16 ENCOUNTER — Inpatient Hospital Stay (HOSPITAL_COMMUNITY): Payer: Medicaid Other

## 2014-08-16 DIAGNOSIS — D72829 Elevated white blood cell count, unspecified: Secondary | ICD-10-CM

## 2014-08-16 DIAGNOSIS — K8001 Calculus of gallbladder with acute cholecystitis with obstruction: Secondary | ICD-10-CM

## 2014-08-16 LAB — CBC WITH DIFFERENTIAL/PLATELET
Basophils Absolute: 0 10*3/uL (ref 0.0–0.1)
Basophils Relative: 0 % (ref 0–1)
Eosinophils Absolute: 0.1 10*3/uL (ref 0.0–0.7)
Eosinophils Relative: 1 % (ref 0–5)
HCT: 42.1 % (ref 36.0–46.0)
HEMOGLOBIN: 13.4 g/dL (ref 12.0–15.0)
LYMPHS PCT: 16 % (ref 12–46)
Lymphs Abs: 2 10*3/uL (ref 0.7–4.0)
MCH: 29.6 pg (ref 26.0–34.0)
MCHC: 31.8 g/dL (ref 30.0–36.0)
MCV: 92.9 fL (ref 78.0–100.0)
Monocytes Absolute: 0.5 10*3/uL (ref 0.1–1.0)
Monocytes Relative: 4 % (ref 3–12)
NEUTROS PCT: 79 % — AB (ref 43–77)
Neutro Abs: 10.2 10*3/uL — ABNORMAL HIGH (ref 1.7–7.7)
Platelets: 308 10*3/uL (ref 150–400)
RBC: 4.53 MIL/uL (ref 3.87–5.11)
RDW: 14.1 % (ref 11.5–15.5)
WBC: 12.8 10*3/uL — ABNORMAL HIGH (ref 4.0–10.5)

## 2014-08-16 LAB — COMPREHENSIVE METABOLIC PANEL
ALT: 20 U/L (ref 0–35)
ANION GAP: 7 (ref 5–15)
AST: 18 U/L (ref 0–37)
Albumin: 3.2 g/dL — ABNORMAL LOW (ref 3.5–5.2)
Alkaline Phosphatase: 52 U/L (ref 39–117)
BUN: 7 mg/dL (ref 6–23)
CALCIUM: 8.4 mg/dL (ref 8.4–10.5)
CO2: 29 mmol/L (ref 19–32)
Chloride: 103 mmol/L (ref 96–112)
Creatinine, Ser: 0.88 mg/dL (ref 0.50–1.10)
GFR calc Af Amer: >90 mL/min (ref >90)
GFR calc non Af Amer: 83 mL/min — ABNORMAL LOW (ref >90)
Glucose, Bld: 85 mg/dL (ref 70–99)
Potassium: 4.2 mmol/L (ref 3.5–5.1)
SODIUM: 139 mmol/L (ref 135–145)
Total Bilirubin: 0.6 mg/dL (ref 0.3–1.2)
Total Protein: 7 g/dL (ref 6.0–8.3)

## 2014-08-16 LAB — PROTIME-INR
INR: 1.13 (ref 0.00–1.49)
Prothrombin Time: 14.7 seconds (ref 11.6–15.2)

## 2014-08-16 LAB — APTT: aPTT: 31 seconds (ref 24–37)

## 2014-08-16 MED ORDER — ONDANSETRON HCL 4 MG/2ML IJ SOLN: 4.0000 mg | Freq: Four times a day (QID) | INTRAMUSCULAR | Status: DC | PRN

## 2014-08-16 MED ORDER — HYDROMORPHONE HCL 1 MG/ML IJ SOLN
0.5000 mg | INTRAMUSCULAR | Status: DC | PRN
  Administered 2014-08-16: 0.5 mg via INTRAVENOUS
  Filled 2014-08-16: qty 1

## 2014-08-16 NOTE — Progress Notes (Signed)
[facility] PROGRESS NOTE Interim History: 39 yo female with Amy [name] c/o n/v since 01/2014, and then apparently c/o right upper quadrant pain x 1 week,  Pt was brought to ED for evaluation and found to have cholecystitis.    Assessment/Plan: Abdominal pain secondary to  Calculus of gallbladder with acute cholecystitis - Patient is Place nothing by mouth, surgery has been consulted. - Patient was started empirically on IV Cipro on 08/16/2014. - CT abdomen pelvis shows no stones, abdominal ultrasound pending. - I have discussed the risk and benefits of surgery with the patient's and he agrees to proceed.  Leukocytosis: Most likely due to acute cholecystitis.  GERD Continue Protonix.  Code Status: full Family Communication: none  Disposition Plan: 2-3 days   Consultants:  Surgery  Procedures:  CT abdomen pelvis  Abdominal ultrasound  Antibiotics:  IV ciprofloxacin started on 08/16/2014  HPI/Subjective: She was she continues to have right upper quadrant pain  Objective: Filed Vitals:   08/15/14 2001 08/15/14 2207 08/15/14 2226 08/16/14 0446  BP: 115/76 129/89 142/110 129/92  Pulse: 87 81 78 106  Temp: 97.8 F (36.6 C)  98.1 F (36.7 C) 98.7 F (37.1 C)  TempSrc: Oral  Oral Oral  Resp: 20 20 20 20   Height:   5\' 4"  (1.626 m)   Weight:   197.405 kg (435 lb 3.2 oz) 197.451 kg (435 lb 4.8 oz)  SpO2: 100%  100% 99%    Intake/Output Summary (Last 24 hours) at 08/16/14 0836 Last data filed at 08/16/14 0500  Gross per 24 hour  Intake    444 ml  Output      0 ml  Net    444 ml   Filed Weights   08/15/14 1838 08/15/14 2226 08/16/14 0446  Weight: 191.781 kg (422 lb 12.8 oz) 197.405 kg (435 lb 3.2 oz) 197.451 kg (435 lb 4.8 oz)    Exam:  General: Alert, awake, oriented x3, in no acute distress.  HEENT: No bruits, no goiter.  Heart: Regular rate and rhythm. Lungs: Good air movement, clear Abdomen: Soft, positive Murphy sign, nondistended, positive  bowel sounds.  Neuro: Grossly intact, nonfocal.   Data Reviewed: Basic Metabolic Panel:  Recent Labs Lab 08/15/14 1445 08/16/14 0517  NA 142 139  K 4.1 4.2  CL 104 103  CO2 28 29  GLUCOSE 89 85  BUN 8 7  CREATININE 0.89 0.88  CALCIUM 8.6 8.4   Liver Function Tests:  Recent Labs Lab 08/15/14 1445 08/16/14 0517  AST 22 18  ALT 23 20  ALKPHOS 50 52  BILITOT 0.7 0.6  PROT 7.1 7.0  ALBUMIN 3.4* 3.2*    Recent Labs Lab 08/15/14 1445  LIPASE 20   No results for input(s): AMMONIA in the last 168 hours. CBC:  Recent Labs Lab 08/15/14 1445 08/16/14 0517  WBC 13.8* 12.8*  NEUTROABS 9.9* 10.2*  HGB 14.8 13.4  HCT 44.7 42.1  MCV 92.2 92.9  PLT 330 308   Cardiac Enzymes: No results for input(s): CKTOTAL, CKMB, CKMBINDEX, TROPONINI in the last 168 hours. BNP (last 3 results) No results for input(s): BNP in the last 8760 hours.  ProBNP (last 3 results)  Recent Labs  04/02/14 2014  PROBNP 71.8    CBG: No results for input(s): GLUCAP in the last 168 hours.  No results found for this or any previous visit (from the past 240 hour(s)).   Studies: Dg Chest 2 View  08/15/2014   CLINICAL DATA:  Nausea and vomiting  with right upper quadrant pain for 1 week.  EXAM: CHEST  2 VIEW  COMPARISON:  08/15/2014  FINDINGS: The heart size and mediastinal contours are within normal limits. Both lungs are clear. The visualized skeletal structures are unremarkable.  IMPRESSION: No active cardiopulmonary disease.   Electronically Signed   By: Jerilynn Mages.  Shick M.D.   On: 08/15/2014 22:45   Ct Abdomen Pelvis W Contrast  08/15/2014   CLINICAL DATA:  39 year old female with right upper quadrant pain progressive over the past 2 weeks.  EXAM: CT ABDOMEN AND PELVIS WITH CONTRAST  TECHNIQUE: Multidetector CT imaging of the abdomen and pelvis was performed using the standard protocol following bolus administration of intravenous contrast.  CONTRAST:  100 mL Omnipaque 300  COMPARISON:  Prior CT  abdomen/ pelvis 03/22/2011  FINDINGS: Lower Chest: The lung bases are clear. Visualized cardiac structures are within normal limits for size. No pericardial effusion. Unremarkable visualized distal thoracic esophagus.  Abdomen: Unremarkable CT appearance of the stomach, duodenum, spleen, adrenal glands and pancreas. Normal hepatic contour and morphology. Diffuse low attenuation of the hepatic parenchyma consistent with fatty infiltration. Nonspecific 1.5 cm low-attenuation lesion in the inferior aspect of hepatic segment 2. This structure is water attenuation most consistent with a simple cyst. High attenuation material layers within the gallbladder lumen. No radiopaque stones. No gallbladder wall thickening or pericholecystic fluid. Negative for biliary duct dilatation.  Unremarkable appearance of the bilateral kidneys. No focal solid lesion, hydronephrosis or nephrolithiasis. Colonic diverticular disease without CT evidence of active inflammation. No evidence of obstruction or focal bowel wall thickening. Normal appendix in the right lower quadrant. The terminal ileum is unremarkable. No free fluid or suspicious adenopathy.  Pelvis: Unremarkable appearance of the bladder, uterus and adnexa. No free fluid or suspicious adenopathy.  Bones/Soft Tissues: Diastases recti. Mild edema and skin thickening in the pannus. No acute fracture or aggressive appearing lytic or blastic osseous lesion.  Vascular: No significant atherosclerotic vascular disease, aneurysmal dilatation or acute abnormality.  IMPRESSION: 1. No acute abnormality in the abdomen or pelvis. 2. High attenuation material layering within the gallbladder lumen consistent with sludge. No definite radiopaque cholelithiasis by CT imaging. 3. Moderate hepatic steatosis. 4. Colonic diverticular disease without CT evidence of active inflammation. 5. Diastases recti.   Electronically Signed   By: Jacqulynn Cadet M.D.   On: 08/15/2014 20:09    Scheduled Meds: .  pantoprazole (PROTONIX) IV  40 mg Intravenous Q12H  . sodium chloride  3 mL Intravenous Q12H   Continuous Infusions: . sodium chloride      Time Spent: 25 min   Amy [name]  [facility] Pager [phone]. If 7PM-7AM, please contact night-coverage at [email or web address], password [facility] 08/16/2014, 8:36 AM  LOS: 1 day

## 2014-08-16 NOTE — Consult Note (Signed)
Reason for Consult: Right upper quadrant abdominal pain Referring Physician: Hospitalist  Amy [name] is an 39 y.o. female.  HPI: Patient is a 39 year old morbidly obese black female who presented with a one-week history of intermittent right upper quadrant abdominal pain. She states that she has had intermittent epigastric discomfort and reflux for many months. She was having significant coughing episode about the same time that she started having the right upper quadrant abdominal pain. She states that when she drives or is moving, the pain seems to be worse. Her skin in the right upper quadrant is very sensitive to touch. She denies any fever, chills, jaundice. She did have an episode of loose stools yesterday. Initial CAT scan revealed a fatty liver with possible biliary sludge present. Ultrasound the gallbladder was performed today which revealed biliary sludge but no gallbladder wall thickening, normal common bile duct. A fatty liver is noted. She denies a fatty food intolerance.  Past Medical History  Diagnosis Date  . Hx of chlamydia infection   . HSV-2 (herpes simplex virus 2) infection   . Obesity   . Pain in right hip 08/09/2012  . Reflux 01/19/2014  . Nexplanon insertion 02/07/2014    Inserted left arm 02/07/14  . Nexplanon removal 02/07/2014  . Acid reflux     Past Surgical History  Procedure Laterality Date  . Cesarean section    . Colonoscopy N/A 09/01/2013    Procedure: COLONOSCOPY;  Surgeon: Rogene Houston, MD;  Location: [facility];  Service: Endoscopy;  Laterality: N/A;  100    Family History  Problem Relation Age of Onset  . Hypertension Mother   . Diabetes Mother   . COPD Mother   . Hypertension Sister   . Cancer Maternal Grandfather   . Cancer Father     Social History:  reports that she has been smoking Cigarettes.  She has been smoking about 0.50 packs per day. She has never used smokeless tobacco. She reports that she drinks alcohol. She reports that  she does not use illicit drugs.  Allergies:  Allergies  Allergen Reactions  . Keflex [Cephalexin] Itching and Rash    Medications: I have reviewed the patient's current medications.  Results for orders placed or performed during the hospital encounter of 08/15/14 (from the past 48 hour(s))  Basic metabolic panel     Status: Abnormal   Collection Time: 08/15/14  2:45 PM  Result Value Ref Range   Sodium 142 135 - 145 mmol/L   Potassium 4.1 3.5 - 5.1 mmol/L   Chloride 104 96 - 112 mmol/L   CO2 28 19 - 32 mmol/L   Glucose, Bld 89 70 - 99 mg/dL   BUN 8 6 - 23 mg/dL   Creatinine, Ser 0.89 0.50 - 1.10 mg/dL   Calcium 8.6 8.4 - 10.5 mg/dL   GFR calc non Af Amer 82 (L) >90 mL/min   GFR calc Af Amer >90 >90 mL/min    Comment: (NOTE) The eGFR has been calculated using the CKD EPI equation. This calculation has not been validated in all clinical situations. eGFR's persistently <90 mL/min signify possible Chronic Kidney Disease.    Anion gap 10 5 - 15  CBC WITH DIFFERENTIAL     Status: Abnormal   Collection Time: 08/15/14  2:45 PM  Result Value Ref Range   WBC 13.8 (H) 4.0 - 10.5 K/uL   RBC 4.85 3.87 - 5.11 MIL/uL   Hemoglobin 14.8 12.0 - 15.0 g/dL   HCT 44.7  36.0 - 46.0 %   MCV 92.2 78.0 - 100.0 fL   MCH 30.5 26.0 - 34.0 pg   MCHC 33.1 30.0 - 36.0 g/dL   RDW 14.0 11.5 - 15.5 %   Platelets 330 150 - 400 K/uL   Neutrophils Relative % 71 43 - 77 %   Neutro Abs 9.9 (H) 1.7 - 7.7 K/uL   Lymphocytes Relative 25 12 - 46 %   Lymphs Abs 3.4 0.7 - 4.0 K/uL   Monocytes Relative 3 3 - 12 %   Monocytes Absolute 0.4 0.1 - 1.0 K/uL   Eosinophils Relative 1 0 - 5 %   Eosinophils Absolute 0.1 0.0 - 0.7 K/uL   Basophils Relative 0 0 - 1 %   Basophils Absolute 0.0 0.0 - 0.1 K/uL  Lipase, blood     Status: None   Collection Time: 08/15/14  2:45 PM  Result Value Ref Range   Lipase 20 11 - 59 U/L  Hepatic function panel     Status: Abnormal   Collection Time: 08/15/14  2:45 PM  Result Value  Ref Range   Total Protein 7.1 6.0 - 8.3 g/dL   Albumin 3.4 (L) 3.5 - 5.2 g/dL   AST 22 0 - 37 U/L   ALT 23 0 - 35 U/L   Alkaline Phosphatase 50 39 - 117 U/L   Total Bilirubin 0.7 0.3 - 1.2 mg/dL   Bilirubin, Direct 0.1 0.0 - 0.5 mg/dL   Indirect Bilirubin 0.6 0.3 - 0.9 mg/dL  Urinalysis, Routine w reflex microscopic     Status: None   Collection Time: 08/15/14  7:45 PM  Result Value Ref Range   Color, Urine YELLOW YELLOW   APPearance CLEAR CLEAR   Specific Gravity, Urine 1.020 1.005 - 1.030   pH 6.5 5.0 - 8.0   Glucose, UA NEGATIVE NEGATIVE mg/dL   Hgb urine dipstick NEGATIVE NEGATIVE   Bilirubin Urine NEGATIVE NEGATIVE   Ketones, ur NEGATIVE NEGATIVE mg/dL   Protein, ur NEGATIVE NEGATIVE mg/dL   Urobilinogen, UA 0.2 0.0 - 1.0 mg/dL   Nitrite NEGATIVE NEGATIVE   Leukocytes, UA NEGATIVE NEGATIVE    Comment: MICROSCOPIC NOT DONE ON URINES WITH NEGATIVE PROTEIN, BLOOD, LEUKOCYTES, NITRITE, OR GLUCOSE <1000 mg/dL.  Pregnancy, urine     Status: None   Collection Time: 08/15/14  7:45 PM  Result Value Ref Range   Preg Test, Ur NEGATIVE NEGATIVE    Comment:        THE SENSITIVITY OF THIS METHODOLOGY IS >20 mIU/mL.   CBC with Differential/Platelet     Status: Abnormal   Collection Time: 08/16/14  5:17 AM  Result Value Ref Range   WBC 12.8 (H) 4.0 - 10.5 K/uL   RBC 4.53 3.87 - 5.11 MIL/uL   Hemoglobin 13.4 12.0 - 15.0 g/dL   HCT 42.1 36.0 - 46.0 %   MCV 92.9 78.0 - 100.0 fL   MCH 29.6 26.0 - 34.0 pg   MCHC 31.8 30.0 - 36.0 g/dL   RDW 14.1 11.5 - 15.5 %   Platelets 308 150 - 400 K/uL   Neutrophils Relative % 79 (H) 43 - 77 %   Neutro Abs 10.2 (H) 1.7 - 7.7 K/uL   Lymphocytes Relative 16 12 - 46 %   Lymphs Abs 2.0 0.7 - 4.0 K/uL   Monocytes Relative 4 3 - 12 %   Monocytes Absolute 0.5 0.1 - 1.0 K/uL   Eosinophils Relative 1 0 - 5 %   Eosinophils Absolute  0.1 0.0 - 0.7 K/uL   Basophils Relative 0 0 - 1 %   Basophils Absolute 0.0 0.0 - 0.1 K/uL  Comprehensive metabolic panel      Status: Abnormal   Collection Time: 08/16/14  5:17 AM  Result Value Ref Range   Sodium 139 135 - 145 mmol/L   Potassium 4.2 3.5 - 5.1 mmol/L   Chloride 103 96 - 112 mmol/L   CO2 29 19 - 32 mmol/L   Glucose, Bld 85 70 - 99 mg/dL   BUN 7 6 - 23 mg/dL   Creatinine, Ser 0.88 0.50 - 1.10 mg/dL   Calcium 8.4 8.4 - 10.5 mg/dL   Total Protein 7.0 6.0 - 8.3 g/dL   Albumin 3.2 (L) 3.5 - 5.2 g/dL   AST 18 0 - 37 U/L   ALT 20 0 - 35 U/L   Alkaline Phosphatase 52 39 - 117 U/L   Total Bilirubin 0.6 0.3 - 1.2 mg/dL   GFR calc non Af Amer 83 (L) >90 mL/min   GFR calc Af Amer >90 >90 mL/min    Comment: (NOTE) The eGFR has been calculated using the CKD EPI equation. This calculation has not been validated in all clinical situations. eGFR's persistently <90 mL/min signify possible Chronic Kidney Disease.    Anion gap 7 5 - 15  APTT     Status: None   Collection Time: 08/16/14  5:17 AM  Result Value Ref Range   aPTT 31 24 - 37 seconds  Protime-INR     Status: None   Collection Time: 08/16/14  5:17 AM  Result Value Ref Range   Prothrombin Time 14.7 11.6 - 15.2 seconds   INR 1.13 0.00 - 1.49    Dg Chest 2 View  08/15/2014   CLINICAL DATA:  Nausea and vomiting with right upper quadrant pain for 1 week.  EXAM: CHEST  2 VIEW  COMPARISON:  08/15/2014  FINDINGS: The heart size and mediastinal contours are within normal limits. Both lungs are clear. The visualized skeletal structures are unremarkable.  IMPRESSION: No active cardiopulmonary disease.   Electronically Signed   By: Jerilynn Mages.  Shick M.D.   On: 08/15/2014 22:45   Ct Abdomen Pelvis W Contrast  08/15/2014   CLINICAL DATA:  39 year old female with right upper quadrant pain progressive over the past 2 weeks.  EXAM: CT ABDOMEN AND PELVIS WITH CONTRAST  TECHNIQUE: Multidetector CT imaging of the abdomen and pelvis was performed using the standard protocol following bolus administration of intravenous contrast.  CONTRAST:  100 mL Omnipaque 300   COMPARISON:  Prior CT abdomen/ pelvis 03/22/2011  FINDINGS: Lower Chest: The lung bases are clear. Visualized cardiac structures are within normal limits for size. No pericardial effusion. Unremarkable visualized distal thoracic esophagus.  Abdomen: Unremarkable CT appearance of the stomach, duodenum, spleen, adrenal glands and pancreas. Normal hepatic contour and morphology. Diffuse low attenuation of the hepatic parenchyma consistent with fatty infiltration. Nonspecific 1.5 cm low-attenuation lesion in the inferior aspect of hepatic segment 2. This structure is water attenuation most consistent with a simple cyst. High attenuation material layers within the gallbladder lumen. No radiopaque stones. No gallbladder wall thickening or pericholecystic fluid. Negative for biliary duct dilatation.  Unremarkable appearance of the bilateral kidneys. No focal solid lesion, hydronephrosis or nephrolithiasis. Colonic diverticular disease without CT evidence of active inflammation. No evidence of obstruction or focal bowel wall thickening. Normal appendix in the right lower quadrant. The terminal ileum is unremarkable. No free fluid or suspicious adenopathy.  Pelvis: Unremarkable appearance of the bladder, uterus and adnexa. No free fluid or suspicious adenopathy.  Bones/Soft Tissues: Diastases recti. Mild edema and skin thickening in the pannus. No acute fracture or aggressive appearing lytic or blastic osseous lesion.  Vascular: No significant atherosclerotic vascular disease, aneurysmal dilatation or acute abnormality.  IMPRESSION: 1. No acute abnormality in the abdomen or pelvis. 2. High attenuation material layering within the gallbladder lumen consistent with sludge. No definite radiopaque cholelithiasis by CT imaging. 3. Moderate hepatic steatosis. 4. Colonic diverticular disease without CT evidence of active inflammation. 5. Diastases recti.   Electronically Signed   By: Jacqulynn Cadet M.D.   On: 08/15/2014 20:09    US Abdomen Limited  08/16/2014   CLINICAL DATA:  One month history of right upper quadrant region pain  EXAM: US ABDOMEN LIMITED - RIGHT UPPER QUADRANT  COMPARISON:  CT abdomen and pelvis August 15, 2014  FINDINGS: Gallbladder:  There is a mild degree of sludge in the gallbladder. No gallstones or wall thickening visualized. No pericholecystic fluid. No sonographic Murphy sign noted.  Common bile duct:  Diameter: 4 mm. No intrahepatic or extrahepatic biliary duct dilatation.  Liver:  No focal lesion identified. Liver echogenicity is diffusely increased.  IMPRESSION: Small amount of sludge seen in gallbladder. No gallstones appreciable.  Increased liver echogenicity is consistent with hepatic steatosis. While no focal liver lesions are identified, it must be cautioned that the sensitivity of ultrasound for focal liver lesions is diminished in this circumstance.   Electronically Signed   By: Lowella Grip III M.D.   On: 08/16/2014 10:38    ROS: See chart Blood pressure 129/92, pulse 106, temperature 98.7 F (37.1 C), temperature source Oral, resp. rate 20, height $RemoveBe'5\' 4"'aExCeDVNK$  (1.626 m), weight 197.451 kg (435 lb 4.8 oz), SpO2 99 %. Physical Exam: Pleasant black female in no acute distress. Abdomen is soft with very sensitive skin along the right costal margin. I do not see any rash in a bandlike fashion. Due to her body habitus, deep palpation did not reveal any rigidity or palpable mass.  Assessment/Plan: Impression: Right upper quadrant abdominal pain, history of reflux. Overall, her symptoms are atypical for biliary colic. Liver enzyme tests are all within normal limits. Her examination is are proportionate to symptoms of cholecystitis. She is at high risk for general anesthesia due to her body habitus, thus I do not feel surgery is indicated at this time. She may need an hepatobiliary scan in the future. She is still being worked up for diarrhea. Would also monitor for shingles. Will advance to low-fat  diet. May need further workup as an outpatient by gastroenterology. Will reassess in a.m. Patient and family were agreeable to the treatment plan.  Keilynn Marano A 08/16/2014, 11:34 AM

## 2014-08-17 ENCOUNTER — Inpatient Hospital Stay (HOSPITAL_COMMUNITY): Payer: Medicaid Other

## 2014-08-17 DIAGNOSIS — G8929 Other chronic pain: Secondary | ICD-10-CM

## 2014-08-17 DIAGNOSIS — R1031 Right lower quadrant pain: Secondary | ICD-10-CM

## 2014-08-17 MED ORDER — TRAMADOL HCL 50 MG PO TABS: 100.0000 mg | ORAL_TABLET | Freq: Two times a day (BID) | ORAL | Status: DC | PRN

## 2014-08-17 MED ORDER — IOHEXOL 350 MG/ML SOLN
120.0000 mL | Freq: Once | INTRAVENOUS | Status: AC | PRN
  Administered 2014-08-17: 120 mL via INTRAVENOUS

## 2014-08-17 NOTE — Progress Notes (Signed)
[facility] PROGRESS NOTE Interim History: 39 yo female with Jerrye Bushy c/o n/v since 01/2014, and then apparently c/o right upper quadrant pain x 1 week,  Pt was brought to ED for evaluation and found to have cholecystitis.    Assessment/Plan: Abdominal pain: - Abdominal ultrasound showed no acute cholecystitis, surgery was consulted they recommended no further intervention. - She was tachycardic on admission not hypoxic, she is on estrogen and smokes. I will go to CT imaging of the chest to rule out a PE.  Leukocytosis:- - Improving.  GERD Continue Protonix.  Code Status: full Family Communication: none  Disposition Plan: 2-3 days   Consultants:  Surgery  Procedures:  CT abdomen pelvis  Abdominal ultrasound  Ct angio  Antibiotics:  IV ciprofloxacin started on 08/16/2014  HPI/Subjective: She continues to have right upper quadrant pain and chest pain  Objective: Filed Vitals:   08/16/14 0446 08/16/14 1334 08/16/14 2146 08/17/14 0630  BP: 129/92 130/81 115/67 128/73  Pulse: 106 89 99 98  Temp: 98.7 F (37.1 C) 98.6 F (37 C) 98.9 F (37.2 C) 99.3 F (37.4 C)  TempSrc: Oral Oral Oral Oral  Resp: 20 20 20 20   Height:      Weight: 197.451 kg (435 lb 4.8 oz)   196.68 kg (433 lb 9.6 oz)  SpO2: 99% 96% 97% 97%    Intake/Output Summary (Last 24 hours) at 08/17/14 0837 Last data filed at 08/16/14 1550  Gross per 24 hour  Intake      3 ml  Output      0 ml  Net      3 ml   Filed Weights   08/15/14 2226 08/16/14 0446 08/17/14 0630  Weight: 197.405 kg (435 lb 3.2 oz) 197.451 kg (435 lb 4.8 oz) 196.68 kg (433 lb 9.6 oz)    Exam:  General: Alert, awake, oriented x3, in no acute distress.  HEENT: No bruits, no goiter.  Heart: Regular rate and rhythm. Lungs: Good air movement, clear Abdomen: Soft, positive nondistended, positive bowel sounds.  Neuro: Grossly intact, nonfocal.   Data Reviewed: Basic Metabolic Panel:  Recent Labs Lab  08/15/14 1445 08/16/14 0517  NA 142 139  K 4.1 4.2  CL 104 103  CO2 28 29  GLUCOSE 89 85  BUN 8 7  CREATININE 0.89 0.88  CALCIUM 8.6 8.4   Liver Function Tests:  Recent Labs Lab 08/15/14 1445 08/16/14 0517  AST 22 18  ALT 23 20  ALKPHOS 50 52  BILITOT 0.7 0.6  PROT 7.1 7.0  ALBUMIN 3.4* 3.2*    Recent Labs Lab 08/15/14 1445  LIPASE 20   No results for input(s): AMMONIA in the last 168 hours. CBC:  Recent Labs Lab 08/15/14 1445 08/16/14 0517  WBC 13.8* 12.8*  NEUTROABS 9.9* 10.2*  HGB 14.8 13.4  HCT 44.7 42.1  MCV 92.2 92.9  PLT 330 308   Cardiac Enzymes: No results for input(s): CKTOTAL, CKMB, CKMBINDEX, TROPONINI in the last 168 hours. BNP (last 3 results) No results for input(s): BNP in the last 8760 hours.  ProBNP (last 3 results)  Recent Labs  04/02/14 2014  PROBNP 71.8    CBG: No results for input(s): GLUCAP in the last 168 hours.  No results found for this or any previous visit (from the past 240 hour(s)).   Studies: Dg Chest 2 View  08/15/2014   CLINICAL DATA:  Nausea and vomiting with right upper quadrant pain for 1 week.  EXAM: CHEST  2  VIEW  COMPARISON:  08/15/2014  FINDINGS: The heart size and mediastinal contours are within normal limits. Both lungs are clear. The visualized skeletal structures are unremarkable.  IMPRESSION: No active cardiopulmonary disease.   Electronically Signed   By: Jerilynn Mages.  Shick M.D.   On: 08/15/2014 22:45   Ct Abdomen Pelvis W Contrast  08/15/2014   CLINICAL DATA:  39 year old female with right upper quadrant pain progressive over the past 2 weeks.  EXAM: CT ABDOMEN AND PELVIS WITH CONTRAST  TECHNIQUE: Multidetector CT imaging of the abdomen and pelvis was performed using the standard protocol following bolus administration of intravenous contrast.  CONTRAST:  100 mL Omnipaque 300  COMPARISON:  Prior CT abdomen/ pelvis 03/22/2011  FINDINGS: Lower Chest: The lung bases are clear. Visualized cardiac structures are  within normal limits for size. No pericardial effusion. Unremarkable visualized distal thoracic esophagus.  Abdomen: Unremarkable CT appearance of the stomach, duodenum, spleen, adrenal glands and pancreas. Normal hepatic contour and morphology. Diffuse low attenuation of the hepatic parenchyma consistent with fatty infiltration. Nonspecific 1.5 cm low-attenuation lesion in the inferior aspect of hepatic segment 2. This structure is water attenuation most consistent with a simple cyst. High attenuation material layers within the gallbladder lumen. No radiopaque stones. No gallbladder wall thickening or pericholecystic fluid. Negative for biliary duct dilatation.  Unremarkable appearance of the bilateral kidneys. No focal solid lesion, hydronephrosis or nephrolithiasis. Colonic diverticular disease without CT evidence of active inflammation. No evidence of obstruction or focal bowel wall thickening. Normal appendix in the right lower quadrant. The terminal ileum is unremarkable. No free fluid or suspicious adenopathy.  Pelvis: Unremarkable appearance of the bladder, uterus and adnexa. No free fluid or suspicious adenopathy.  Bones/Soft Tissues: Diastases recti. Mild edema and skin thickening in the pannus. No acute fracture or aggressive appearing lytic or blastic osseous lesion.  Vascular: No significant atherosclerotic vascular disease, aneurysmal dilatation or acute abnormality.  IMPRESSION: 1. No acute abnormality in the abdomen or pelvis. 2. High attenuation material layering within the gallbladder lumen consistent with sludge. No definite radiopaque cholelithiasis by CT imaging. 3. Moderate hepatic steatosis. 4. Colonic diverticular disease without CT evidence of active inflammation. 5. Diastases recti.   Electronically Signed   By: Jacqulynn Cadet M.D.   On: 08/15/2014 20:09   US Abdomen Limited  08/16/2014   CLINICAL DATA:  One month history of right upper quadrant region pain  EXAM: US ABDOMEN LIMITED -  RIGHT UPPER QUADRANT  COMPARISON:  CT abdomen and pelvis August 15, 2014  FINDINGS: Gallbladder:  There is a mild degree of sludge in the gallbladder. No gallstones or wall thickening visualized. No pericholecystic fluid. No sonographic Murphy sign noted.  Common bile duct:  Diameter: 4 mm. No intrahepatic or extrahepatic biliary duct dilatation.  Liver:  No focal lesion identified. Liver echogenicity is diffusely increased.  IMPRESSION: Small amount of sludge seen in gallbladder. No gallstones appreciable.  Increased liver echogenicity is consistent with hepatic steatosis. While no focal liver lesions are identified, it must be cautioned that the sensitivity of ultrasound for focal liver lesions is diminished in this circumstance.   Electronically Signed   By: Lowella Grip III M.D.   On: 08/16/2014 10:38    Scheduled Meds: . pantoprazole (PROTONIX) IV  40 mg Intravenous Q12H  . sodium chloride  3 mL Intravenous Q12H   Continuous Infusions:    Time Spent: 25 min   [name]  [facility] Pager [phone]. If 7PM-7AM, please contact night-coverage at [email or web address],  password [facility] 08/17/2014, 8:37 AM  LOS: 2 days

## 2014-08-17 NOTE — Care Management Note (Signed)
    Page 1 of 1   08/17/2014     2:17:02 PM CARE MANAGEMENT NOTE 08/17/2014  Patient:  Amy [name],Amy [name]   Account Number:  [id number]  Date Initiated:  08/17/2014  Documentation initiated by:  CHILDRESS,JESSICA  Subjective/Objective Assessment:   Pt is from home, lives with spouse and children. Pt is independent at baseline. Pt is uninsured but has PCP. Pt is employeed. Pt has no DME's or HH needs. Pt discharging home today.     Action/Plan:   Pt referred to financial counselor. Pt given MATCH voucher for meds prescribed. Pt has no further CM needs.   Anticipated DC Date:  08/17/2014   Anticipated DC Plan:  HOME/SELF CARE  In-house referral  Financial Counselor      DC Planning Services  CM consult  [employer]      Choice offered to / List presented to:             Status of service:  Completed, signed off Medicare Important Message given?   (If response is "NO", the following Medicare IM given date fields will be blank) Date Medicare IM given:   Medicare IM given by:   Date Additional Medicare IM given:   Additional Medicare IM given by:    Discharge Disposition:  HOME/SELF CARE  Per UR Regulation:    If discussed at Long Length of Stay Meetings, dates discussed:    Comments:  08/17/2014 [place], RN, MSN, CM

## 2014-08-17 NOTE — Discharge Summary (Addendum)
Physician Discharge Summary  Amy [name] WIO:[id number] DOB: 14-Nov-1975 DOA: 08/15/2014  PCP: Collene Mares, PA-C  Admit date: 08/15/2014 Discharge date: 08/17/2014  Time spent: 30 minutes  Recommendations for Outpatient Follow-up:  1. Primary care doctor in 2-4 weeks.    Discharge Diagnoses:  Active Problems:   Abdominal pain, chronic, right lower quadrant   Discharge Condition: stable  Diet recommendation: regular  Filed Weights   08/15/14 2226 08/16/14 0446 08/17/14 0630  Weight: 197.405 kg (435 lb 3.2 oz) 197.451 kg (435 lb 4.8 oz) 196.68 kg (433 lb 9.6 oz)    History of present illness:  39 yo female with Amy [name] c/o n/v since 01/2014, and then apparently c/o right upper quadrant pain x 1 week. Sharp pain, associate with n/v. Food apparently triggers nausea and vomitting. + diarrhea x 21month. Pt denies fever, chills, brbpr, black stool, dysuria, hematuria. Pt was brought to ED for evaluation and found to have cholecystitis. ED consulted surgery who recommended cipro iv and also apparently u/s in am. They will be by to see the patient. Pt will be admitted for cholecystitis.  Hospital Course:  Abdominal pain: - CT scan showed no acute findings, allergies are within normal limits. - Abdominal ultrasound showed no acute cholecystitis, surgery was consulted they recommended no further intervention. - CT scan of the chest was negative for PE. - Her pain was repeated to swell by palpation, she was started on tramadol she will continue his outpatient. - Follow-up with PCP and to 4 weeks.  Leukocytosis:- - Improving.  GERD Continue Protonix  Procedures:  Echo   Ct abd and pelvis   CT chest  Consultations:  none  Discharge Exam: Filed Vitals:   08/17/14 0630  BP: 128/73  Pulse: 98  Temp: 99.3 F (37.4 C)  Resp: 20    General: A&O x3 Cardiovascular: RRR Respiratory: good air movement CTA B/L  Discharge Instructions    Current Discharge  Medication List    START taking these medications   Details  traMADol (ULTRAM) 50 MG tablet Take 2 tablets (100 mg total) by mouth every 12 (twelve) hours as needed for moderate pain. Qty: 30 tablet, Refills: 0      CONTINUE these medications which have NOT CHANGED   Details  etonogestrel (IMPLANON) 68 MG IMPL implant Inject 1 each into the skin once.    azithromycin (ZITHROMAX Z-PAK) 250 MG tablet As directed Qty: 6 each, Refills: 0    methocarbamol (ROBAXIN-750) 750 MG tablet Take 1 tablet (750 mg total) by mouth 4 (four) times daily. Qty: 30 tablet, Refills: 0    omeprazole (PRILOSEC) 20 MG capsule Take 1 capsule (20 mg total) by mouth daily. Qty: 30 capsule, Refills: 11      STOP taking these medications     promethazine-codeine (PHENERGAN WITH CODEINE) 6.25-10 MG/5ML syrup        Allergies  Allergen Reactions  . Keflex [Cephalexin] Itching and Rash      The results of significant diagnostics from this hospitalization (including imaging, microbiology, ancillary and laboratory) are listed below for reference.    Significant Diagnostic Studies: Dg Chest 2 View  08/15/2014   CLINICAL DATA:  Nausea and vomiting with right upper quadrant pain for 1 week.  EXAM: CHEST  2 VIEW  COMPARISON:  08/15/2014  FINDINGS: The heart size and mediastinal contours are within normal limits. Both lungs are clear. The visualized skeletal structures are unremarkable.  IMPRESSION: No active cardiopulmonary disease.   Electronically Signed   By: Jerilynn Mages.  Shick M.D.   On: 08/15/2014 22:45   Ct Angio Chest Pe W/cm &/or Wo Cm  08/17/2014   CLINICAL DATA:  Amy [name] c/o n/v since 01/2014, and then apparently c/o right upper quadrant pain x 1 week, Pt was brought to ED for evaluation and found to have cholecystitis. Ex-smoker  EXAM: CT ANGIOGRAPHY CHEST WITH CONTRAST  TECHNIQUE: Multidetector CT imaging of the chest was performed using the standard protocol during bolus administration of intravenous contrast.  Multiplanar CT image reconstructions and MIPs were obtained to evaluate the vascular anatomy.  CONTRAST:  170mL OMNIPAQUE IOHEXOL 350 MG/ML SOLN  COMPARISON:  04/02/2014  FINDINGS: Left arm contrast injection. The SVC is patent. Unremarkable cardiac chambers. Satisfactory opacification of pulmonary arteries noted, and there is no evidence of pulmonary emboli. Some image degradation secondary to body habitus and breathing during the acquisition. Patent superior and inferior pulmonary veins bilaterally. Adequate contrast opacification of the thoracic aorta with no evidence of dissection, aneurysm, or stenosis. There is classic 3-vessel brachiocephalic arch anatomy without proximal stenosis. No significant atheromatous plaque.  No pleural or pericardial effusion. Subcentimeter prevascular lymph nodes. No hilar adenopathy. Mild dependent atelectasis posteriorly in both lower lobes. Lungs otherwise clear. Thoracic spine and sternum intact. Visualized portions of upper abdomen unremarkable.  Review of the MIP images confirms the above findings.  IMPRESSION: 1. Negative for acute PE or thoracic aortic dissection.   Electronically Signed   By: Lucrezia Europe M.D.   On: 08/17/2014 11:25   Ct Abdomen Pelvis W Contrast  08/15/2014   CLINICAL DATA:  39 year old female with right upper quadrant pain progressive over the past 2 weeks.  EXAM: CT ABDOMEN AND PELVIS WITH CONTRAST  TECHNIQUE: Multidetector CT imaging of the abdomen and pelvis was performed using the standard protocol following bolus administration of intravenous contrast.  CONTRAST:  100 mL Omnipaque 300  COMPARISON:  Prior CT abdomen/ pelvis 03/22/2011  FINDINGS: Lower Chest: The lung bases are clear. Visualized cardiac structures are within normal limits for size. No pericardial effusion. Unremarkable visualized distal thoracic esophagus.  Abdomen: Unremarkable CT appearance of the stomach, duodenum, spleen, adrenal glands and pancreas. Normal hepatic contour and  morphology. Diffuse low attenuation of the hepatic parenchyma consistent with fatty infiltration. Nonspecific 1.5 cm low-attenuation lesion in the inferior aspect of hepatic segment 2. This structure is water attenuation most consistent with a simple cyst. High attenuation material layers within the gallbladder lumen. No radiopaque stones. No gallbladder wall thickening or pericholecystic fluid. Negative for biliary duct dilatation.  Unremarkable appearance of the bilateral kidneys. No focal solid lesion, hydronephrosis or nephrolithiasis. Colonic diverticular disease without CT evidence of active inflammation. No evidence of obstruction or focal bowel wall thickening. Normal appendix in the right lower quadrant. The terminal ileum is unremarkable. No free fluid or suspicious adenopathy.  Pelvis: Unremarkable appearance of the bladder, uterus and adnexa. No free fluid or suspicious adenopathy.  Bones/Soft Tissues: Diastases recti. Mild edema and skin thickening in the pannus. No acute fracture or aggressive appearing lytic or blastic osseous lesion.  Vascular: No significant atherosclerotic vascular disease, aneurysmal dilatation or acute abnormality.  IMPRESSION: 1. No acute abnormality in the abdomen or pelvis. 2. High attenuation material layering within the gallbladder lumen consistent with sludge. No definite radiopaque cholelithiasis by CT imaging. 3. Moderate hepatic steatosis. 4. Colonic diverticular disease without CT evidence of active inflammation. 5. Diastases recti.   Electronically Signed   By: Jacqulynn Cadet M.D.   On: 08/15/2014 20:09   US Abdomen Limited  08/16/2014   CLINICAL DATA:  One month history of right upper quadrant region pain  EXAM: US ABDOMEN LIMITED - RIGHT UPPER QUADRANT  COMPARISON:  CT abdomen and pelvis August 15, 2014  FINDINGS: Gallbladder:  There is a mild degree of sludge in the gallbladder. No gallstones or wall thickening visualized. No pericholecystic fluid. No  sonographic Murphy sign noted.  Common bile duct:  Diameter: 4 mm. No intrahepatic or extrahepatic biliary duct dilatation.  Liver:  No focal lesion identified. Liver echogenicity is diffusely increased.  IMPRESSION: Small amount of sludge seen in gallbladder. No gallstones appreciable.  Increased liver echogenicity is consistent with hepatic steatosis. While no focal liver lesions are identified, it must be cautioned that the sensitivity of ultrasound for focal liver lesions is diminished in this circumstance.   Electronically Signed   By: Lowella Grip III M.D.   On: 08/16/2014 10:38    Microbiology: No results found for this or any previous visit (from the past 240 hour(s)).   Labs: Basic Metabolic Panel:  Recent Labs Lab 08/15/14 1445 08/16/14 0517  NA 142 139  K 4.1 4.2  CL 104 103  CO2 28 29  GLUCOSE 89 85  BUN 8 7  CREATININE 0.89 0.88  CALCIUM 8.6 8.4   Liver Function Tests:  Recent Labs Lab 08/15/14 1445 08/16/14 0517  AST 22 18  ALT 23 20  ALKPHOS 50 52  BILITOT 0.7 0.6  PROT 7.1 7.0  ALBUMIN 3.4* 3.2*    Recent Labs Lab 08/15/14 1445  LIPASE 20   No results for input(s): AMMONIA in the last 168 hours. CBC:  Recent Labs Lab 08/15/14 1445 08/16/14 0517  WBC 13.8* 12.8*  NEUTROABS 9.9* 10.2*  HGB 14.8 13.4  HCT 44.7 42.1  MCV 92.2 92.9  PLT 330 308   Cardiac Enzymes: No results for input(s): CKTOTAL, CKMB, CKMBINDEX, TROPONINI in the last 168 hours. BNP: BNP (last 3 results) No results for input(s): BNP in the last 8760 hours.  ProBNP (last 3 results)  Recent Labs  04/02/14 2014  PROBNP 71.8    CBG: No results for input(s): GLUCAP in the last 168 hours.   Signed:  [name]  [facility] 08/17/2014, 1:43 PM

## 2014-08-17 NOTE — Progress Notes (Signed)
Discharge instruction reviewed with patient. No distress noted. IV removed.

## 2014-08-21 NOTE — Care Management Utilization Note (Signed)
UR Completed.  

## 2015-03-06 ENCOUNTER — Emergency Department (HOSPITAL_COMMUNITY)
Admission: EM | Admit: 2015-03-06 | Discharge: 2015-03-06 | Disposition: A | Discharge status: Home / Self Care | Payer: PRIVATE HEALTH INSURANCE | Attending: Emergency Medicine | Admitting: Emergency Medicine

## 2015-03-06 ENCOUNTER — Encounter (HOSPITAL_COMMUNITY): Payer: Self-pay | Admitting: Emergency Medicine

## 2015-03-06 ENCOUNTER — Emergency Department (HOSPITAL_COMMUNITY): Payer: PRIVATE HEALTH INSURANCE

## 2015-03-06 DIAGNOSIS — Z8719 Personal history of other diseases of the digestive system: Secondary | ICD-10-CM | POA: Diagnosis not present

## 2015-03-06 DIAGNOSIS — K5732 Diverticulitis of large intestine without perforation or abscess without bleeding: Secondary | ICD-10-CM | POA: Diagnosis not present

## 2015-03-06 DIAGNOSIS — E669 Obesity, unspecified: Secondary | ICD-10-CM | POA: Insufficient documentation

## 2015-03-06 DIAGNOSIS — Z8619 Personal history of other infectious and parasitic diseases: Secondary | ICD-10-CM | POA: Insufficient documentation

## 2015-03-06 DIAGNOSIS — Z3202 Encounter for pregnancy test, result negative: Secondary | ICD-10-CM | POA: Diagnosis not present

## 2015-03-06 DIAGNOSIS — F17210 Nicotine dependence, cigarettes, uncomplicated: Secondary | ICD-10-CM | POA: Diagnosis not present

## 2015-03-06 DIAGNOSIS — R112 Nausea with vomiting, unspecified: Secondary | ICD-10-CM | POA: Diagnosis present

## 2015-03-06 LAB — LIPASE, BLOOD: Lipase: 16 U/L (ref 11–51)

## 2015-03-06 LAB — URINALYSIS, ROUTINE W REFLEX MICROSCOPIC
Bilirubin Urine: NEGATIVE
Glucose, UA: NEGATIVE mg/dL
HGB URINE DIPSTICK: NEGATIVE
Ketones, ur: NEGATIVE mg/dL
Leukocytes, UA: NEGATIVE
Nitrite: NEGATIVE
PH: 6.5 (ref 5.0–8.0)
Protein, ur: NEGATIVE mg/dL
SPECIFIC GRAVITY, URINE: 1.015 (ref 1.005–1.030)

## 2015-03-06 LAB — BASIC METABOLIC PANEL
Anion gap: 7 (ref 5–15)
BUN: 7 mg/dL (ref 6–20)
CALCIUM: 8.6 mg/dL — AB (ref 8.9–10.3)
CO2: 28 mmol/L (ref 22–32)
CREATININE: 0.86 mg/dL (ref 0.44–1.00)
Chloride: 104 mmol/L (ref 101–111)
GFR calc Af Amer: >60 mL/min (ref >60)
GLUCOSE: 101 mg/dL — AB (ref 65–99)
Potassium: 3.8 mmol/L (ref 3.5–5.1)
Sodium: 139 mmol/L (ref 135–145)

## 2015-03-06 LAB — CBC WITH DIFFERENTIAL/PLATELET
Basophils Absolute: 0 10*3/uL (ref 0.0–0.1)
Basophils Relative: 0 %
EOS ABS: 0.1 10*3/uL (ref 0.0–0.7)
EOS PCT: 1 %
HCT: 43.2 % (ref 36.0–46.0)
Hemoglobin: 14.4 g/dL (ref 12.0–15.0)
LYMPHS PCT: 17 %
Lymphs Abs: 2.2 10*3/uL (ref 0.7–4.0)
MCH: 31 pg (ref 26.0–34.0)
MCHC: 33.3 g/dL (ref 30.0–36.0)
MCV: 93.1 fL (ref 78.0–100.0)
MONOS PCT: 3 %
Monocytes Absolute: 0.4 10*3/uL (ref 0.1–1.0)
Neutro Abs: 10.5 10*3/uL — ABNORMAL HIGH (ref 1.7–7.7)
Neutrophils Relative %: 79 %
PLATELETS: 293 10*3/uL (ref 150–400)
RBC: 4.64 MIL/uL (ref 3.87–5.11)
RDW: 15.1 % (ref 11.5–15.5)
WBC: 13.3 10*3/uL — ABNORMAL HIGH (ref 4.0–10.5)

## 2015-03-06 LAB — HEPATIC FUNCTION PANEL
ALK PHOS: 52 U/L (ref 38–126)
ALT: 15 U/L (ref 14–54)
AST: 19 U/L (ref 15–41)
Albumin: 3.3 g/dL — ABNORMAL LOW (ref 3.5–5.0)
BILIRUBIN INDIRECT: 0.3 mg/dL (ref 0.3–0.9)
BILIRUBIN TOTAL: 0.5 mg/dL (ref 0.3–1.2)
Bilirubin, Direct: 0.2 mg/dL (ref 0.1–0.5)
Total Protein: 6.7 g/dL (ref 6.5–8.1)

## 2015-03-06 LAB — POC URINE PREG, ED: Preg Test, Ur: NEGATIVE

## 2015-03-06 LAB — AMYLASE: Amylase: 27 U/L — ABNORMAL LOW (ref 28–100)

## 2015-03-06 MED ORDER — CIPROFLOXACIN HCL 500 MG PO TABS: 500.0000 mg | ORAL_TABLET | Freq: Two times a day (BID) | ORAL | Status: DC

## 2015-03-06 MED ORDER — FENTANYL CITRATE (PF) 100 MCG/2ML IJ SOLN
50.0000 ug | Freq: Once | INTRAMUSCULAR | Status: AC
  Administered 2015-03-06: 50 ug via INTRAVENOUS
  Filled 2015-03-06: qty 2

## 2015-03-06 MED ORDER — SODIUM CHLORIDE 0.9 % IV SOLN: INTRAVENOUS | Status: DC

## 2015-03-06 MED ORDER — METRONIDAZOLE IN NACL 5-0.79 MG/ML-% IV SOLN
500.0000 mg | Freq: Once | INTRAVENOUS | Status: AC
  Administered 2015-03-06: 500 mg via INTRAVENOUS
  Filled 2015-03-06: qty 100

## 2015-03-06 MED ORDER — ONDANSETRON HCL 4 MG/2ML IJ SOLN
4.0000 mg | Freq: Once | INTRAMUSCULAR | Status: AC
  Administered 2015-03-06: 4 mg via INTRAVENOUS
  Filled 2015-03-06: qty 2

## 2015-03-06 MED ORDER — IOHEXOL 300 MG/ML  SOLN
150.0000 mL | Freq: Once | INTRAMUSCULAR | Status: AC | PRN
  Administered 2015-03-06: 150 mL via INTRAVENOUS

## 2015-03-06 MED ORDER — PROMETHAZINE HCL 25 MG PO TABS: 25.0000 mg | ORAL_TABLET | Freq: Four times a day (QID) | ORAL | Status: DC | PRN

## 2015-03-06 MED ORDER — SODIUM CHLORIDE 0.9 % IV BOLUS (SEPSIS)
500.0000 mL | Freq: Once | INTRAVENOUS | Status: AC
  Administered 2015-03-06: 500 mL via INTRAVENOUS

## 2015-03-06 MED ORDER — FENTANYL CITRATE (PF) 100 MCG/2ML IJ SOLN
INTRAMUSCULAR | Status: AC
  Administered 2015-03-06: 50 ug
  Filled 2015-03-06: qty 2

## 2015-03-06 MED ORDER — METRONIDAZOLE 500 MG PO TABS: 500.0000 mg | ORAL_TABLET | Freq: Three times a day (TID) | ORAL | Status: DC

## 2015-03-06 MED ORDER — HYDROMORPHONE HCL 1 MG/ML IJ SOLN
1.0000 mg | Freq: Once | INTRAMUSCULAR | Status: AC
Start: 2015-03-06 — End: 2015-03-06
  Administered 2015-03-06: 1 mg via INTRAVENOUS
  Filled 2015-03-06: qty 1

## 2015-03-06 MED ORDER — CIPROFLOXACIN IN D5W 400 MG/200ML IV SOLN
400.0000 mg | Freq: Once | INTRAVENOUS | Status: AC
Start: 2015-03-06 — End: 2015-03-06
  Administered 2015-03-06: 400 mg via INTRAVENOUS
  Filled 2015-03-06: qty 200

## 2015-03-06 MED ORDER — HYDROCODONE-ACETAMINOPHEN 5-325 MG PO TABS: 1.0000 | ORAL_TABLET | Freq: Four times a day (QID) | ORAL | Status: DC | PRN

## 2015-03-06 NOTE — Discharge Instructions (Signed)
Diverticulitis Diverticulitis is when small pockets that have formed in your colon (large intestine) become infected or swollen. HOME CARE  Follow your doctor's instructions.  Follow a special diet if told by your doctor.  When you feel better, your doctor may tell you to change your diet. You may be told to eat a lot of fiber. Fruits and vegetables are good sources of fiber. Fiber makes it easier to poop (have bowel movements).  Take supplements or probiotics as told by your doctor.  Only take medicines as told by your doctor.  Keep all follow-up visits with your doctor. GET HELP IF:  Your pain does not get better.  You have a hard time eating food.  You are not pooping like normal. GET HELP RIGHT AWAY IF:  Your pain gets worse.  Your problems do not get better.  Your problems suddenly get worse.  You have a fever.  You keep throwing up (vomiting).  You have bloody or black, tarry poop (stool). MAKE SURE YOU:   Understand these instructions.  Will watch your condition.  Will get help right away if you are not doing well or get worse.   This information is not intended to replace advice given to you by your health care provider. Make sure you discuss any questions you have with your health care provider.  Take antibiotics as directed. He should be improved some over the next 2 days. If not improving by 2 days important that your seen again may require admission. Return for any new or worse symptoms. Take the pain medicine as needed. Make an appointment to follow-up with your regular doctor.   Document Released: 09/24/2007 Document Revised: 04/12/2013 Document Reviewed: 03/02/2013 Elsevier Interactive Patient Education [employer].

## 2015-03-06 NOTE — ED Notes (Signed)
Pt states that last night she was feeling nauseated with abdominal pain feeling like she needs to have diarrhea but cannot.  Pain continues today with no relief.

## 2015-03-06 NOTE — ED Provider Notes (Signed)
CSN: [id number]     Arrival date & time 03/06/15  1334 History   First MD Initiated Contact with Patient 03/06/15 1513     Chief Complaint  Patient presents with  . Abdominal Pain     (Consider location/radiation/quality/duration/timing/severity/associated sxs/prior Treatment) Patient is a 39 y.o. female presenting with abdominal pain. The history is provided by the patient.  Abdominal Pain Associated symptoms: nausea and vomiting   Associated symptoms: no chest pain, no diarrhea, no dysuria, no fever and no shortness of breath    patient with acute onset of lower abdominal pain last evening around 10:00. Left side greater than right. Associated with nausea and vomiting 3. No diarrhea. Patient states pain is constant sharp ache 10 out of 10. Nonradiating to the back. Never had anything like this before not made better or worse by anything.  Past Medical History  Diagnosis Date  . Hx of chlamydia infection   . HSV-2 (herpes simplex virus 2) infection   . Obesity   . Pain in right hip 08/09/2012  . Reflux 01/19/2014  . Nexplanon insertion 02/07/2014    Inserted left arm 02/07/14  . Nexplanon removal 02/07/2014  . Acid reflux    Past Surgical History  Procedure Laterality Date  . Cesarean section    . Colonoscopy N/A 09/01/2013    Procedure: COLONOSCOPY;  Surgeon: Rogene Houston, MD;  Location: [facility];  Service: Endoscopy;  Laterality: N/A;  100   Family History  Problem Relation Age of Onset  . Hypertension Mother   . Diabetes Mother   . COPD Mother   . Hypertension Sister   . Cancer Maternal Grandfather   . Cancer Father    Social History  Substance Use Topics  . Smoking status: Current Some Day Smoker -- 0.50 packs/day    Types: Cigarettes  . Smokeless tobacco: Never Used     Comment: on the weekends when she drinks  . Alcohol Use: Yes     Comment: on weekends   OB History    Gravida Para Term Preterm AB TAB SAB Ectopic Multiple Living   1 1 1       1       Review of Systems  Constitutional: Negative for fever.  HENT: Negative for congestion.   Eyes: Negative for redness.  Respiratory: Negative for shortness of breath.   Cardiovascular: Negative for chest pain.  Gastrointestinal: Positive for nausea, vomiting and abdominal pain. Negative for diarrhea.  Genitourinary: Negative for dysuria.  Musculoskeletal: Negative for back pain.  Skin: Negative for rash.  Neurological: Negative for headaches.  Hematological: Does not bruise/bleed easily.  Psychiatric/Behavioral: Negative for confusion.      Allergies  Keflex  Home Medications   Prior to Admission medications   Medication Sig Start Date End Date Taking? Authorizing Provider  Aspirin-Salicylamide-Caffeine (BC HEADACHE) 325-95-16 MG TABS Take 1 packet by mouth daily as needed (for pain).   Yes Historical Provider, MD  etonogestrel (IMPLANON) 68 MG IMPL implant Inject 1 each into the skin once.   Yes Historical Provider, MD   BP 136/99 mmHg  Pulse 83  Temp(Src) 97.7 F (36.5 C) (Oral)  Resp 18  Ht 5\' 4"  (1.626 m)  Wt 409 lb (185.521 kg)  BMI 70.17 kg/m2  SpO2 93% Physical Exam  Constitutional: She is oriented to person, place, and time. She appears well-developed and well-nourished. No distress.  HENT:  Head: Normocephalic and atraumatic.  Mouth/Throat: Oropharynx is clear and moist.  Eyes: Conjunctivae and EOM  are normal. Pupils are equal, round, and reactive to light.  Neck: Normal range of motion. Neck supple.  Cardiovascular: Normal rate, regular rhythm and normal heart sounds.   No murmur heard. Pulmonary/Chest: Effort normal and breath sounds normal.  Abdominal: Soft. Bowel sounds are normal. There is tenderness.  Tenderness to palpation to lower quadrants left greater than right.  Musculoskeletal: Normal range of motion.  Neurological: She is alert and oriented to person, place, and time. No cranial nerve deficit. She exhibits normal muscle tone. Coordination  normal.  Skin: Skin is warm. No rash noted.  Nursing note and vitals reviewed.   ED Course  Procedures (including critical care time) Labs Review Labs Reviewed  AMYLASE - Abnormal; Notable for the following:    Amylase 27 (*)    All other components within normal limits  CBC WITH DIFFERENTIAL/PLATELET - Abnormal; Notable for the following:    WBC 13.3 (*)    Neutro Abs 10.5 (*)    All other components within normal limits  BASIC METABOLIC PANEL - Abnormal; Notable for the following:    Glucose, Bld 101 (*)    Calcium 8.6 (*)    All other components within normal limits  HEPATIC FUNCTION PANEL - Abnormal; Notable for the following:    Albumin 3.3 (*)    All other components within normal limits  LIPASE, BLOOD  URINALYSIS, ROUTINE W REFLEX MICROSCOPIC (NOT AT [facility])  POC URINE PREG, ED   Results for orders placed or performed during the hospital encounter of 03/06/15  Amylase  Result Value Ref Range   Amylase 27 (L) 28 - 100 U/L  CBC WITH DIFFERENTIAL  Result Value Ref Range   WBC 13.3 (H) 4.0 - 10.5 K/uL   RBC 4.64 3.87 - 5.11 MIL/uL   Hemoglobin 14.4 12.0 - 15.0 g/dL   HCT 43.2 36.0 - 46.0 %   MCV 93.1 78.0 - 100.0 fL   MCH 31.0 26.0 - 34.0 pg   MCHC 33.3 30.0 - 36.0 g/dL   RDW 15.1 11.5 - 15.5 %   Platelets 293 150 - 400 K/uL   Neutrophils Relative % 79 %   Neutro Abs 10.5 (H) 1.7 - 7.7 K/uL   Lymphocytes Relative 17 %   Lymphs Abs 2.2 0.7 - 4.0 K/uL   Monocytes Relative 3 %   Monocytes Absolute 0.4 0.1 - 1.0 K/uL   Eosinophils Relative 1 %   Eosinophils Absolute 0.1 0.0 - 0.7 K/uL   Basophils Relative 0 %   Basophils Absolute 0.0 0.0 - 0.1 K/uL  Basic metabolic panel  Result Value Ref Range   Sodium 139 135 - 145 mmol/L   Potassium 3.8 3.5 - 5.1 mmol/L   Chloride 104 101 - 111 mmol/L   CO2 28 22 - 32 mmol/L   Glucose, Bld 101 (H) 65 - 99 mg/dL   BUN 7 6 - 20 mg/dL   Creatinine, Ser 0.86 0.44 - 1.00 mg/dL   Calcium 8.6 (L) 8.9 - 10.3 mg/dL   GFR calc non  Af Amer >60 >60 mL/min   GFR calc Af Amer >60 >60 mL/min   Anion gap 7 5 - 15  Lipase, blood  Result Value Ref Range   Lipase 16 11 - 51 U/L  Urinalysis, Routine w reflex microscopic (not at [facility])  Result Value Ref Range   Color, Urine YELLOW YELLOW   APPearance CLEAR CLEAR   Specific Gravity, Urine 1.015 1.005 - 1.030   pH 6.5 5.0 - 8.0  Glucose, UA NEGATIVE NEGATIVE mg/dL   Hgb urine dipstick NEGATIVE NEGATIVE   Bilirubin Urine NEGATIVE NEGATIVE   Ketones, ur NEGATIVE NEGATIVE mg/dL   Protein, ur NEGATIVE NEGATIVE mg/dL   Nitrite NEGATIVE NEGATIVE   Leukocytes, UA NEGATIVE NEGATIVE  Hepatic function panel  Result Value Ref Range   Total Protein 6.7 6.5 - 8.1 g/dL   Albumin 3.3 (L) 3.5 - 5.0 g/dL   AST 19 15 - 41 U/L   ALT 15 14 - 54 U/L   Alkaline Phosphatase 52 38 - 126 U/L   Total Bilirubin 0.5 0.3 - 1.2 mg/dL   Bilirubin, Direct 0.2 0.1 - 0.5 mg/dL   Indirect Bilirubin 0.3 0.3 - 0.9 mg/dL  POC Urine Preg, ED (not at [facility])  Result Value Ref Range   Preg Test, Ur NEGATIVE NEGATIVE     Imaging Review Ct Abdomen Pelvis W Contrast  03/06/2015  CLINICAL DATA:  Left lower quadrant abdominal pain starting last night with nausea and cramping. EXAM: CT ABDOMEN AND PELVIS WITH CONTRAST TECHNIQUE: Multidetector CT imaging of the abdomen and pelvis was performed using the standard protocol following bolus administration of intravenous contrast. CONTRAST:  169mL OMNIPAQUE IOHEXOL 300 MG/ML  SOLN COMPARISON:  Multiple exams, including 08/15/2014 FINDINGS: Lower chest:  Unremarkable Hepatobiliary: Hepatic steatosis. Pancreas: Unremarkable Spleen: Unremarkable Adrenals/Urinary Tract: Unremarkable Stomach/Bowel: Acute diverticulitis at the junction of the descending and sigmoid colon with stranding in the adjacent mesentery. No extraluminal gas or abscess. Findings are well shown on images 57 through 71 of series 4. Vascular/Lymphatic: Right external iliac node 0.9 cm in short axis, with a  fatty hilum. Similar size left external iliac lymph nodes, likely reactive. Minimal atherosclerosis. Portacaval lymph node borderline prominent at 1.2 cm short axis, image 33 series 2. Reproductive: Unremarkable Other: No supplemental non-categorized findings. Musculoskeletal: Degenerative facet arthropathy on the right at L5-S1. Small umbilical hernia contains adipose tissue. IMPRESSION: 1. Moderate acute diverticulitis of the junction of descending and sigmoid colon. No abscess or extraluminal gas identified. 2. Small reactive lymph nodes in the pelvis. 3. Hepatic steatosis. 4. Degenerative right facet arthropathy at L5-S1. Electronically Signed   By: Van Clines M.D.   On: 03/06/2015 18:13   I have personally reviewed and evaluated these images and lab results as part of my medical decision-making.   EKG Interpretation None      MDM   Final diagnoses:  Diverticulitis of large intestine without perforation or abscess without bleeding   Patient with lower abdominal pain left side greater than right CT scan shows evidence of diverticulitis without the abscess or obstruction. No complicating factors. Labs without significant abnormalities other than leukocytosis. Patient received IV Cipro and IV Flagyl here. Patient will be treated as an outpatient with by mouth Flagyl 3 times a day for 10 days and Cipro twice a day for 10 days. Patient will make a limited follow-up with her record Dr. Patient will return if not improving in 2 days.    Fredia Sorrow, MD 03/06/15 2012

## 2015-03-06 NOTE — ED Notes (Signed)
POC urine preg was negative.

## 2015-07-08 ENCOUNTER — Emergency Department (HOSPITAL_COMMUNITY): Payer: PRIVATE HEALTH INSURANCE

## 2015-07-08 ENCOUNTER — Encounter (HOSPITAL_COMMUNITY): Payer: Self-pay | Admitting: *Deleted

## 2015-07-08 ENCOUNTER — Emergency Department (HOSPITAL_COMMUNITY)
Admission: EM | Admit: 2015-07-08 | Discharge: 2015-07-08 | Disposition: A | Discharge status: Home / Self Care | Payer: PRIVATE HEALTH INSURANCE | Attending: Emergency Medicine | Admitting: Emergency Medicine

## 2015-07-08 DIAGNOSIS — Z79899 Other long term (current) drug therapy: Secondary | ICD-10-CM | POA: Insufficient documentation

## 2015-07-08 DIAGNOSIS — F17210 Nicotine dependence, cigarettes, uncomplicated: Secondary | ICD-10-CM | POA: Diagnosis not present

## 2015-07-08 DIAGNOSIS — R197 Diarrhea, unspecified: Secondary | ICD-10-CM | POA: Diagnosis not present

## 2015-07-08 DIAGNOSIS — E669 Obesity, unspecified: Secondary | ICD-10-CM | POA: Insufficient documentation

## 2015-07-08 DIAGNOSIS — J069 Acute upper respiratory infection, unspecified: Secondary | ICD-10-CM | POA: Insufficient documentation

## 2015-07-08 DIAGNOSIS — R05 Cough: Secondary | ICD-10-CM | POA: Diagnosis present

## 2015-07-08 MED ORDER — BENZONATATE 100 MG PO CAPS: 100.0000 mg | ORAL_CAPSULE | Freq: Three times a day (TID) | ORAL | Status: DC

## 2015-07-08 MED ORDER — PREDNISONE 10 MG PO TABS: 20.0000 mg | ORAL_TABLET | Freq: Every day | ORAL | Status: AC

## 2015-07-08 MED ORDER — ALBUTEROL SULFATE HFA 108 (90 BASE) MCG/ACT IN AERS: 2.0000 | INHALATION_SPRAY | Freq: Four times a day (QID) | RESPIRATORY_TRACT | Status: DC | PRN

## 2015-07-08 MED ORDER — ALBUTEROL SULFATE (2.5 MG/3ML) 0.083% IN NEBU
2.5000 mg | INHALATION_SOLUTION | Freq: Once | RESPIRATORY_TRACT | Status: AC
  Administered 2015-07-08: 2.5 mg via RESPIRATORY_TRACT
  Filled 2015-07-08: qty 3

## 2015-07-08 MED ORDER — CETIRIZINE HCL 10 MG PO CAPS: 1.0000 | ORAL_CAPSULE | Freq: Every day | ORAL | Status: DC

## 2015-07-08 MED ORDER — AZITHROMYCIN 250 MG PO TABS
250.0000 mg | ORAL_TABLET | Freq: Every day | ORAL | Status: DC
Start: 2015-07-11 — End: 2015-07-08

## 2015-07-08 NOTE — ED Notes (Signed)
Pt states she has had productive cough and nasal congestion since Wednesday. Pt has been taking OTC medication but has only gotten worse. NAD noted.

## 2015-07-08 NOTE — ED Notes (Signed)
Patient with no complaints at this time. Respirations even and unlabored. Skin warm/dry. Discharge instructions reviewed with patient at this time. Patient given opportunity to voice concerns/ask questions. Patient discharged at this time and left Emergency Department with steady gait.   

## 2015-07-08 NOTE — ED Provider Notes (Signed)
The patient is a morbidly obese 40 year old female, no history of reactive airway disease, she does smoke cigarettes. She presents to the hospital with ongoing coughing which is been going on for several weeks. On exam she has clear lung sounds, no wheezing, no rhonchi, no rales, speaks in full sentences, no distress, chest x-ray without any acute findings, the patient appears stable, medications as below, the patient was informed of the treatment plan and is in agreement.  Medical screening examination/treatment/procedure(s) were conducted as a shared visit with non-physician practitioner(s) and myself.  I personally evaluated the patient during the encounter.  Clinical Impression:   Final diagnoses:  URI (upper respiratory infection)     Meds given in ED:  Medications  albuterol (PROVENTIL) (2.5 MG/3ML) 0.083% nebulizer solution 2.5 mg (2.5 mg Nebulization Given 07/08/15 1053)    Discharge Medication List as of 07/08/2015 12:14 PM    START taking these medications   Details  benzonatate (TESSALON) 100 MG capsule Take 1 capsule (100 mg total) by mouth every 8 (eight) hours., Starting 07/08/2015, Until Discontinued, Print    Cetirizine HCl (ZYRTEC ALLERGY) 10 MG CAPS Take 1 capsule (10 mg total) by mouth daily., Starting 07/08/2015, Until Discontinued, Print    predniSONE (DELTASONE) 10 MG tablet Take 2 tablets (20 mg total) by mouth daily., Starting 07/08/2015, Until Fri 07/13/15, Print          Noemi Chapel, MD 07/09/15 [phone]

## 2015-07-08 NOTE — ED Provider Notes (Signed)
CSN: [id number]     Arrival date & time 07/08/15  [id number] History   First MD Initiated Contact with Patient 07/08/15 1006     Chief Complaint  Patient presents with  . Cough     (Consider location/radiation/quality/duration/timing/severity/associated sxs/prior Treatment) HPI Comments: Patient is a morbidly obese 40yo female who presents with cough, congestion, rhinorrhea, and shortness of breath that began Wednesday. Patient endorses clear mucus. Patient had one episode of diarrhea on Thursday. Patient has taken over-the-counter cold and cough medicine medicine as well as a decongestant. Patient reports its just getting worse since onset. Patient endorses HA and body aches. Normal appetite and fluid intake. 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 Denies chest pain, abdominal pain.  Patient is a 40 y.o. female presenting with cough. The history is provided by the patient. No language interpreter was used.  Cough Associated symptoms: headaches, rhinorrhea and shortness of breath   Associated symptoms: no chest pain, no chills, no ear pain, no eye discharge, no fever, no rash, no sore throat and no wheezing     Past Medical History  Diagnosis Date  . Hx of chlamydia infection   . HSV-2 (herpes simplex virus 2) infection   . Obesity   . Pain in right hip 08/09/2012  . Reflux 01/19/2014  . Nexplanon insertion 02/07/2014    Inserted left arm 02/07/14  . Nexplanon removal 02/07/2014  . Acid reflux    Past Surgical History  Procedure Laterality Date  . Cesarean section    . Colonoscopy N/A 09/01/2013    Procedure: COLONOSCOPY;  Surgeon: Rogene Houston, MD;  Location: [facility];  Service: Endoscopy;  Laterality: N/A;  100   Family History  Problem Relation Age of Onset  . Hypertension Mother   . Diabetes Mother   . COPD Mother   . Hypertension Sister   . Cancer Maternal Grandfather   . Cancer Father    Social History  Substance Use Topics  .  Smoking status: Current Some Day Smoker -- 0.50 packs/day    Types: Cigarettes  . Smokeless tobacco: Never Used     Comment: on the weekends when she drinks  . Alcohol Use: Yes     Comment: on weekends   OB History    Gravida Para Term Preterm AB TAB SAB Ectopic Multiple Living   1 1 1       1      Review of Systems  Constitutional: Negative for fever and chills.  HENT: Positive for rhinorrhea. Negative for ear pain, facial swelling and sore throat.   Eyes: Negative for discharge.  Respiratory: Positive for cough, chest tightness and shortness of breath. Negative for wheezing.   Cardiovascular: Negative for chest pain.  Gastrointestinal: Positive for diarrhea (1 episode on Thursday). Negative for nausea and abdominal pain.  Genitourinary: Negative for dysuria.  Musculoskeletal: Negative for back pain.  Skin: Negative for rash and wound.  Neurological: Positive for headaches.  Psychiatric/Behavioral: The patient is not nervous/anxious.       Allergies  Keflex  Home Medications   Prior to Admission medications   Medication Sig Start Date End Date Taking? Authorizing Provider  Aspirin-Salicylamide-Caffeine (BC HEADACHE) 325-95-16 MG TABS Take 1 packet by mouth daily as needed (for pain).   Yes Historical Provider, MD  aspirin-sod bicarb-citric acid (ALKA-SELTZER) 325 MG TBEF tablet Take 325 mg by mouth every 6 (six) hours as needed (cold).   Yes Historical Provider, MD  Chlorpheniramine-Phenylephrine (SINUS &  ALLERGY PO) Take by mouth.   Yes Historical Provider, MD  etonogestrel (IMPLANON) 68 MG IMPL implant Inject 1 each into the skin once.   Yes Historical Provider, MD  guaiFENesin-dextromethorphan (ROBITUSSIN DM) 100-10 MG/5ML syrup Take 15 mLs by mouth every 4 (four) hours as needed for cough.   Yes Historical Provider, MD  omeprazole (PRILOSEC OTC) 20 MG tablet Take 40 mg by mouth daily.   Yes Historical Provider, MD  ranitidine (ZANTAC) 150 MG tablet Take 150 mg by mouth  daily.   Yes Historical Provider, MD  albuterol (PROVENTIL HFA;VENTOLIN HFA) 108 (90 Base) MCG/ACT inhaler Inhale 2 puffs into the lungs every 6 (six) hours as needed for wheezing or shortness of breath. 07/08/15   Corrado Hymon M Kienan Doublin, PA-C  benzonatate (TESSALON) 100 MG capsule Take 1 capsule (100 mg total) by mouth every 8 (eight) hours. 07/08/15   Frederica Kuster, PA-C  Cetirizine HCl (ZYRTEC ALLERGY) 10 MG CAPS Take 1 capsule (10 mg total) by mouth daily. 07/08/15   Frederica Kuster, PA-C  predniSONE (DELTASONE) 10 MG tablet Take 2 tablets (20 mg total) by mouth daily. 07/08/15 07/13/15  Jesson Foskey M Deane Wattenbarger, PA-C   BP 142/95 mmHg  Pulse 95  Temp(Src) 98 F (36.7 C) (Oral)  Resp 16  Ht 5\' 4"  (1.626 m)  Wt 183.707 kg  BMI 69.48 kg/m2  SpO2 95% Physical Exam  Constitutional: She is oriented to person, place, and time. She appears well-developed and well-nourished. No distress.  HENT:  Head: Normocephalic and atraumatic.  Mouth/Throat: Oropharynx is clear and moist. No oropharyngeal exudate.  Eyes: Conjunctivae are normal. Right eye exhibits no discharge. Left eye exhibits no discharge. No scleral icterus.  Neck: Normal range of motion. Neck supple. No thyromegaly present.  Cardiovascular: Normal rate, regular rhythm and normal heart sounds.  Exam reveals no gallop and no friction rub.   No murmur heard. Pulmonary/Chest: Effort normal and breath sounds normal. No stridor. No respiratory distress. She has no wheezes. She has no rales.  Abdominal: Soft. Bowel sounds are normal. She exhibits no distension. There is no tenderness. There is no rebound and no guarding.  Musculoskeletal: She exhibits no edema.  Lymphadenopathy:    She has no cervical adenopathy.  Neurological: She is alert and oriented to person, place, and time. Coordination normal.  Skin: Skin is warm and dry. No rash noted. She is not diaphoretic. No pallor.  Psychiatric: She has a normal mood and affect. Her behavior is normal.  Judgment and thought content normal.  Nursing note and vitals reviewed.   ED Course  Procedures (including critical care time) Labs Review Labs Reviewed - No data to display  Imaging Review Dg Chest 2 View  07/08/2015  CLINICAL DATA:  Shortness of breath and cough for 4 days EXAM: CHEST  2 VIEW COMPARISON:  Chest radiograph August 15, 2014 and chest CT August 17, 2014 FINDINGS: There is no edema or consolidation. The heart size and pulmonary vascularity are normal. No adenopathy. No bone lesions. There is extensive superior mediastinal fat, a finding also present on prior chest CT examination. IMPRESSION: No edema or consolidation. Electronically Signed   By: Lowella Grip III M.D.   On: 07/08/2015 11:17   I have personally reviewed and evaluated these images and lab results as part of my medical decision-making.   EKG Interpretation None      MDM   Pt symptoms consistent with viral URI. CXR negative for acute infiltrate or edema. Pt will be discharged  with symptomatic treatment, including prednisone, albuterol MDI, Zyrctec, and Tessalon.  Discussed return precautions.  Pt is hemodynamically stable & in NAD prior to discharge.   Final diagnoses:  URI (upper respiratory infection)      Frederica Kuster, PA-C 07/08/15 1227  Noemi Chapel, MD 07/09/15 [phone]

## 2015-07-08 NOTE — Discharge Instructions (Signed)
Medications: Tessalon, Zyrtec, Prednisone  Treatment: Take Tessalon every 8 hours as needed for cough. Take Zyrtec daily to help dry up your mucous membranes. Take Albuterol inhaler as needed for shortness of breath. Take Prednisone for 5 days to clear your chest. Take 2 tablets on the first day, and 1 tablet on days 2-5.  Follow-up: Please follow up with Dr. Collene Mares if your symptoms are not improving within 5-7 days. Please return to the emergency department if you develop a fever, worsening shortness of breath, coughing up blood, or any other new or concerning symptom.  Upper Respiratory Infection, Adult Most upper respiratory infections (URIs) are a viral infection of the air passages leading to the lungs. A URI affects the nose, throat, and upper air passages. The most common type of URI is nasopharyngitis and is typically referred to as "the common cold." URIs run their course and usually go away on their own. Most of the time, a URI does not require medical attention, but sometimes a bacterial infection in the upper airways can follow a viral infection. This is called a secondary infection. Sinus and middle ear infections are common types of secondary upper respiratory infections. Bacterial pneumonia can also complicate a URI. A URI can worsen asthma and chronic obstructive pulmonary disease (COPD). Sometimes, these complications can require emergency medical care and may be life threatening.  CAUSES Almost all URIs are caused by viruses. A virus is a type of germ and can spread from one person to another.  RISKS FACTORS You may be at risk for a URI if:   You smoke.   You have chronic heart or lung disease.  You have a weakened defense (immune) system.   You are very young or very old.   You have nasal allergies or asthma.  You work in crowded or poorly ventilated areas.  You work in health care facilities or schools. SIGNS AND SYMPTOMS  Symptoms typically develop 2-3 days after  you come in contact with a cold virus. Most viral URIs last 7-10 days. However, viral URIs from the influenza virus (flu virus) can last 14-18 days and are typically more severe. Symptoms may include:   Runny or stuffy (congested) nose.   Sneezing.   Cough.   Sore throat.   Headache.   Fatigue.   Fever.   Loss of appetite.   Pain in your forehead, behind your eyes, and over your cheekbones (sinus pain).  Muscle aches.  DIAGNOSIS  Your health care provider may diagnose a URI by:  Physical exam.  Tests to check that your symptoms are not due to another condition such as:  Strep throat.  Sinusitis.  Pneumonia.  Asthma. TREATMENT  A URI goes away on its own with time. It cannot be cured with medicines, but medicines may be prescribed or recommended to relieve symptoms. Medicines may help:  Reduce your fever.  Reduce your cough.  Relieve nasal congestion. HOME CARE INSTRUCTIONS   Take medicines only as directed by your health care provider.   Gargle warm saltwater or take cough drops to comfort your throat as directed by your health care provider.  Use a warm mist humidifier or inhale steam from a shower to increase air moisture. This may make it easier to breathe.  Drink enough fluid to keep your urine clear or pale yellow.   Eat soups and other clear broths and maintain good nutrition.   Rest as needed.   Return to work when your temperature has returned to normal  or as your health care provider advises. You may need to stay home longer to avoid infecting others. You can also use a face mask and careful hand washing to prevent spread of the virus.  Increase the usage of your inhaler if you have asthma.   Do not use any tobacco products, including cigarettes, chewing tobacco, or electronic cigarettes. If you need help quitting, ask your health care provider. PREVENTION  The best way to protect yourself from getting a cold is to practice good  hygiene.   Avoid oral or hand contact with people with cold symptoms.   Wash your hands often if contact occurs.  There is no clear evidence that vitamin C, vitamin E, echinacea, or exercise reduces the chance of developing a cold. However, it is always recommended to get plenty of rest, exercise, and practice good nutrition.  SEEK MEDICAL CARE IF:   You are getting worse rather than better.   Your symptoms are not controlled by medicine.   You have chills.  You have worsening shortness of breath.  You have brown or red mucus.  You have yellow or brown nasal discharge.  You have pain in your face, especially when you bend forward.  You have a fever.  You have swollen neck glands.  You have pain while swallowing.  You have white areas in the back of your throat. SEEK IMMEDIATE MEDICAL CARE IF:   You have severe or persistent:  Headache.  Ear pain.  Sinus pain.  Chest pain.  You have chronic lung disease and any of the following:  Wheezing.  Prolonged cough.  Coughing up blood.  A change in your usual mucus.  You have a stiff neck.  You have changes in your:  Vision.  Hearing.  Thinking.  Mood. MAKE SURE YOU:   Understand these instructions.  Will watch your condition.  Will get help right away if you are not doing well or get worse.   This information is not intended to replace advice given to you by your health care provider. Make sure you discuss any questions you have with your health care provider.   Document Released: 10/01/2000 Document Revised: 08/22/2014 Document Reviewed: 07/13/2013 Elsevier Interactive Patient Education [employer].

## 2015-07-15 ENCOUNTER — Encounter (HOSPITAL_COMMUNITY): Payer: Self-pay | Admitting: *Deleted

## 2015-07-15 ENCOUNTER — Emergency Department (HOSPITAL_COMMUNITY)
Admission: EM | Admit: 2015-07-15 | Discharge: 2015-07-15 | Disposition: A | Discharge status: Home / Self Care | Payer: PRIVATE HEALTH INSURANCE | Source: Home / Self Care | Attending: Emergency Medicine | Admitting: Emergency Medicine

## 2015-07-15 ENCOUNTER — Emergency Department (HOSPITAL_COMMUNITY)
Admission: EM | Admit: 2015-07-15 | Discharge: 2015-07-15 | Disposition: A | Discharge status: Home / Self Care | Payer: PRIVATE HEALTH INSURANCE | Attending: Emergency Medicine | Admitting: Emergency Medicine

## 2015-07-15 DIAGNOSIS — E669 Obesity, unspecified: Secondary | ICD-10-CM | POA: Insufficient documentation

## 2015-07-15 DIAGNOSIS — Z79899 Other long term (current) drug therapy: Secondary | ICD-10-CM | POA: Insufficient documentation

## 2015-07-15 DIAGNOSIS — R197 Diarrhea, unspecified: Secondary | ICD-10-CM

## 2015-07-15 DIAGNOSIS — Z5321 Procedure and treatment not carried out due to patient leaving prior to being seen by health care provider: Secondary | ICD-10-CM

## 2015-07-15 DIAGNOSIS — F17210 Nicotine dependence, cigarettes, uncomplicated: Secondary | ICD-10-CM | POA: Insufficient documentation

## 2015-07-15 LAB — CBC WITH DIFFERENTIAL/PLATELET
BASOS PCT: 0 %
Basophils Absolute: 0 10*3/uL (ref 0.0–0.1)
EOS ABS: 0.1 10*3/uL (ref 0.0–0.7)
EOS PCT: 1 %
HCT: 47.7 % — ABNORMAL HIGH (ref 36.0–46.0)
HEMOGLOBIN: 15.9 g/dL — AB (ref 12.0–15.0)
Lymphocytes Relative: 9 %
Lymphs Abs: 1.1 10*3/uL (ref 0.7–4.0)
MCH: 30.8 pg (ref 26.0–34.0)
MCHC: 33.3 g/dL (ref 30.0–36.0)
MCV: 92.3 fL (ref 78.0–100.0)
MONO ABS: 0.4 10*3/uL (ref 0.1–1.0)
MONOS PCT: 3 %
NEUTROS PCT: 87 %
Neutro Abs: 10.1 10*3/uL — ABNORMAL HIGH (ref 1.7–7.7)
PLATELETS: 300 10*3/uL (ref 150–400)
RBC: 5.17 MIL/uL — ABNORMAL HIGH (ref 3.87–5.11)
RDW: 14.5 % (ref 11.5–15.5)
WBC: 11.5 10*3/uL — ABNORMAL HIGH (ref 4.0–10.5)

## 2015-07-15 LAB — BASIC METABOLIC PANEL
Anion gap: 9 (ref 5–15)
BUN: 10 mg/dL (ref 6–20)
CALCIUM: 8.5 mg/dL — AB (ref 8.9–10.3)
CO2: 27 mmol/L (ref 22–32)
CREATININE: 0.92 mg/dL (ref 0.44–1.00)
Chloride: 102 mmol/L (ref 101–111)
Glucose, Bld: 115 mg/dL — ABNORMAL HIGH (ref 65–99)
Potassium: 4.2 mmol/L (ref 3.5–5.1)
SODIUM: 138 mmol/L (ref 135–145)

## 2015-07-15 MED ORDER — MORPHINE SULFATE (PF) 4 MG/ML IV SOLN
4.0000 mg | Freq: Once | INTRAVENOUS | Status: AC
  Administered 2015-07-15: 4 mg via INTRAVENOUS
  Filled 2015-07-15: qty 1

## 2015-07-15 MED ORDER — METOCLOPRAMIDE HCL 5 MG/ML IJ SOLN: 10.0000 mg | Freq: Once | INTRAMUSCULAR | Status: DC

## 2015-07-15 MED ORDER — METOCLOPRAMIDE HCL 5 MG/ML IJ SOLN
10.0000 mg | Freq: Once | INTRAMUSCULAR | Status: AC
  Administered 2015-07-15: 10 mg via INTRAVENOUS
  Filled 2015-07-15: qty 2

## 2015-07-15 MED ORDER — DIPHENOXYLATE-ATROPINE 2.5-0.025 MG PO TABS: 2.0000 | ORAL_TABLET | Freq: Four times a day (QID) | ORAL | Status: DC | PRN

## 2015-07-15 MED ORDER — FENTANYL CITRATE (PF) 100 MCG/2ML IJ SOLN: 100.0000 ug | Freq: Once | INTRAMUSCULAR | Status: DC

## 2015-07-15 MED ORDER — SODIUM CHLORIDE 0.9 % IV BOLUS (SEPSIS): 1000.0000 mL | Freq: Once | INTRAVENOUS | Status: DC

## 2015-07-15 MED ORDER — ONDANSETRON HCL 4 MG/2ML IJ SOLN
4.0000 mg | Freq: Once | INTRAMUSCULAR | Status: AC
  Administered 2015-07-15: 4 mg via INTRAVENOUS
  Filled 2015-07-15: qty 2

## 2015-07-15 MED ORDER — FENTANYL CITRATE (PF) 100 MCG/2ML IJ SOLN
100.0000 ug | Freq: Once | INTRAMUSCULAR | Status: AC
  Administered 2015-07-15: 100 ug via INTRAVENOUS
  Filled 2015-07-15: qty 2

## 2015-07-15 MED ORDER — SODIUM CHLORIDE 0.9 % IV BOLUS (SEPSIS)
1000.0000 mL | Freq: Once | INTRAVENOUS | Status: AC
  Administered 2015-07-15: 1000 mL via INTRAVENOUS

## 2015-07-15 MED ORDER — ONDANSETRON HCL 4 MG/2ML IJ SOLN: 4.0000 mg | Freq: Once | INTRAMUSCULAR | Status: DC

## 2015-07-15 MED ORDER — OXYCODONE-ACETAMINOPHEN 5-325 MG PO TABS: 1.0000 | ORAL_TABLET | ORAL | Status: DC | PRN

## 2015-07-15 MED ORDER — ONDANSETRON HCL 8 MG PO TABS: 8.0000 mg | ORAL_TABLET | ORAL | Status: DC | PRN

## 2015-07-15 NOTE — ED Notes (Signed)
Patient states that she is not having any pain but still feels nauseated.

## 2015-07-15 NOTE — ED Provider Notes (Signed)
CSN: [id number]     Arrival date & time 07/15/15  1643 History   First MD Initiated Contact with Patient 07/15/15 1709     Chief Complaint  Patient presents with  . Diarrhea     (Consider location/radiation/quality/duration/timing/severity/associated sxs/prior Treatment) HPI..... Several episodes of watery diarrhea for several hours with associated abdominal pain. No vomiting. No fever, sweats, chills, chest pain, dyspnea, dysuria. Patient is morbidly obese. No blood or mucus in the stool.  Past Medical History  Diagnosis Date  . Hx of chlamydia infection   . HSV-2 (herpes simplex virus 2) infection   . Obesity   . Pain in right hip 08/09/2012  . Reflux 01/19/2014  . Nexplanon insertion 02/07/2014    Inserted left arm 02/07/14  . Nexplanon removal 02/07/2014  . Acid reflux    Past Surgical History  Procedure Laterality Date  . Cesarean section    . Colonoscopy N/A 09/01/2013    Procedure: COLONOSCOPY;  Surgeon: Rogene Houston, MD;  Location: [facility];  Service: Endoscopy;  Laterality: N/A;  100   Family History  Problem Relation Age of Onset  . Hypertension Mother   . Diabetes Mother   . COPD Mother   . Hypertension Sister   . Cancer Maternal Grandfather   . Cancer Father    Social History  Substance Use Topics  . Smoking status: Current Some Day Smoker -- 0.50 packs/day    Types: Cigarettes  . Smokeless tobacco: Never Used     Comment: on the weekends when she drinks  . Alcohol Use: Yes     Comment: on weekends   OB History    Gravida Para Term Preterm AB TAB SAB Ectopic Multiple Living   1 1 1       1      Review of Systems  All other systems reviewed and are negative.     Allergies  Keflex  Home Medications   Prior to Admission medications   Medication Sig Start Date End Date Taking? Authorizing Provider  albuterol (PROVENTIL HFA;VENTOLIN HFA) 108 (90 Base) MCG/ACT inhaler Inhale 2 puffs into the lungs every 6 (six) hours as needed for wheezing  or shortness of breath. 07/08/15  Yes Alexandra M Law, PA-C  benzonatate (TESSALON) 100 MG capsule Take 1 capsule (100 mg total) by mouth every 8 (eight) hours. 07/08/15  Yes Alexandra M Law, PA-C  Cetirizine HCl (ZYRTEC ALLERGY) 10 MG CAPS Take 1 capsule (10 mg total) by mouth daily. 07/08/15  Yes Alexandra M Law, PA-C  aspirin-sod bicarb-citric acid (ALKA-SELTZER) 325 MG TBEF tablet Take 325 mg by mouth every 6 (six) hours as needed (cold).    Historical Provider, MD  diphenoxylate-atropine (LOMOTIL) 2.5-0.025 MG tablet Take 2 tablets by mouth 4 (four) times daily as needed for diarrhea or loose stools. 07/15/15   Nat Christen, MD  etonogestrel (IMPLANON) 68 MG IMPL implant Inject 1 each into the skin once.    Historical Provider, MD  ondansetron (ZOFRAN) 8 MG tablet Take 1 tablet (8 mg total) by mouth every 4 (four) hours as needed. 07/15/15   Nat Christen, MD  oxyCODONE-acetaminophen (PERCOCET/ROXICET) 5-325 MG tablet Take 1-2 tablets by mouth every 4 (four) hours as needed for severe pain. 07/15/15   Nat Christen, MD   BP 151/95 mmHg  Pulse 92  Temp(Src) 97.9 F (36.6 C) (Oral)  Resp 24  Ht 5\' 4"  (1.626 m)  Wt 418 lb (189.604 kg)  BMI 71.71 kg/m2  SpO2 95% Physical Exam  Constitutional: She is oriented to person, place, and time.  Morbidly obese, ambulatory  HENT:  Head: Normocephalic and atraumatic.  Eyes: Conjunctivae and EOM are normal. Pupils are equal, round, and reactive to light.  Neck: Normal range of motion. Neck supple.  Cardiovascular: Normal rate and regular rhythm.   Pulmonary/Chest: Effort normal and breath sounds normal.  Abdominal: Soft. Bowel sounds are normal.  Minimal diffuse abdominal tenderness  Musculoskeletal: Normal range of motion.  Neurological: She is alert and oriented to person, place, and time.  Skin: Skin is warm and dry.  Psychiatric: She has a normal mood and affect. Her behavior is normal.  Nursing note and vitals reviewed.   ED Course  Procedures  (including critical care time) Labs Review Labs Reviewed  CBC WITH DIFFERENTIAL/PLATELET - Abnormal; Notable for the following:    WBC 11.5 (*)    RBC 5.17 (*)    Hemoglobin 15.9 (*)    HCT 47.7 (*)    Neutro Abs 10.1 (*)    All other components within normal limits  BASIC METABOLIC PANEL - Abnormal; Notable for the following:    Glucose, Bld 115 (*)    Calcium 8.5 (*)    All other components within normal limits    Imaging Review No results found. I have personally reviewed and evaluated these images and lab results as part of my medical decision-making.   EKG Interpretation None      MDM   Final diagnoses:  Diarrhea, unspecified type    No acute abdomen. Patient feels better after 2 L of IV fluids,IV IV morphine/fentanyl, IV Zofran, IV Reglan.  Discharge medications Percocet, Zofran 8 mg, Lomotil.    Nat Christen, MD 07/15/15 2040

## 2015-07-15 NOTE — ED Notes (Signed)
Pt c/o v/d that started about two hours ago.

## 2015-07-15 NOTE — ED Notes (Signed)
Pt states she has had vomiting and diarrhea for about 2 hours.

## 2015-07-15 NOTE — Discharge Instructions (Signed)
Increase fluids. Prescriptions for pain medicine, nausea medicine, diarrhea medicine. Rest.

## 2015-11-26 ENCOUNTER — Encounter (HOSPITAL_COMMUNITY): Payer: Self-pay | Admitting: Emergency Medicine

## 2015-11-26 ENCOUNTER — Emergency Department (HOSPITAL_COMMUNITY)
Admission: EM | Admit: 2015-11-26 | Discharge: 2015-11-26 | Disposition: A | Discharge status: Home / Self Care | Payer: PRIVATE HEALTH INSURANCE | Attending: Emergency Medicine | Admitting: Emergency Medicine

## 2015-11-26 DIAGNOSIS — K5793 Diverticulitis of intestine, part unspecified, without perforation or abscess with bleeding: Secondary | ICD-10-CM | POA: Insufficient documentation

## 2015-11-26 DIAGNOSIS — R1032 Left lower quadrant pain: Secondary | ICD-10-CM | POA: Diagnosis present

## 2015-11-26 DIAGNOSIS — F17210 Nicotine dependence, cigarettes, uncomplicated: Secondary | ICD-10-CM | POA: Diagnosis not present

## 2015-11-26 DIAGNOSIS — Z79899 Other long term (current) drug therapy: Secondary | ICD-10-CM | POA: Diagnosis not present

## 2015-11-26 LAB — URINALYSIS, ROUTINE W REFLEX MICROSCOPIC
Bilirubin Urine: NEGATIVE
Glucose, UA: NEGATIVE mg/dL
Hgb urine dipstick: NEGATIVE
Ketones, ur: NEGATIVE mg/dL
Leukocytes, UA: NEGATIVE
NITRITE: NEGATIVE
PH: 6 (ref 5.0–8.0)
Protein, ur: NEGATIVE mg/dL
SPECIFIC GRAVITY, URINE: 1.015 (ref 1.005–1.030)

## 2015-11-26 LAB — COMPREHENSIVE METABOLIC PANEL
ALBUMIN: 3.3 g/dL — AB (ref 3.5–5.0)
ALK PHOS: 46 U/L (ref 38–126)
ALT: 17 U/L (ref 14–54)
AST: 18 U/L (ref 15–41)
Anion gap: 6 (ref 5–15)
BILIRUBIN TOTAL: 0.5 mg/dL (ref 0.3–1.2)
BUN: 7 mg/dL (ref 6–20)
CALCIUM: 8.3 mg/dL — AB (ref 8.9–10.3)
CO2: 27 mmol/L (ref 22–32)
Chloride: 103 mmol/L (ref 101–111)
Creatinine, Ser: 0.75 mg/dL (ref 0.44–1.00)
GFR calc Af Amer: >60 mL/min (ref >60)
GFR calc non Af Amer: >60 mL/min (ref >60)
Glucose, Bld: 93 mg/dL (ref 65–99)
Potassium: 4 mmol/L (ref 3.5–5.1)
SODIUM: 136 mmol/L (ref 135–145)
TOTAL PROTEIN: 7.1 g/dL (ref 6.5–8.1)

## 2015-11-26 LAB — CBC
HEMATOCRIT: 44.5 % (ref 36.0–46.0)
HEMOGLOBIN: 14.5 g/dL (ref 12.0–15.0)
MCH: 30.3 pg (ref 26.0–34.0)
MCHC: 32.6 g/dL (ref 30.0–36.0)
MCV: 93.1 fL (ref 78.0–100.0)
Platelets: 332 10*3/uL (ref 150–400)
RBC: 4.78 MIL/uL (ref 3.87–5.11)
RDW: 13.9 % (ref 11.5–15.5)
WBC: 10.9 10*3/uL — ABNORMAL HIGH (ref 4.0–10.5)

## 2015-11-26 LAB — LIPASE, BLOOD: Lipase: 15 U/L (ref 11–51)

## 2015-11-26 MED ORDER — HYDROCODONE-ACETAMINOPHEN 5-325 MG PO TABS: 1.0000 | ORAL_TABLET | Freq: Four times a day (QID) | ORAL | 0 refills | Status: DC | PRN

## 2015-11-26 MED ORDER — METRONIDAZOLE 500 MG PO TABS
500.0000 mg | ORAL_TABLET | Freq: Two times a day (BID) | ORAL | 0 refills | Status: DC
Start: 2015-11-26 — End: 2016-02-10

## 2015-11-26 MED ORDER — METRONIDAZOLE 500 MG PO TABS
500.0000 mg | ORAL_TABLET | Freq: Once | ORAL | Status: AC
  Administered 2015-11-26: 500 mg via ORAL
  Filled 2015-11-26: qty 1

## 2015-11-26 MED ORDER — SODIUM CHLORIDE 0.9 % IV BOLUS (SEPSIS)
1000.0000 mL | Freq: Once | INTRAVENOUS | Status: AC
  Administered 2015-11-26: 1000 mL via INTRAVENOUS

## 2015-11-26 MED ORDER — CIPROFLOXACIN HCL 500 MG PO TABS: 500.0000 mg | ORAL_TABLET | Freq: Two times a day (BID) | ORAL | 0 refills | Status: DC

## 2015-11-26 MED ORDER — MORPHINE SULFATE (PF) 4 MG/ML IV SOLN
4.0000 mg | Freq: Once | INTRAVENOUS | Status: AC
  Administered 2015-11-26: 4 mg via INTRAVENOUS
  Filled 2015-11-26: qty 1

## 2015-11-26 MED ORDER — PROBIOTIC & ACIDOPHILUS EX ST PO CAPS: 1.0000 | ORAL_CAPSULE | Freq: Every day | ORAL | 0 refills | Status: DC

## 2015-11-26 MED ORDER — CIPROFLOXACIN HCL 250 MG PO TABS
500.0000 mg | ORAL_TABLET | Freq: Once | ORAL | Status: AC
  Administered 2015-11-26: 500 mg via ORAL
  Filled 2015-11-26: qty 2

## 2015-11-26 MED ORDER — ONDANSETRON HCL 4 MG/2ML IJ SOLN
4.0000 mg | Freq: Once | INTRAMUSCULAR | Status: AC
  Administered 2015-11-26: 4 mg via INTRAVENOUS
  Filled 2015-11-26: qty 2

## 2015-11-26 NOTE — ED Notes (Signed)
Patient denies pain and is resting comfortably.  

## 2015-11-26 NOTE — ED Provider Notes (Signed)
[name] DEPT Provider Note   CSN: [id number] Arrival date & time: 11/26/15  1617  First Provider Contact:  First MD Initiated Contact with Patient 11/26/15 1737        History   Chief Complaint Chief Complaint  Patient presents with  . Abdominal Pain    HPI Amy [name] is a 40 y.o. female.  Pt has a hx of diverticulitis and feels like she may have it again.  She has llq pain c/w prior episodes.  Last episode was about 1 month ago.    Abdominal Pain      Past Medical History:  Diagnosis Date  . Acid reflux   . HSV-2 (herpes simplex virus 2) infection   . Hx of chlamydia infection   . Nexplanon insertion 02/07/2014   Inserted left arm 02/07/14  . Nexplanon removal 02/07/2014  . Obesity   . Pain in right hip 08/09/2012  . Reflux 01/19/2014    Patient Active Problem List   Diagnosis Date Noted  . Abdominal pain, chronic, right lower quadrant 08/15/2014  . Abdominal pain   . Nexplanon insertion 02/07/2014  . Nexplanon removal 02/07/2014  . Reflux 01/19/2014  . Heme positive stool 08/04/2013  . Pain in right hip 08/09/2012  . Hx of chlamydia infection 08/05/2012  . 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 Obesity 08/05/2012    Past Surgical History:  Procedure Laterality Date  . CESAREAN SECTION    . COLONOSCOPY N/A 09/01/2013   Procedure: COLONOSCOPY;  Surgeon: Rogene Houston, MD;  Location: [facility];  Service: Endoscopy;  Laterality: N/A;  100    OB History    Gravida Para Term Preterm AB Living   1 1 1     1    SAB TAB Ectopic Multiple Live Births           1       Home Medications    Prior to Admission medications   Medication Sig Start Date End Date Taking? Authorizing Provider  albuterol (PROVENTIL HFA;VENTOLIN HFA) 108 (90 Base) MCG/ACT inhaler Inhale 2 puffs into the lungs every 6 (six) hours as needed for wheezing or shortness of breath. 07/08/15  Yes Alexandra M Law, PA-C  Cetirizine HCl (ZYRTEC ALLERGY) 10 MG  CAPS Take 1 capsule (10 mg total) by mouth daily. 07/08/15  Yes Alexandra M Law, PA-C  ciprofloxacin (CIPRO) 500 MG tablet Take 1 tablet (500 mg total) by mouth every 12 (twelve) hours. 11/26/15   Isla Pence, MD  etonogestrel (IMPLANON) 68 MG IMPL implant Inject 1 each into the skin once.    Historical Provider, MD  HYDROcodone-acetaminophen (NORCO/VICODIN) 5-325 MG tablet Take 1 tablet by mouth every 6 (six) hours as needed for severe pain. 11/26/15   Isla Pence, MD  metroNIDAZOLE (FLAGYL) 500 MG tablet Take 1 tablet (500 mg total) by mouth 2 (two) times daily. 11/26/15   Isla Pence, MD  Probiotic Product (PROBIOTIC & ACIDOPHILUS EX ST) CAPS Take 1 capsule by mouth daily. 11/26/15   Isla Pence, MD    Family History Family History  Problem Relation Age of Onset  . Hypertension Mother   . Diabetes Mother   . COPD Mother   . Hypertension Sister   . Cancer Maternal Grandfather   . Cancer Father     Social History Social History  Substance Use Topics  . Smoking status: Current Some Day Smoker    Packs/day: 0.50    Types: Cigarettes  . Smokeless tobacco: Never  Used     Comment: on the weekends when she drinks  . Alcohol use Yes     Comment: on weekends     Allergies   Keflex [cephalexin]   Review of Systems Review of Systems  Gastrointestinal: Positive for abdominal pain.  All other systems reviewed and are negative.    Physical Exam Updated Vital Signs BP 113/78 (BP Location: Right Arm)   Pulse 81   Temp 98.3 F (36.8 C) (Oral)   Resp 18   Ht 5\' 4"  (1.626 m)   Wt (!) 395 lb 1.6 oz (179.2 kg)   SpO2 97%   BMI 67.82 kg/m   Physical Exam  Constitutional: She is oriented to person, place, and time. She appears well-developed and well-nourished.  HENT:  Head: Normocephalic and atraumatic.  Right Ear: External ear normal.  Left Ear: External ear normal.  Nose: Nose normal.  Mouth/Throat: Oropharynx is clear and moist.  Eyes: Conjunctivae and EOM are  normal. Pupils are equal, round, and reactive to light.  Neck: Normal range of motion. Neck supple.  Cardiovascular: Normal rate, regular rhythm, normal heart sounds and intact distal pulses.   Pulmonary/Chest: Effort normal and breath sounds normal.  Abdominal: Soft. Bowel sounds are normal.    Pt is extremely obese.  Musculoskeletal: Normal range of motion.  Neurological: She is alert and oriented to person, place, and time.  Skin: Skin is warm and dry.  Psychiatric: She has a normal mood and affect. Her behavior is normal. Judgment and thought content normal.  Nursing note and vitals reviewed.    ED Treatments / Results  Labs (all labs ordered are listed, but only abnormal results are displayed) Labs Reviewed  COMPREHENSIVE METABOLIC PANEL - Abnormal; Notable for the following:       Result Value   Calcium 8.3 (*)    Albumin 3.3 (*)    All other components within normal limits  CBC - Abnormal; Notable for the following:    WBC 10.9 (*)    All other components within normal limits  LIPASE, BLOOD  URINALYSIS, ROUTINE W REFLEX MICROSCOPIC (NOT AT [facility])    EKG  EKG Interpretation None       Radiology No results found.  Procedures Procedures (including critical care time)  Medications Ordered in ED Medications  ciprofloxacin (CIPRO) tablet 500 mg (not administered)  metroNIDAZOLE (FLAGYL) tablet 500 mg (not administered)  morphine 4 MG/ML injection 4 mg (4 mg Intravenous Given 11/26/15 1835)  ondansetron (ZOFRAN) injection 4 mg (4 mg Intravenous Given 11/26/15 1835)  sodium chloride 0.9 % bolus 1,000 mL (1,000 mLs Intravenous New Bag/Given 11/26/15 1842)     Initial Impression / Assessment and Plan / ED Course  I have reviewed the triage vital signs and the nursing notes.  Pertinent labs & imaging results that were available during my care of the patient were reviewed by me and considered in my medical decision making (see chart for details).  Clinical Course    Pt is feeling better.  She has had diverticulitis in the past and this pain is similar.  Since her pain has improved, pt will be treated empirically.  She knows to return if worse.  I spoke with pt about the importance of a healthy diet to prevent further attacks.  She voices understanding.  Final Clinical Impressions(s) / ED Diagnoses   Final diagnoses:  Diverticulitis of intestine without perforation or abscess with bleeding    New Prescriptions New Prescriptions   CIPROFLOXACIN (CIPRO) 500  MG TABLET    Take 1 tablet (500 mg total) by mouth every 12 (twelve) hours.   HYDROCODONE-ACETAMINOPHEN (NORCO/VICODIN) 5-325 MG TABLET    Take 1 tablet by mouth every 6 (six) hours as needed for severe pain.   METRONIDAZOLE (FLAGYL) 500 MG TABLET    Take 1 tablet (500 mg total) by mouth 2 (two) times daily.   PROBIOTIC PRODUCT (PROBIOTIC & ACIDOPHILUS EX ST) CAPS    Take 1 capsule by mouth daily.     Isla Pence, MD 11/26/15 [phone]

## 2015-11-26 NOTE — ED Triage Notes (Signed)
Pt reports LLq abdominal pain that started yesterday. Pt states she has hx of diverticulitis. Pt says she usually has diarrhea but yesterday started having problems with constipation. Pt states she has been straining an dhas noticed blood in her stool.

## 2015-11-26 NOTE — ED Notes (Signed)
Instructed pt to take all of antibiotics as prescribed.Pt verbalized understanding of no driving and to use caution within 4 hours of taking pain meds due to meds cause drowsiness 

## 2016-02-10 ENCOUNTER — Emergency Department (HOSPITAL_COMMUNITY): Payer: PRIVATE HEALTH INSURANCE

## 2016-02-10 ENCOUNTER — Encounter (HOSPITAL_COMMUNITY): Payer: Self-pay | Admitting: Emergency Medicine

## 2016-02-10 ENCOUNTER — Emergency Department (HOSPITAL_COMMUNITY)
Admission: EM | Admit: 2016-02-10 | Discharge: 2016-02-10 | Disposition: A | Discharge status: Home / Self Care | Payer: PRIVATE HEALTH INSURANCE | Attending: Emergency Medicine | Admitting: Emergency Medicine

## 2016-02-10 DIAGNOSIS — M79672 Pain in left foot: Secondary | ICD-10-CM | POA: Diagnosis present

## 2016-02-10 DIAGNOSIS — F17210 Nicotine dependence, cigarettes, uncomplicated: Secondary | ICD-10-CM | POA: Insufficient documentation

## 2016-02-10 DIAGNOSIS — Z79899 Other long term (current) drug therapy: Secondary | ICD-10-CM | POA: Insufficient documentation

## 2016-02-10 DIAGNOSIS — G8929 Other chronic pain: Secondary | ICD-10-CM | POA: Diagnosis not present

## 2016-02-10 MED ORDER — HYDROCODONE-ACETAMINOPHEN 5-325 MG PO TABS
1.0000 | ORAL_TABLET | Freq: Once | ORAL | Status: AC
  Administered 2016-02-10: 1 via ORAL
  Filled 2016-02-10: qty 1

## 2016-02-10 MED ORDER — DICLOFENAC SODIUM 50 MG PO TBEC: 50.0000 mg | DELAYED_RELEASE_TABLET | Freq: Two times a day (BID) | ORAL | 0 refills | Status: DC

## 2016-02-10 NOTE — ED Provider Notes (Signed)
[facility] DEPT Provider Note   CSN: [id number] Arrival date & time: 02/10/16  1538   By signing my name below, I, Macon Large, attest that this documentation has been prepared under the direction and in the presence of Debroah Baller, NP. Electronically Signed: Macon Large, ED Scribe. 02/10/16. 4:30 PM.  History   Chief Complaint Chief Complaint  Patient presents with  . Foot Pain   The history is provided by the patient. No language interpreter was used.    HPI Comments: Amy [name] is a 40 y.o. female who presents to the Emergency Department complaining of moderate, constant, sharp left heel pain onset a year ago. Pt reports bilateral heel pain with gradually worsening left heel pain with associated swelling, with sharp burning pain radiating up to legs. She notes f/u with her PCP for similar symptoms this past year. Per pt, she reports PCP made a note that her bilateral heel pain may be due to her weight. They discussed possible cortisone injections and reviewed the benefits and risks. Pt notes she did not follow through with cortisone injections. Pt reports taking ibuprofen for swelling with minimal relief. She states pain is exacerbated with bearing weight and ambulation. She reports no modifying factors noted. She denies nausea, vomiting, fever, chills, leg swelling. No additional complaints at this time.   Past Medical History:  Diagnosis Date  . Acid reflux   . HSV-2 (herpes simplex virus 2) infection   . Hx of chlamydia infection   . Nexplanon insertion 02/07/2014   Inserted left arm 02/07/14  . Nexplanon removal 02/07/2014  . Obesity   . Pain in right hip 08/09/2012  . Reflux 01/19/2014    Patient Active Problem List   Diagnosis Date Noted  . Diarrhea 07/15/2015  . Abdominal pain, chronic, right lower quadrant 08/15/2014  . Abdominal pain   . Nexplanon insertion 02/07/2014  . Nexplanon removal 02/07/2014  . Reflux 01/19/2014  . Heme positive stool  08/04/2013  . Pain in right hip 08/09/2012  . Hx of chlamydia infection 08/05/2012  . 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 Obesity 08/05/2012    Past Surgical History:  Procedure Laterality Date  . CESAREAN SECTION    . COLONOSCOPY N/A 09/01/2013   Procedure: COLONOSCOPY;  Surgeon: Rogene Houston, MD;  Location: [facility];  Service: Endoscopy;  Laterality: N/A;  100    OB History    Gravida Para Term Preterm AB Living   1 1 1     1    SAB TAB Ectopic Multiple Live Births           1       Home Medications    Prior to Admission medications   Medication Sig Start Date End Date Taking? Authorizing Provider  albuterol (PROVENTIL HFA;VENTOLIN HFA) 108 (90 Base) MCG/ACT inhaler Inhale 2 puffs into the lungs every 6 (six) hours as needed for wheezing or shortness of breath. 07/08/15  Yes Alexandra M Law, PA-C  Cetirizine HCl (ZYRTEC ALLERGY) 10 MG CAPS Take 1 capsule (10 mg total) by mouth daily. 07/08/15  Yes [place], PA-C  etonogestrel (IMPLANON) 68 MG IMPL implant Inject 1 each into the skin once.   Yes Historical Provider, MD  linaclotide (LINZESS) 145 MCG CAPS capsule Take 145 mcg by mouth daily before breakfast.   Yes Historical Provider, MD  diclofenac (VOLTAREN) 50 MG EC tablet Take 1 tablet (50 mg total) by mouth 2 (two) times daily. 02/10/16   Edgerrin Correia  Bunnie Pion, NP    Family History Family History  Problem Relation Age of Onset  . Hypertension Mother   . Diabetes Mother   . COPD Mother   . Hypertension Sister   . Cancer Maternal Grandfather   . Cancer Father     Social History Social History  Substance Use Topics  . Smoking status: Current Some Day Smoker    Packs/day: 0.50    Types: Cigarettes  . Smokeless tobacco: Never Used     Comment: on the weekends when she drinks  . Alcohol use Yes     Comment: on weekends     Allergies   Keflex [cephalexin]   Review of Systems Review of Systems  Constitutional: Negative for chills and  fever.  Gastrointestinal: Negative for nausea and vomiting.  Musculoskeletal: Positive for arthralgias (left foot) and joint swelling (left foot).     Physical Exam Updated Vital Signs BP 130/84 (BP Location: Left Arm)   Pulse 92   Temp 97.8 F (36.6 C) (Oral)   Resp 18   Ht 5\' 4"  (1.626 m)   Wt (!) 175.7 kg   SpO2 98%   BMI 66.50 kg/m   Physical Exam  Constitutional: No distress.  Morbidly obese  HENT:  Head: Normocephalic and atraumatic.  Eyes: Conjunctivae are normal. Right eye exhibits no discharge. Left eye exhibits no discharge.  Cardiovascular: Normal rate.   Pulmonary/Chest: Effort normal. No respiratory distress.  Musculoskeletal: She exhibits tenderness.  Pedal pulses are 2+. Adeuqate circulation. No edema. Tenderness to left heel with palpation. Posterior aspect of the heel is tender to palpation.   Neurological: She is alert. Coordination normal.  Skin: Skin is warm and dry. No rash noted. She is not diaphoretic. No erythema.  Psychiatric: She has a normal mood and affect. Her behavior is normal.  Nursing note and vitals reviewed.    ED Treatments / Results   DIAGNOSTIC STUDIES: Oxygen Saturation is 98% on RA, normal by my interpretation.    COORDINATION OF CARE: 4:22 PM Discussed treatment plan with pt at bedside whic  Radiology Dg Foot Complete Left  Result Date: 02/10/2016 CLINICAL DATA:  PAIN IN LEFT HEEL, Pt reports bilat foot pain around the posterior aspect of both heels. Pt states she has had the problem "for a quite a while," has had x-rays PATIENT STATES " NO KNOWN INJURY" HISTORY OF OBESITY EXAM: LEFT FOOT - COMPLETE 3+ VIEW COMPARISON:  None. FINDINGS: There is no evidence of fracture or dislocation. Soft tissues are unremarkable. Small plantar and Achilles spurs are present. IMPRESSION: No evidence for acute  abnormality. Electronically Signed   By: Nolon Nations M.D.   On: 02/10/2016 17:26    Procedures Procedures (including critical  care time)  Medications Ordered in ED Medications  HYDROcodone-acetaminophen (NORCO/VICODIN) 5-325 MG per tablet 1 tablet (1 tablet Oral Given 02/10/16 1631)     Initial Impression / Assessment and Plan / ED Course  I have reviewed the triage vital signs and the nursing notes.  Pertinent imaging results that were available during my care of the patient were reviewed by me and considered in my medical decision making (see chart for details).  Clinical Course  40 y.o. morbidly obese female with hx of bilateral foot pain x 1 year and increased pain to the left heel x 1 weeks stable for d/c without fracture or dislocation noted on x-ray and no redness or signs of infection. Advised patient to f/u with her PCO or ortho for further  evaluation. Will prescribe NSAIDS.   Final Clinical Impressions(s) / ED Diagnoses   Final diagnoses:  Chronic foot pain, left    New Prescriptions Discharge Medication List as of 02/10/2016  5:48 PM    START taking these medications   Details  diclofenac (VOLTAREN) 50 MG EC tablet Take 1 tablet (50 mg total) by mouth 2 (two) times daily., Starting Sun 02/10/2016, Print       I personally performed the services described in this documentation, which was scribed in my presence. The recorded information has been reviewed and is accurate.     [address] [place], [place] 02/12/16 1347    Julianne Rice, MD 02/15/16 [phone]

## 2016-02-10 NOTE — ED Triage Notes (Signed)
Pt reports bilat foot pain around the posterior aspect of both heels. Pt states she has had the problem "for a quite a while," has had xrays.

## 2016-02-10 NOTE — Discharge Instructions (Signed)
Your x-ray today was normal. Follow up with Dr. Nevada Crane or Dr. Aline Brochure for further evaluation. Take the medication as directed. Return as needed.

## 2016-03-05 ENCOUNTER — Encounter (HOSPITAL_COMMUNITY): Payer: Self-pay

## 2016-03-05 ENCOUNTER — Emergency Department (HOSPITAL_COMMUNITY)
Admission: EM | Admit: 2016-03-05 | Discharge: 2016-03-05 | Disposition: A | Discharge status: Home / Self Care | Payer: PRIVATE HEALTH INSURANCE | Attending: Emergency Medicine | Admitting: Emergency Medicine

## 2016-03-05 DIAGNOSIS — R0981 Nasal congestion: Secondary | ICD-10-CM | POA: Diagnosis present

## 2016-03-05 DIAGNOSIS — F17210 Nicotine dependence, cigarettes, uncomplicated: Secondary | ICD-10-CM | POA: Diagnosis not present

## 2016-03-05 DIAGNOSIS — Z79899 Other long term (current) drug therapy: Secondary | ICD-10-CM | POA: Diagnosis not present

## 2016-03-05 DIAGNOSIS — J069 Acute upper respiratory infection, unspecified: Secondary | ICD-10-CM | POA: Insufficient documentation

## 2016-03-05 NOTE — ED Provider Notes (Signed)
[facility] DEPT Provider Note   CSN: [id number] Arrival date & time: 03/05/16  1525     History   Chief Complaint Chief Complaint  Patient presents with  . URI    HPI SHARADA SCHILTZ is a 40 y.o. female.  HPI  40 y.o. female, presents to the Emergency Department today complaining of URI symptoms since Monday. 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 Notes sinus congestion and rhinorrhea. No cough. No CP/SOB/ABD pain. No N/V/D. No fevers. OTC remedies such as Nyquil with minimal relief. Notes that the congestion is what bothers her the most. Worried for the flu. No other symptoms noted.   Past Medical History:  Diagnosis Date  . Acid reflux   . HSV-2 (herpes simplex virus 2) infection   . Hx of chlamydia infection   . Nexplanon insertion 02/07/2014   Inserted left arm 02/07/14  . Nexplanon removal 02/07/2014  . Obesity   . Pain in right hip 08/09/2012  . Reflux 01/19/2014    Patient Active Problem List   Diagnosis Date Noted  . Diarrhea 07/15/2015  . Abdominal pain, chronic, right lower quadrant 08/15/2014  . Abdominal pain   . Nexplanon insertion 02/07/2014  . Nexplanon removal 02/07/2014  . Reflux 01/19/2014  . Heme positive stool 08/04/2013  . Pain in right hip 08/09/2012  . Hx of chlamydia infection 08/05/2012  . 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 Obesity 08/05/2012    Past Surgical History:  Procedure Laterality Date  . CESAREAN SECTION    . COLONOSCOPY N/A 09/01/2013   Procedure: COLONOSCOPY;  Surgeon: Rogene Houston, MD;  Location: [facility];  Service: Endoscopy;  Laterality: N/A;  100    OB History    Gravida Para Term Preterm AB Living   1 1 1     1    SAB TAB Ectopic Multiple Live Births           1       Home Medications    Prior to Admission medications   Medication Sig Start Date End Date Taking? Authorizing Provider  albuterol (PROVENTIL HFA;VENTOLIN HFA) 108 (90 Base) MCG/ACT  inhaler Inhale 2 puffs into the lungs every 6 (six) hours as needed for wheezing or shortness of breath. 07/08/15   Frederica Kuster, PA-C  Cetirizine HCl (ZYRTEC ALLERGY) 10 MG CAPS Take 1 capsule (10 mg total) by mouth daily. 07/08/15   Frederica Kuster, PA-C  diclofenac (VOLTAREN) 50 MG EC tablet Take 1 tablet (50 mg total) by mouth 2 (two) times daily. 02/10/16   Hope Bunnie Pion, NP  etonogestrel (IMPLANON) 68 MG IMPL implant Inject 1 each into the skin once.    Historical Provider, MD  linaclotide (LINZESS) 145 MCG CAPS capsule Take 145 mcg by mouth daily before breakfast.    Historical Provider, MD    Family History Family History  Problem Relation Age of Onset  . Hypertension Mother   . Diabetes Mother   . COPD Mother   . Hypertension Sister   . Cancer Maternal Grandfather   . Cancer Father     Social History Social History  Substance Use Topics  . Smoking status: Current Some Day Smoker    Packs/day: 0.50    Types: Cigarettes  . Smokeless tobacco: Never Used     Comment: on the weekends when she drinks  . Alcohol use Yes     Comment: on weekends  Allergies   Keflex [cephalexin]   Review of Systems Review of Systems  Constitutional: Negative for fever.  HENT: Positive for sinus pressure. Negative for ear pain and sore throat.   Respiratory: Negative for cough and chest tightness.   Cardiovascular: Negative for chest pain.  Gastrointestinal: Negative for nausea and vomiting.   Physical Exam Updated Vital Signs BP 139/99 (BP Location: Left Arm)   Pulse 92   Temp 98.1 F (36.7 C) (Oral)   Resp 20   Ht 5\' 4"  (1.626 m)   Wt (!) 175.5 kg   SpO2 99%   BMI 66.43 kg/m   Physical Exam  Constitutional: She is oriented to person, place, and time. She appears well-developed and well-nourished. No distress.  HENT:  Head: Normocephalic and atraumatic.  Right Ear: Tympanic membrane, external ear and ear canal normal.  Left Ear: Tympanic membrane, external ear and ear  canal normal.  Nose: Nose normal.  Mouth/Throat: Uvula is midline, oropharynx is clear and moist and mucous membranes are normal. No trismus in the jaw. No oropharyngeal exudate, posterior oropharyngeal erythema or tonsillar abscesses.  Eyes: EOM are normal. Pupils are equal, round, and reactive to light.  Neck: Normal range of motion. Neck supple. No tracheal deviation present.  Cardiovascular: Normal rate, regular rhythm, S1 normal, S2 normal, normal heart sounds, intact distal pulses and normal pulses.   Pulmonary/Chest: Effort normal and breath sounds normal. No respiratory distress. She has no decreased breath sounds. She has no wheezes. She has no rhonchi. She has no rales.  Abdominal: Normal appearance and bowel sounds are normal. There is no tenderness.  Musculoskeletal: Normal range of motion.  Neurological: She is alert and oriented to person, place, and time.  Skin: Skin is warm and dry.  Psychiatric: She has a normal mood and affect. Her speech is normal and behavior is normal. Thought content normal.   ED Treatments / Results  Labs (all labs ordered are listed, but only abnormal results are displayed) Labs Reviewed - No data to display  EKG  EKG Interpretation None      Radiology No results found.  Procedures Procedures (including critical care time)  Medications Ordered in ED Medications - No data to display   Initial Impression / Assessment and Plan / ED Course  I have reviewed the triage vital signs and the nursing notes.  Pertinent labs & imaging results that were available during my care of the patient were reviewed by me and considered in my medical decision making (see chart for details).  Clinical Course    Final Clinical Impressions(s) / ED Diagnoses  I have reviewed the relevant previous healthcare records. I obtained HPI from historian.  ED Course:  Assessment: Pt is a 51yF presents with URI symptoms since Monday. No fever. No cough. No N/V/D . On  exam, pt in NAD. VSS. Afebrile. Lungs CTA, Heart RRR. Abdomen nontender/soft. No indication for CXR due to afebrile status and duration of symptoms. Patients symptoms are consistent with URI, likely viral etiology. Discussed that antibiotics are not indicated for viral infections. Pt will be discharged with symptomatic treatment.  Verbalizes understanding and is agreeable with plan. Pt is hemodynamically stable & in NAD prior to dc.  Disposition/Plan:  DC Home Additional Verbal discharge instructions given and discussed with patient.  Pt Instructed to f/u with PCP in the next week for evaluation and treatment of symptoms. Return precautions given Pt acknowledges and agrees with plan  Supervising Physician Francine Graven, DO   Final diagnoses:  Viral URI    New Prescriptions New Prescriptions   No medications on file     Shary Decamp, PA-C 03/05/16 [place], DO 03/05/16 1622

## 2016-03-05 NOTE — ED Triage Notes (Signed)
Pt reports started having cold symptoms since Monday night.  Reports fever, head congestion, cough, and body aches.

## 2016-03-05 NOTE — Discharge Instructions (Signed)
Please read and follow all provided instructions.  Your diagnoses today include:  1. Viral URI     You appear to have an upper respiratory infection (URI). An upper respiratory tract infection, or cold, is a viral infection of the air passages leading to the lungs. It should improve gradually after 5-7 days. You may have a lingering cough that lasts for 2- 4 weeks after the infection.  Tests performed today include: Vital signs. See below for your results today.   Medications prescribed:   Take any prescribed medications only as directed. Treatment for your infection is aimed at treating the symptoms. There are no medications, such as antibiotics, that will cure your infection.   Home care instructions:  Follow any educational materials contained in this packet.   Your illness is contagious and can be spread to others, especially during the first 3 or 4 days. It cannot be cured by antibiotics or other medicines. Take basic precautions such as washing your hands often, covering your mouth when you cough or sneeze, and avoiding public places where you could spread your illness to others.   Please continue drinking plenty of fluids.  Use over-the-counter medicines as needed as directed on packaging for symptom relief.  You may also use ibuprofen or tylenol as directed on packaging for pain or fever.  Do not take multiple medicines containing Tylenol or acetaminophen to avoid taking too much of this medication.  Follow-up instructions: Please follow-up with your primary care provider in the next 3 days for further evaluation of your symptoms if you are not feeling better.   Return instructions:  Please return to the Emergency Department if you experience worsening symptoms.  RETURN IMMEDIATELY IF you develop shortness of breath, confusion or altered mental status, a new rash, become dizzy, faint, or poorly responsive, or are unable to be cared for at home. Please return if you have persistent  vomiting and cannot keep down fluids or develop a fever that is not controlled by tylenol or motrin.   Please return if you have any other emergent concerns.  Additional Information:  Your vital signs today were: BP 139/99 (BP Location: Left Arm)    Pulse 92    Temp 98.1 F (36.7 C) (Oral)    Resp 20    Ht 5\' 4"  (1.626 m)    Wt (!) 175.5 kg    SpO2 99%    BMI 66.43 kg/m  If your blood pressure (BP) was elevated above 135/85 this visit, please have this repeated by your doctor within one month. --------------

## 2016-05-21 ENCOUNTER — Encounter (HOSPITAL_COMMUNITY): Payer: Self-pay

## 2016-05-21 ENCOUNTER — Emergency Department (HOSPITAL_COMMUNITY)
Admission: EM | Admit: 2016-05-21 | Discharge: 2016-05-21 | Disposition: A | Discharge status: Home / Self Care | Payer: PRIVATE HEALTH INSURANCE | Attending: Emergency Medicine | Admitting: Emergency Medicine

## 2016-05-21 DIAGNOSIS — F17210 Nicotine dependence, cigarettes, uncomplicated: Secondary | ICD-10-CM | POA: Diagnosis not present

## 2016-05-21 DIAGNOSIS — M79605 Pain in left leg: Secondary | ICD-10-CM | POA: Diagnosis not present

## 2016-05-21 DIAGNOSIS — M79604 Pain in right leg: Secondary | ICD-10-CM | POA: Insufficient documentation

## 2016-05-21 MED ORDER — IBUPROFEN 800 MG PO TABS
800.0000 mg | ORAL_TABLET | Freq: Once | ORAL | Status: AC
  Administered 2016-05-21: 800 mg via ORAL
  Filled 2016-05-21: qty 1

## 2016-05-21 MED ORDER — CYCLOBENZAPRINE HCL 5 MG PO TABS: 5.0000 mg | ORAL_TABLET | Freq: Three times a day (TID) | ORAL | 0 refills | Status: DC | PRN

## 2016-05-21 MED ORDER — IBUPROFEN 800 MG PO TABS: 800.0000 mg | ORAL_TABLET | Freq: Three times a day (TID) | ORAL | 0 refills | Status: DC

## 2016-05-21 MED ORDER — CYCLOBENZAPRINE HCL 10 MG PO TABS
5.0000 mg | ORAL_TABLET | Freq: Once | ORAL | Status: AC
  Administered 2016-05-21: 5 mg via ORAL
  Filled 2016-05-21: qty 1

## 2016-05-21 NOTE — ED Provider Notes (Signed)
[facility] DEPT Provider Note   CSN: [id number] Arrival date & time: 05/21/16  1737   By signing my name below, I, Soijett Blue, attest that this documentation has been prepared under the direction and in the presence of Drenda Freeze, MD. Electronically Signed: Soijett Blue, ED Scribe. 05/21/16. 6:41 PM.  History   Chief Complaint Chief Complaint  Patient presents with  . Leg Pain    HPI Amy [name] is a 41 y.o. female who presents to the Emergency Department complaining of bilateral ankle pain onset 1 year ago. Pt bilateral ankle pain radiates to her bilateral lower legs and is worsened with position change. 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 Pt has not tried any medications for the relief of her symptoms. She denies CP, abdominal pain, recent injury, recent fall, and any other symptoms.    The history is provided by the patient. No language interpreter was used.    Past Medical History:  Diagnosis Date  . Acid reflux   . HSV-2 (herpes simplex virus 2) infection   . Hx of chlamydia infection   . Nexplanon insertion 02/07/2014   Inserted left arm 02/07/14  . Nexplanon removal 02/07/2014  . Obesity   . Pain in right hip 08/09/2012  . Reflux 01/19/2014    Patient Active Problem List   Diagnosis Date Noted  . Diarrhea 07/15/2015  . Abdominal pain, chronic, right lower quadrant 08/15/2014  . Abdominal pain   . Nexplanon insertion 02/07/2014  . Nexplanon removal 02/07/2014  . Reflux 01/19/2014  . Heme positive stool 08/04/2013  . Pain in right hip 08/09/2012  . Hx of chlamydia infection 08/05/2012  . 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 Obesity 08/05/2012    Past Surgical History:  Procedure Laterality Date  . CESAREAN SECTION    . COLONOSCOPY N/A 09/01/2013   Procedure: COLONOSCOPY;  Surgeon: Rogene Houston, MD;  Location: [facility];  Service: Endoscopy;  Laterality: N/A;  100     OB History    Gravida Para Term Preterm AB Living   1 1 1     1    SAB TAB Ectopic Multiple Live Births           1       Home Medications    Prior to Admission medications   Medication Sig Start Date End Date Taking? Authorizing Provider  albuterol (PROVENTIL HFA;VENTOLIN HFA) 108 (90 Base) MCG/ACT inhaler Inhale 2 puffs into the lungs every 6 (six) hours as needed for wheezing or shortness of breath. 07/08/15   Frederica Kuster, PA-C  Cetirizine HCl (ZYRTEC ALLERGY) 10 MG CAPS Take 1 capsule (10 mg total) by mouth daily. 07/08/15   Frederica Kuster, PA-C  diclofenac (VOLTAREN) 50 MG EC tablet Take 1 tablet (50 mg total) by mouth 2 (two) times daily. 02/10/16   Hope Bunnie Pion, NP  etonogestrel (IMPLANON) 68 MG IMPL implant Inject 1 each into the skin once.    Historical Provider, MD  linaclotide (LINZESS) 145 MCG CAPS capsule Take 145 mcg by mouth daily before breakfast.    Historical Provider, MD    Family History Family History  Problem Relation Age of Onset  . Hypertension Mother   . Diabetes Mother   . COPD Mother   . Hypertension Sister   . Cancer Maternal Grandfather   . Cancer Father     Social History Social History  Substance Use  Topics  . Smoking status: Current Some Day Smoker    Packs/day: 0.50    Types: Cigarettes  . Smokeless tobacco: Never Used     Comment: on the weekends when she drinks  . Alcohol use Yes     Comment: on weekends     Allergies   Keflex [cephalexin]   Review of Systems Review of Systems  Musculoskeletal: Positive for arthralgias (bilateral ankles).  All other systems reviewed and are negative.    Physical Exam Updated Vital Signs BP (!) 151/103   Pulse 82   Temp 98.3 F (36.8 C) (Oral)   Resp 18   SpO2 99%   Physical Exam  Constitutional: She is oriented to person, place, and time. She appears well-developed and well-nourished. No distress.  HENT:  Head: Normocephalic and atraumatic.  Eyes: EOM are normal.  Neck:  Neck supple.  Cardiovascular: Normal rate.   Pulmonary/Chest: Effort normal. No respiratory distress.  Abdominal: She exhibits no distension.  Musculoskeletal: Normal range of motion.  Mild tenderness on the inferior and posterior aspect of bilateral ankles but no obvious ankle deformity or swelling. There is mild diffuse calf muscle tenderness. No pitting edema. Achilles tendon intact, nl thompson test. Able to ambulate on it. 2+ pulses   Neurological: She is alert and oriented to person, place, and time.  Skin: Skin is warm and dry.  Psychiatric: She has a normal mood and affect. Her behavior is normal.  Nursing note and vitals reviewed.    ED Treatments / Results  DIAGNOSTIC STUDIES: Oxygen Saturation is 99% on RA, nl by my interpretation.    COORDINATION OF CARE: 6:37 PM Discussed treatment plan with pt at bedside which includes motrin, flexeril, and pt agreed to plan.  Procedures Procedures (including critical care time)  Medications Ordered in ED Medications - No data to display   Initial Impression / Assessment and Plan / ED Course  I have reviewed the triage vital signs and the nursing notes.  Amy [name] is a 41 y.o. female here with bilateral ankle pain with radiation to the calf. She is on her feet a lot. She has no signs of DVT. Likely muscle or tendon strain from being on her leg. Will dc home with motrin, flexeril, ortho follow up.    Final Clinical Impressions(s) / ED Diagnoses   Final diagnoses:  None    New Prescriptions New Prescriptions   No medications on file   I personally performed the services described in this documentation, which was scribed in my presence. The recorded information has been reviewed and is accurate.     Drenda Freeze, MD 05/21/16 [phone]

## 2016-05-21 NOTE — ED Triage Notes (Signed)
Pt complaining of bilateral lower leg pain. Pt states ongoing x years. Pt denies any new injury/trauma. Pt with no obvious deformities at triage. Pt ambulatory at triage.

## 2016-05-21 NOTE — Discharge Instructions (Signed)
Take motrin for pain.   Take flexeril for muscle spasms.   Try to stay off your leg and elevate your leg.   See orthopedic doctor for follow up  Return to ER if you have worse calf pain, unable to walk, calf swelling, trouble breathing.

## 2016-05-27 ENCOUNTER — Ambulatory Visit (INDEPENDENT_AMBULATORY_CARE_PROVIDER_SITE_OTHER): Payer: PRIVATE HEALTH INSURANCE | Admitting: Orthopedic Surgery

## 2016-05-27 ENCOUNTER — Encounter (INDEPENDENT_AMBULATORY_CARE_PROVIDER_SITE_OTHER): Payer: Self-pay | Admitting: Orthopedic Surgery

## 2016-05-27 VITALS — Ht 64.0 in | Wt 387.0 lb

## 2016-05-27 DIAGNOSIS — M7662 Achilles tendinitis, left leg: Secondary | ICD-10-CM | POA: Diagnosis not present

## 2016-05-27 DIAGNOSIS — M7661 Achilles tendinitis, right leg: Secondary | ICD-10-CM

## 2016-05-27 MED ORDER — NITROGLYCERIN 0.2 MG/HR TD PT24: MEDICATED_PATCH | TRANSDERMAL | 12 refills | Status: DC

## 2016-05-27 MED ORDER — PREDNISONE 10 MG PO TABS: 10.0000 mg | ORAL_TABLET | Freq: Every day | ORAL | 0 refills | Status: DC

## 2016-05-27 NOTE — Progress Notes (Signed)
Office Visit Note   Patient: Amy [name]           Date of Birth: [birth year]           MRN: [id number] Visit Date: 05/27/2016              Requested by: Celene Squibb, MD [address] [place], [place] [address] PCP: Wende Neighbors, MD  Chief Complaint  Patient presents with  . Right Foot - Pain  . Left Foot - Pain    HPI: Patient is a 41 y.o woman who presents today for bilateral medial and lateral heel pain radiating to both calves. Patient works as a Marine scientist at [employer]. While in the standing position she is having to hold onto objects to stay upright due to pain with weightbearing. Patient has changed shoe wear without relief. She states her pain is at a 10 every day. She has been to [facility] numerous times, she has diagnostic imaging done that did not show fracture or dislocation, but does have bone spurring at left heel. Patient is unsure about the possibility of cortisone injections fearful of the side effects. She is currently taking ibuprofen 800mg  for her pain.  Maxcine Ham, RT    Assessment & Plan: Visit Diagnoses:  1. Achilles tendinitis, left leg   2. Achilles tendinitis, right leg     Plan: Plan for Achilles stretching 5 times a day minute each time this was demonstrated to her she states she understands. She will use one half nitroglycerin patch on each Achilles tendon change this daily and change location daily. Patient does have a history of diverticulitis and we will hold on nonsteroidals. We will give her prescription for low-dose prednisone take 10 mg every morning with breakfast.  Follow-Up Instructions: Return in about 4 weeks (around 06/24/2016).   Ortho Exam On examination patient is alert oriented no adenopathy well-dressed normal affect normal respiratory effort BMI greater than 50. Examination she has good pulses bilaterally. 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 She has tenderness to palpation at the insertion of the Achilles tendon. There is no pain on the plantar fascia lateral compression the calcaneus is nontender. No hypersensitivity to light touch. No dystrophy.  Imaging: No results found.  Orders:  No orders of the defined types were placed in this encounter.  Meds ordered this encounter  Medications  . nitroGLYCERIN (NITRODUR - DOSED IN MG/24 HR) 0.2 mg/hr patch    Sig: Use 1/2 patch on each achilles, change location daily    Dispense:  30 patch    Refill:  12  . predniSONE (DELTASONE) 10 MG tablet    Sig: Take 1 tablet (10 mg total) by mouth daily with breakfast.    Dispense:  30 tablet    Refill:  0     Procedures: No procedures performed  Clinical Data: No additional findings.  Subjective: Review of Systems  Objective: Vital Signs: Ht 5\' 4"  (1.626 m)   Wt (!) 387 lb (175.5 kg)   BMI 66.43 kg/m   Specialty Comments:  No specialty comments available.  PMFS History: Patient Active Problem List   Diagnosis Date Noted  . Achilles tendinitis, left leg 05/27/2016  . Achilles tendinitis, right leg 05/27/2016  . Diarrhea 07/15/2015  . Abdominal pain, chronic, right lower quadrant 08/15/2014  . Abdominal pain   . Nexplanon insertion 02/07/2014  .  Nexplanon removal 02/07/2014  . Reflux 01/19/2014  . Heme positive stool 08/04/2013  . Pain in right hip 08/09/2012  . Hx of chlamydia infection 08/05/2012  . 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 Obesity 08/05/2012   Past Medical History:  Diagnosis Date  . Acid reflux   . HSV-2 (herpes simplex virus 2) infection   . Hx of chlamydia infection   . Nexplanon insertion 02/07/2014   Inserted left arm 02/07/14  . Nexplanon removal 02/07/2014  . Obesity   . Pain in right hip 08/09/2012  . Reflux 01/19/2014    Family History  Problem Relation Age of Onset  . Hypertension Mother   . Diabetes Mother   . COPD  Mother   . Hypertension Sister   . Cancer Maternal Grandfather   . Cancer Father     Past Surgical History:  Procedure Laterality Date  . CESAREAN SECTION    . COLONOSCOPY N/A 09/01/2013   Procedure: COLONOSCOPY;  Surgeon: Rogene Houston, MD;  Location: [facility];  Service: Endoscopy;  Laterality: N/A;  100   Social History   Occupational History  . Not on file.   Social History Main Topics  . Smoking status: Current Some Day Smoker    Packs/day: 0.50    Types: Cigarettes  . Smokeless tobacco: Never Used     Comment: on the weekends when she drinks  . Alcohol use Yes     Comment: on weekends  . Drug use: No  . Sexual activity: Yes    Birth control/ protection: Implant

## 2016-06-12 ENCOUNTER — Encounter (HOSPITAL_COMMUNITY): Payer: Self-pay | Admitting: Emergency Medicine

## 2016-06-12 ENCOUNTER — Emergency Department (HOSPITAL_COMMUNITY): Payer: PRIVATE HEALTH INSURANCE

## 2016-06-12 ENCOUNTER — Emergency Department (HOSPITAL_COMMUNITY)
Admission: EM | Admit: 2016-06-12 | Discharge: 2016-06-12 | Disposition: A | Discharge status: Home / Self Care | Payer: PRIVATE HEALTH INSURANCE | Attending: Emergency Medicine | Admitting: Emergency Medicine

## 2016-06-12 DIAGNOSIS — Z79899 Other long term (current) drug therapy: Secondary | ICD-10-CM | POA: Insufficient documentation

## 2016-06-12 DIAGNOSIS — K602 Anal fissure, unspecified: Secondary | ICD-10-CM | POA: Insufficient documentation

## 2016-06-12 DIAGNOSIS — F17210 Nicotine dependence, cigarettes, uncomplicated: Secondary | ICD-10-CM | POA: Insufficient documentation

## 2016-06-12 DIAGNOSIS — K5900 Constipation, unspecified: Secondary | ICD-10-CM | POA: Diagnosis present

## 2016-06-12 LAB — CBC
HCT: 42.6 % (ref 36.0–46.0)
Hemoglobin: 14.1 g/dL (ref 12.0–15.0)
MCH: 30.6 pg (ref 26.0–34.0)
MCHC: 33.1 g/dL (ref 30.0–36.0)
MCV: 92.4 fL (ref 78.0–100.0)
PLATELETS: 279 10*3/uL (ref 150–400)
RBC: 4.61 MIL/uL (ref 3.87–5.11)
RDW: 14.6 % (ref 11.5–15.5)
WBC: 9.1 10*3/uL (ref 4.0–10.5)

## 2016-06-12 LAB — LIPASE, BLOOD: Lipase: 11 U/L (ref 11–51)

## 2016-06-12 LAB — COMPREHENSIVE METABOLIC PANEL
ALBUMIN: 3.5 g/dL (ref 3.5–5.0)
ALK PHOS: 49 U/L (ref 38–126)
ALT: 14 U/L (ref 14–54)
AST: 17 U/L (ref 15–41)
Anion gap: 7 (ref 5–15)
BILIRUBIN TOTAL: 0.5 mg/dL (ref 0.3–1.2)
BUN: 13 mg/dL (ref 6–20)
CO2: 26 mmol/L (ref 22–32)
CREATININE: 0.87 mg/dL (ref 0.44–1.00)
Calcium: 8.7 mg/dL — ABNORMAL LOW (ref 8.9–10.3)
Chloride: 105 mmol/L (ref 101–111)
GFR calc Af Amer: >60 mL/min (ref >60)
GFR calc non Af Amer: >60 mL/min (ref >60)
GLUCOSE: 104 mg/dL — AB (ref 65–99)
Potassium: 4 mmol/L (ref 3.5–5.1)
SODIUM: 138 mmol/L (ref 135–145)
TOTAL PROTEIN: 7.4 g/dL (ref 6.5–8.1)

## 2016-06-12 LAB — POC URINE PREG, ED: Preg Test, Ur: NEGATIVE

## 2016-06-12 LAB — POC OCCULT BLOOD, ED: FECAL OCCULT BLD: NEGATIVE

## 2016-06-12 MED ORDER — BISACODYL 10 MG RE SUPP: 10.0000 mg | RECTAL | 0 refills | Status: DC | PRN

## 2016-06-12 MED ORDER — ONDANSETRON HCL 4 MG/2ML IJ SOLN
4.0000 mg | Freq: Once | INTRAMUSCULAR | Status: AC | PRN
  Administered 2016-06-12: 4 mg via INTRAVENOUS
  Filled 2016-06-12: qty 2

## 2016-06-12 MED ORDER — MAGNESIUM CITRATE PO SOLN: ORAL | 0 refills | Status: DC

## 2016-06-12 MED ORDER — HYDROCORTISONE ACETATE 25 MG RE SUPP: 25.0000 mg | Freq: Two times a day (BID) | RECTAL | 0 refills | Status: DC

## 2016-06-12 NOTE — Discharge Instructions (Signed)
As discussed, based on your symptoms I suspect you do have an anal fissure (tear) although I was unable to see one during your exam.  Sit in a warm tub of water twice daily, followed by application of an anusol suppository to help heal this injury.  I also recommend using the dulcolax suppository to help soften the stool which is low, then try the magnesium citrate as instructed (start drinking this an hour after applying the dulcolax suppository, giving it enough time to work).  Plan followup with your doctor if this not effective.  I also recommend increasing the dose of miralax that you used to take on a daily basis until you have softer stools.  This can take days to start working you may need to adjust the dose until you have results.

## 2016-06-12 NOTE — ED Triage Notes (Signed)
Pt reports constipation x3 days with n/v sweating, chills.

## 2016-06-14 NOTE — ED Provider Notes (Signed)
[facility] DEPT Provider Note   CSN: [id number] Arrival date & time: 06/12/16  0727     History   Chief Complaint Chief Complaint  Patient presents with  . Constipation    HPI Amy [name] is a 41 y.o. female presenting for evaluation of constipation.  She has a history of intermittent problems with constipation and has tried a short course of miralax, colace and metamucil in the past without much relief. She has also tried linzess but this was stopped as it caused cramping.  Her last bm was 3 days ago. She has abdominal bloating along with rectal pressure.  She had a very small bm this am which did not relieve her symptoms, but were painful and sharp feeling when passed.  She reports getting very diaphoretic and sometimes nauseated when attempting to bm due to pain.  She believes she may have a fissure from straining. She does have known diverticulitis found on colonoscopy in 2015.  She denies rectal bleeding or localized left lower quadrant pain.  She has found no alleviators. The history is provided by the patient.  Constipation   Pertinent negatives include no abdominal pain.    Past Medical History:  Diagnosis Date  . Acid reflux   . HSV-2 (herpes simplex virus 2) infection   . Hx of chlamydia infection   . Nexplanon insertion 02/07/2014   Inserted left arm 02/07/14  . Nexplanon removal 02/07/2014  . Obesity   . Pain in right hip 08/09/2012  . Reflux 01/19/2014    Patient Active Problem List   Diagnosis Date Noted  . Achilles tendinitis, left leg 05/27/2016  . Achilles tendinitis, right leg 05/27/2016  . Diarrhea 07/15/2015  . Abdominal pain, chronic, right lower quadrant 08/15/2014  . Abdominal pain   . Nexplanon insertion 02/07/2014  . Nexplanon removal 02/07/2014  . Reflux 01/19/2014  . Heme positive stool 08/04/2013  . Pain in right hip 08/09/2012  . Hx of chlamydia infection 08/05/2012  . 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 Obesity  08/05/2012    Past Surgical History:  Procedure Laterality Date  . CESAREAN SECTION    . COLONOSCOPY N/A 09/01/2013   Procedure: COLONOSCOPY;  Surgeon: Rogene Houston, MD;  Location: [facility];  Service: Endoscopy;  Laterality: N/A;  100    OB History    Gravida Para Term Preterm AB Living   1 1 1     1    SAB TAB Ectopic Multiple Live Births           1       Home Medications    Prior to Admission medications   Medication Sig Start Date End Date Taking? Authorizing Provider  albuterol (PROVENTIL HFA;VENTOLIN HFA) 108 (90 Base) MCG/ACT inhaler Inhale 2 puffs into the lungs every 6 (six) hours as needed for wheezing or shortness of breath. 07/08/15  Yes Alexandra M Law, PA-C  Cetirizine HCl (ZYRTEC ALLERGY) 10 MG CAPS Take 1 capsule (10 mg total) by mouth daily. 07/08/15  Yes Alexandra M Law, PA-C  cyclobenzaprine (FLEXERIL) 5 MG tablet Take 1 tablet (5 mg total) by mouth 3 (three) times daily as needed for muscle spasms. 05/21/16  Yes Drenda Freeze, MD  etonogestrel (IMPLANON) 68 MG IMPL implant Inject 1 each into the skin once.   Yes Historical Provider, MD  ibuprofen (ADVIL,MOTRIN) 800 MG tablet Take 1 tablet (800 mg total) by mouth 3 (three) times daily. 05/21/16  Yes Drenda Freeze, MD  nitroGLYCERIN (NITRODUR - DOSED IN MG/24 HR) 0.2 mg/hr patch Use 1/2 patch on each achilles, change location daily 05/27/16  Yes Newt Minion, MD  predniSONE (DELTASONE) 10 MG tablet Take 1 tablet (10 mg total) by mouth daily with breakfast. 05/27/16  Yes Newt Minion, MD  bisacodyl (DULCOLAX) 10 MG suppository Place 1 suppository (10 mg total) rectally as needed for moderate constipation. 06/12/16   Evalee Jefferson, PA-C  diclofenac (VOLTAREN) 50 MG EC tablet Take 1 tablet (50 mg total) by mouth 2 (two) times daily. Patient not taking: Reported on 06/12/2016 02/10/16   Ashley Murrain, NP  hydrocortisone (ANUSOL-HC) 25 MG suppository Place 1 suppository (25 mg total) rectally 2 (two) times daily.  06/12/16   Evalee Jefferson, PA-C  linaclotide (LINZESS) 145 MCG CAPS capsule Take 145 mcg by mouth daily before breakfast.    Historical Provider, MD  magnesium citrate SOLN Drink 1/2 bottle, then repeat other 1/2 if no results in 1 hour. 06/12/16   Evalee Jefferson, PA-C    Family History Family History  Problem Relation Age of Onset  . Hypertension Mother   . Diabetes Mother   . COPD Mother   . Hypertension Sister   . Cancer Maternal Grandfather   . Cancer Father     Social History Social History  Substance Use Topics  . Smoking status: Current Some Day Smoker    Packs/day: 0.50    Types: Cigarettes  . Smokeless tobacco: Never Used     Comment: on the weekends when she drinks  . Alcohol use Yes     Comment: on weekends     Allergies   Keflex [cephalexin]   Review of Systems Review of Systems  Constitutional: Negative for fever.  HENT: Negative for congestion and sore throat.   Eyes: Negative.   Respiratory: Negative for chest tightness and shortness of breath.   Cardiovascular: Negative for chest pain.  Gastrointestinal: Positive for constipation, nausea and rectal pain. Negative for abdominal pain and anal bleeding.  Genitourinary: Negative.   Musculoskeletal: Negative for arthralgias, joint swelling and neck pain.  Skin: Negative.  Negative for rash and wound.  Neurological: Negative for dizziness, weakness, light-headedness, numbness and headaches.  Psychiatric/Behavioral: Negative.   All other systems reviewed and are negative.    Physical Exam Updated Vital Signs BP 119/73   Pulse 80   Temp 98.2 F (36.8 C) (Oral)   Resp 16   Ht 5\' 4"  (1.626 m)   Wt (!) 149.7 kg   SpO2 98%   BMI 56.64 kg/m   Physical Exam  Constitutional: She appears well-developed and well-nourished.  HENT:  Head: Normocephalic and atraumatic.  Eyes: Conjunctivae are normal.  Neck: Normal range of motion.  Cardiovascular: Normal rate, regular rhythm, normal heart sounds and intact  distal pulses.   Pulmonary/Chest: Effort normal and breath sounds normal. She has no wheezes.  Abdominal: Soft. Bowel sounds are normal. There is no tenderness.  Obese abdomen.  Genitourinary: Rectal exam shows no external hemorrhoid, no internal hemorrhoid, no fissure and guaiac negative stool.  Genitourinary Comments: No rectal impaction.  No fissures visualized.  Musculoskeletal: Normal range of motion.  Neurological: She is alert.  Skin: Skin is warm and dry.  Psychiatric: She has a normal mood and affect.  Nursing note and vitals reviewed.    ED Treatments / Results  Labs (all labs ordered are listed, but only abnormal results are displayed)  Results for orders placed or performed during the hospital encounter of 06/12/16  Lipase, blood  Result Value Ref Range   Lipase 11 11 - 51 U/L  Comprehensive metabolic panel  Result Value Ref Range   Sodium 138 135 - 145 mmol/L   Potassium 4.0 3.5 - 5.1 mmol/L   Chloride 105 101 - 111 mmol/L   CO2 26 22 - 32 mmol/L   Glucose, Bld 104 (H) 65 - 99 mg/dL   BUN 13 6 - 20 mg/dL   Creatinine, Ser 0.87 0.44 - 1.00 mg/dL   Calcium 8.7 (L) 8.9 - 10.3 mg/dL   Total Protein 7.4 6.5 - 8.1 g/dL   Albumin 3.5 3.5 - 5.0 g/dL   AST 17 15 - 41 U/L   ALT 14 14 - 54 U/L   Alkaline Phosphatase 49 38 - 126 U/L   Total Bilirubin 0.5 0.3 - 1.2 mg/dL   GFR calc non Af Amer >60 >60 mL/min   GFR calc Af Amer >60 >60 mL/min   Anion gap 7 5 - 15  CBC  Result Value Ref Range   WBC 9.1 4.0 - 10.5 K/uL   RBC 4.61 3.87 - 5.11 MIL/uL   Hemoglobin 14.1 12.0 - 15.0 g/dL   HCT 42.6 36.0 - 46.0 %   MCV 92.4 78.0 - 100.0 fL   MCH 30.6 26.0 - 34.0 pg   MCHC 33.1 30.0 - 36.0 g/dL   RDW 14.6 11.5 - 15.5 %   Platelets 279 150 - 400 K/uL  POC occult blood, ED Provider will collect  Result Value Ref Range   Fecal Occult Bld NEGATIVE NEGATIVE  POC Urine Pregnancy, ED (do NOT order at [facility])  Result Value Ref Range   Preg Test, Ur NEGATIVE NEGATIVE     EKG   EKG Interpretation None       Radiology  ABDOMEN - 1 VIEW    COMPARISON: No recent prior.    FINDINGS:  Soft tissue degenerative changes lumbar spine and both hips.    IMPRESSION:  Prominent amount of stool noted throughout the colon. Constipation  cannot be excluded. Several air-filled loops of small bowel are  noted with mild distention. Follow-up abdominal series suggested to  exclude developing adynamic ileus and/or partial small bowel  obstruction.      Procedures Procedures (including critical care time)  Medications Ordered in ED Medications  ondansetron (ZOFRAN) injection 4 mg (4 mg Intravenous Given 06/12/16 0807)     Initial Impression / Assessment and Plan / ED Course  I have reviewed the triage vital signs and the nursing notes.  Pertinent labs & imaging results that were available during my care of the patient were reviewed by me and considered in my medical decision making (see chart for details).     Lab results and imaging reviewed and discussed with pt including need for f/u care.  She has no low impaction.  Advised dulcolax suppository x 1 hour, then mag citrate.  Discussed return to miralax, may need to increase dosing until results obtained.  discused sitz baths and anusol for presumptive anal fissure, hx suggests this diagnosis, unable to find the fissure on exam.  Pt to call pcp for f/u if sx persist.  Strict return precautions discussed.  Final Clinical Impressions(s) / ED Diagnoses   Final diagnoses:  Constipation, unspecified constipation type  Anal fissure    New Prescriptions Discharge Medication List as of 06/12/2016 10:55 AM    START taking these medications   Details  bisacodyl (DULCOLAX) 10 MG suppository Place 1 suppository (10 mg  total) rectally as needed for moderate constipation., Starting Thu 06/12/2016, Print    hydrocortisone (ANUSOL-HC) 25 MG suppository Place 1 suppository (25 mg total) rectally 2 (two) times daily.,  Starting Thu 06/12/2016, Print    magnesium citrate SOLN Drink 1/2 bottle, then repeat other 1/2 if no results in 1 hour., Print         Evalee Jefferson, PA-C 06/14/16 2242    Noemi Chapel, MD 06/19/16 [phone]

## 2016-06-24 ENCOUNTER — Ambulatory Visit (INDEPENDENT_AMBULATORY_CARE_PROVIDER_SITE_OTHER): Payer: PRIVATE HEALTH INSURANCE | Admitting: Orthopedic Surgery

## 2016-08-06 ENCOUNTER — Emergency Department (HOSPITAL_COMMUNITY): Payer: PRIVATE HEALTH INSURANCE

## 2016-08-06 ENCOUNTER — Encounter (HOSPITAL_COMMUNITY): Payer: Self-pay | Admitting: Emergency Medicine

## 2016-08-06 ENCOUNTER — Emergency Department (HOSPITAL_COMMUNITY)
Admission: EM | Admit: 2016-08-06 | Discharge: 2016-08-06 | Disposition: A | Discharge status: Home / Self Care | Payer: PRIVATE HEALTH INSURANCE | Attending: Emergency Medicine | Admitting: Emergency Medicine

## 2016-08-06 DIAGNOSIS — Y9389 Activity, other specified: Secondary | ICD-10-CM | POA: Insufficient documentation

## 2016-08-06 DIAGNOSIS — M25561 Pain in right knee: Secondary | ICD-10-CM | POA: Insufficient documentation

## 2016-08-06 DIAGNOSIS — Y929 Unspecified place or not applicable: Secondary | ICD-10-CM | POA: Insufficient documentation

## 2016-08-06 DIAGNOSIS — Y999 Unspecified external cause status: Secondary | ICD-10-CM | POA: Insufficient documentation

## 2016-08-06 DIAGNOSIS — F17210 Nicotine dependence, cigarettes, uncomplicated: Secondary | ICD-10-CM | POA: Insufficient documentation

## 2016-08-06 DIAGNOSIS — Z79899 Other long term (current) drug therapy: Secondary | ICD-10-CM | POA: Insufficient documentation

## 2016-08-06 MED ORDER — ACETAMINOPHEN 325 MG PO TABS
650.0000 mg | ORAL_TABLET | Freq: Once | ORAL | Status: AC
  Administered 2016-08-06: 650 mg via ORAL
  Filled 2016-08-06: qty 2

## 2016-08-06 MED ORDER — IBUPROFEN 600 MG PO TABS: 600.0000 mg | ORAL_TABLET | Freq: Four times a day (QID) | ORAL | 0 refills | Status: DC | PRN

## 2016-08-06 MED ORDER — IBUPROFEN 400 MG PO TABS
600.0000 mg | ORAL_TABLET | Freq: Once | ORAL | Status: AC
  Administered 2016-08-06: 600 mg via ORAL
  Filled 2016-08-06: qty 2

## 2016-08-06 NOTE — Discharge Instructions (Signed)
Please read and follow all provided instructions.  Your diagnoses today include:  1. Acute pain of right knee     Tests performed today include: Vital signs. See below for your results today.   Medications prescribed:  Take as prescribed   Home care instructions:  Follow any educational materials contained in this packet.  Follow-up instructions: Please follow-up with your orthopedic provider for further evaluation of symptoms and treatment   Return instructions:  Please return to the Emergency Department if you do not get better, if you get worse, or new symptoms OR  - Fever (temperature greater than 101.74F)  - Bleeding that does not stop with holding pressure to the area    -Severe pain (please note that you may be more sore the day after your accident)  - Chest Pain  - Difficulty breathing  - Severe nausea or vomiting  - Inability to tolerate food and liquids  - Passing out  - Skin becoming red around your wounds  - Change in mental status (confusion or lethargy)  - New numbness or weakness    Please return if you have any other emergent concerns.  Additional Information:  Your vital signs today were: BP (!) 122/59 (BP Location: Left Arm)    Pulse 84    Temp 98.1 F (36.7 C) (Oral)    Resp 20    Ht 5\' 4"  (1.626 m)    Wt (!) 149.7 kg    SpO2 99%    BMI 56.64 kg/m  If your blood pressure (BP) was elevated above 135/85 this visit, please have this repeated by your doctor within one month. ---------------

## 2016-08-06 NOTE — ED Triage Notes (Signed)
PT c/o right knee pain that started after hearing a pop on 08/04/16 getting out of the car. PT ambulatory and states hx of knee pain as well.

## 2016-08-06 NOTE — ED Notes (Signed)
Pt made aware to return if symptoms worsen or if any life threatening symptoms occur.   

## 2016-08-06 NOTE — ED Provider Notes (Signed)
[facility] DEPT Provider Note   CSN: [id number] Arrival date & time: 08/06/16  5726     History   Chief Complaint Chief Complaint  Patient presents with  . Knee Pain    HPI Amy [name] is a 41 y.o. female.  HPI  41 y.o. female, presents to the Emergency Department today complaining of right knee pain after hearing a pop on 08-04-16 after getting out of a car. Pt states that she was climbing into her boyfriend jeep when she felt her knee "buckle." noted immediate pain afterwards. Feels like aching sensation. Rates pain 8/10. Noted pain with ambulation. Pt continued to go to work. States worsening symptoms since then. No numbness/tingling. Noted hx of same with left knee. No meds PTA. Notes it feels like when he tore her ligaments in her left knee.  No other symptoms noted.    Past Medical History:  Diagnosis Date  . Acid reflux   . HSV-2 (herpes simplex virus 2) infection   . Hx of chlamydia infection   . Nexplanon insertion 02/07/2014   Inserted left arm 02/07/14  . Nexplanon removal 02/07/2014  . Obesity   . Pain in right hip 08/09/2012  . Reflux 01/19/2014    Patient Active Problem List   Diagnosis Date Noted  . Achilles tendinitis, left leg 05/27/2016  . Achilles tendinitis, right leg 05/27/2016  . Diarrhea 07/15/2015  . Abdominal pain, chronic, right lower quadrant 08/15/2014  . Abdominal pain   . Nexplanon insertion 02/07/2014  . Nexplanon removal 02/07/2014  . Reflux 01/19/2014  . Heme positive stool 08/04/2013  . Pain in right hip 08/09/2012  . Hx of chlamydia infection 08/05/2012  . 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 Obesity 08/05/2012    Past Surgical History:  Procedure Laterality Date  . CESAREAN SECTION    . COLONOSCOPY N/A 09/01/2013   Procedure: COLONOSCOPY;  Surgeon: Rogene Houston, MD;  Location: [facility];  Service: Endoscopy;  Laterality: N/A;  100    OB History    Gravida Para Term Preterm AB Living   1 1 1      1    SAB TAB Ectopic Multiple Live Births           1       Home Medications    Prior to Admission medications   Medication Sig Start Date End Date Taking? Authorizing Provider  albuterol (PROVENTIL HFA;VENTOLIN HFA) 108 (90 Base) MCG/ACT inhaler Inhale 2 puffs into the lungs every 6 (six) hours as needed for wheezing or shortness of breath. 07/08/15   Frederica Kuster, PA-C  bisacodyl (DULCOLAX) 10 MG suppository Place 1 suppository (10 mg total) rectally as needed for moderate constipation. 06/12/16   Evalee Jefferson, PA-C  Cetirizine HCl (ZYRTEC ALLERGY) 10 MG CAPS Take 1 capsule (10 mg total) by mouth daily. 07/08/15   Frederica Kuster, PA-C  cyclobenzaprine (FLEXERIL) 5 MG tablet Take 1 tablet (5 mg total) by mouth 3 (three) times daily as needed for muscle spasms. 05/21/16   Drenda Freeze, MD  diclofenac (VOLTAREN) 50 MG EC tablet Take 1 tablet (50 mg total) by mouth 2 (two) times daily. Patient not taking: Reported on 06/12/2016 02/10/16   Ashley Murrain, NP  etonogestrel (IMPLANON) 68 MG IMPL implant Inject 1 each into the skin once.    Historical Provider, MD  hydrocortisone (ANUSOL-HC) 25 MG suppository Place 1 suppository (25 mg total) rectally 2 (two) times daily. 06/12/16   Evalee Jefferson,  PA-C  ibuprofen (ADVIL,MOTRIN) 800 MG tablet Take 1 tablet (800 mg total) by mouth 3 (three) times daily. 05/21/16   Drenda Freeze, MD  linaclotide [facility]) 145 MCG CAPS capsule Take 145 mcg by mouth daily before breakfast.    Historical Provider, MD  magnesium citrate SOLN Drink 1/2 bottle, then repeat other 1/2 if no results in 1 hour. 06/12/16   Evalee Jefferson, PA-C  nitroGLYCERIN (NITRODUR - DOSED IN MG/24 HR) 0.2 mg/hr patch Use 1/2 patch on each achilles, change location daily 05/27/16   Newt Minion, MD  predniSONE (DELTASONE) 10 MG tablet Take 1 tablet (10 mg total) by mouth daily with breakfast. 05/27/16   Newt Minion, MD    Family History Family History  Problem Relation Age of Onset  .  Hypertension Mother   . Diabetes Mother   . COPD Mother   . Hypertension Sister   . Cancer Maternal Grandfather   . Cancer Father     Social History Social History  Substance Use Topics  . Smoking status: Current Some Day Smoker    Packs/day: 0.50    Types: Cigarettes  . Smokeless tobacco: Never Used     Comment: on the weekends when she drinks  . Alcohol use Yes     Comment: on weekends     Allergies   Keflex [cephalexin]   Review of Systems Review of Systems  Constitutional: Negative for fever.  Gastrointestinal: Negative for nausea.  Musculoskeletal: Positive for arthralgias.  Neurological: Negative for numbness.   Physical Exam Updated Vital Signs BP (!) 122/59 (BP Location: Left Arm)   Pulse 84   Temp 98.1 F (36.7 C) (Oral)   Resp 20   Ht 5\' 4"  (1.626 m)   Wt (!) 149.7 kg   SpO2 99%   BMI 56.64 kg/m   Physical Exam  Constitutional: She is oriented to person, place, and time. Vital signs are normal. She appears well-developed and well-nourished.  HENT:  Head: Normocephalic.  Right Ear: Hearing normal.  Left Ear: Hearing normal.  Eyes: Conjunctivae and EOM are normal. Pupils are equal, round, and reactive to light.  Cardiovascular: Normal rate and regular rhythm.   Pulmonary/Chest: Effort normal.  Musculoskeletal:  Right Knee: Negative anterior/poster drawer bilaterally. Negative ballottement test. No varus or valgus laxity. No crepitus. Pain with flexion and extension. TTP on medial and lateral aspect of knee.   Neurological: She is alert and oriented to person, place, and time.  Skin: Skin is warm and dry.  Psychiatric: She has a normal mood and affect. Her speech is normal and behavior is normal. Thought content normal.   ED Treatments / Results  Labs (all labs ordered are listed, but only abnormal results are displayed) Labs Reviewed - No data to display  EKG  EKG Interpretation None       Radiology Dg Knee Complete 4 Views  Right  Result Date: 08/06/2016 CLINICAL DATA:  Right knee pain started after hearing a pop 08/04/2016. EXAM: RIGHT KNEE - COMPLETE 4+ VIEW COMPARISON:  None. FINDINGS: No evidence of fracture, dislocation, or joint effusion. No evidence of arthropathy or other focal bone abnormality. Heterotopic ossification along the periphery of the medial femoral condyle consistent with prior MCL injury. Soft tissues are unremarkable. IMPRESSION: No acute osseous injury of the right knee. Electronically Signed   By: Kathreen Devoid   On: 08/06/2016 09:13    Procedures Procedures (including critical care time)  Medications Ordered in ED Medications - No data to  display   Initial Impression / Assessment and Plan / ED Course  I have reviewed the triage vital signs and the nursing notes.  Pertinent labs & imaging results that were available during my care of the patient were reviewed by me and considered in my medical decision making (see chart for details).  Final Clinical Impressions(s) / ED Diagnoses   {I have reviewed and evaluated the relevant imaging studies.  {I have reviewed the relevant previous healthcare records.  {I obtained HPI from historian.   ED Course:  Assessment: Patient X-Ray negative for obvious fracture or dislocation.  Pt advised to follow up with orthopedics. Patient given brace and crutches while in ED, conservative therapy recommended and discussed. Patient will be discharged home & is agreeable with above plan. Returns precautions discussed. Pt appears safe for discharge.  Disposition/Plan:  DC Home Additional Verbal discharge instructions given and discussed with patient.  Pt Instructed to f/u with Ortho in the next week for evaluation and treatment of symptoms. Return precautions given Pt acknowledges and agrees with plan  Supervising Physician Francine Graven, DO  Final diagnoses:  Acute pain of right knee    New Prescriptions New Prescriptions   No medications on  file     Shary Decamp, PA-C 08/06/16 [place], DO 08/10/16 1651

## 2016-08-06 NOTE — ED Notes (Signed)
Knee sleeve would not fit pt, ace wrap applied, tyler, PA okay with this plan.

## 2016-10-23 ENCOUNTER — Emergency Department (HOSPITAL_COMMUNITY): Payer: PRIVATE HEALTH INSURANCE

## 2016-10-23 ENCOUNTER — Encounter (HOSPITAL_COMMUNITY): Payer: Self-pay

## 2016-10-23 ENCOUNTER — Emergency Department (HOSPITAL_COMMUNITY)
Admission: EM | Admit: 2016-10-23 | Discharge: 2016-10-23 | Disposition: A | Discharge status: Home / Self Care | Payer: PRIVATE HEALTH INSURANCE | Attending: Emergency Medicine | Admitting: Emergency Medicine

## 2016-10-23 DIAGNOSIS — R1110 Vomiting, unspecified: Secondary | ICD-10-CM | POA: Diagnosis present

## 2016-10-23 DIAGNOSIS — R197 Diarrhea, unspecified: Secondary | ICD-10-CM | POA: Insufficient documentation

## 2016-10-23 DIAGNOSIS — R112 Nausea with vomiting, unspecified: Secondary | ICD-10-CM | POA: Insufficient documentation

## 2016-10-23 DIAGNOSIS — Z79899 Other long term (current) drug therapy: Secondary | ICD-10-CM | POA: Diagnosis not present

## 2016-10-23 DIAGNOSIS — F17210 Nicotine dependence, cigarettes, uncomplicated: Secondary | ICD-10-CM | POA: Diagnosis not present

## 2016-10-23 LAB — COMPREHENSIVE METABOLIC PANEL
ALT: 14 U/L (ref 14–54)
AST: 21 U/L (ref 15–41)
Albumin: 3.3 g/dL — ABNORMAL LOW (ref 3.5–5.0)
Alkaline Phosphatase: 50 U/L (ref 38–126)
Anion gap: 8 (ref 5–15)
BILIRUBIN TOTAL: 0.7 mg/dL (ref 0.3–1.2)
BUN: 6 mg/dL (ref 6–20)
CO2: 28 mmol/L (ref 22–32)
CREATININE: 0.85 mg/dL (ref 0.44–1.00)
Calcium: 8.4 mg/dL — ABNORMAL LOW (ref 8.9–10.3)
Chloride: 102 mmol/L (ref 101–111)
GFR calc Af Amer: >60 mL/min (ref >60)
Glucose, Bld: 84 mg/dL (ref 65–99)
Potassium: 4.4 mmol/L (ref 3.5–5.1)
Sodium: 138 mmol/L (ref 135–145)
TOTAL PROTEIN: 7.2 g/dL (ref 6.5–8.1)

## 2016-10-23 LAB — URINALYSIS, ROUTINE W REFLEX MICROSCOPIC
BILIRUBIN URINE: NEGATIVE
Glucose, UA: NEGATIVE mg/dL
Hgb urine dipstick: NEGATIVE
Ketones, ur: NEGATIVE mg/dL
LEUKOCYTES UA: NEGATIVE
NITRITE: POSITIVE — AB
PH: 6 (ref 5.0–8.0)
Protein, ur: NEGATIVE mg/dL
SPECIFIC GRAVITY, URINE: 1.021 (ref 1.005–1.030)

## 2016-10-23 LAB — CBC
HCT: 42.6 % (ref 36.0–46.0)
Hemoglobin: 14 g/dL (ref 12.0–15.0)
MCH: 30.2 pg (ref 26.0–34.0)
MCHC: 32.9 g/dL (ref 30.0–36.0)
MCV: 92 fL (ref 78.0–100.0)
PLATELETS: 304 10*3/uL (ref 150–400)
RBC: 4.63 MIL/uL (ref 3.87–5.11)
RDW: 13.9 % (ref 11.5–15.5)
WBC: 11.4 10*3/uL — AB (ref 4.0–10.5)

## 2016-10-23 LAB — HCG, SERUM, QUALITATIVE: Preg, Serum: NEGATIVE

## 2016-10-23 LAB — LIPASE, BLOOD: Lipase: 17 U/L (ref 11–51)

## 2016-10-23 MED ORDER — PROMETHAZINE HCL 25 MG PO TABS: 25.0000 mg | ORAL_TABLET | Freq: Four times a day (QID) | ORAL | 0 refills | Status: DC | PRN

## 2016-10-23 MED ORDER — SODIUM CHLORIDE 0.9 % IV SOLN: 1000.0000 mL | INTRAVENOUS | Status: DC

## 2016-10-23 MED ORDER — SODIUM CHLORIDE 0.9 % IV BOLUS (SEPSIS)
1000.0000 mL | Freq: Once | INTRAVENOUS | Status: AC
  Administered 2016-10-23: 1000 mL via INTRAVENOUS

## 2016-10-23 MED ORDER — DICYCLOMINE HCL 20 MG PO TABS: 20.0000 mg | ORAL_TABLET | Freq: Two times a day (BID) | ORAL | 0 refills | Status: DC

## 2016-10-23 MED ORDER — ONDANSETRON HCL 4 MG/2ML IJ SOLN
4.0000 mg | Freq: Once | INTRAMUSCULAR | Status: AC
  Administered 2016-10-23: 4 mg via INTRAVENOUS
  Filled 2016-10-23: qty 2

## 2016-10-23 NOTE — Discharge Instructions (Signed)
Take medications as prescribed, follow-up with your primary care doctor or consider seeing a gastroenterologist

## 2016-10-23 NOTE — ED Provider Notes (Signed)
[facility] DEPT Provider Note   CSN: [id number] Arrival date & time: 10/23/16  1335     History   Chief Complaint Chief Complaint  Patient presents with  . Emesis    HPI Amy [name] is a 41 y.o. female.  HPI The patient presents to the emergency room with complaints of chronic abdominal pain and vomiting issues. Patient states she has been having trouble for years. Previously she saw Dr. Juel Burrow office. 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  Patient does not think that really has helped her symptoms.  She often vomits at least a couple times per day and sometimes even while she is at work. She will intermittently have loose stools. The pain is cramping diffuse throughout her abdomen. Patient states she has not able to eat properly. Since this recent exacerbation she has not eaten well since yesterday. Past Medical History:  Diagnosis Date  . Acid reflux   . HSV-2 (herpes simplex virus 2) infection   . Hx of chlamydia infection   . Nexplanon insertion 02/07/2014   Inserted left arm 02/07/14  . Nexplanon removal 02/07/2014  . Obesity   . Pain in right hip 08/09/2012  . Reflux 01/19/2014    Patient Active Problem List   Diagnosis Date Noted  . Achilles tendinitis, left leg 05/27/2016  . Achilles tendinitis, right leg 05/27/2016  . Diarrhea 07/15/2015  . Abdominal pain, chronic, right lower quadrant 08/15/2014  . Abdominal pain   . Nexplanon insertion 02/07/2014  . Nexplanon removal 02/07/2014  . Reflux 01/19/2014  . Heme positive stool 08/04/2013  . Pain in right hip 08/09/2012  . Hx of chlamydia infection 08/05/2012  . 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 Obesity 08/05/2012    Past Surgical History:  Procedure Laterality Date  . CESAREAN SECTION    . COLONOSCOPY N/A 09/01/2013   Procedure: COLONOSCOPY;  Surgeon: Rogene Houston, MD;  Location: [facility];  Service: Endoscopy;   Laterality: N/A;  100    OB History    Gravida Para Term Preterm AB Living   1 1 1     1    SAB TAB Ectopic Multiple Live Births           1       Home Medications    Prior to Admission medications   Medication Sig Start Date End Date Taking? Authorizing Provider  albuterol (PROVENTIL HFA;VENTOLIN HFA) 108 (90 Base) MCG/ACT inhaler Inhale 2 puffs into the lungs every 6 (six) hours as needed for wheezing or shortness of breath. 07/08/15  Yes Law, Bea Graff, PA-C  Cetirizine HCl (ZYRTEC ALLERGY) 10 MG CAPS Take 1 capsule (10 mg total) by mouth daily. 07/08/15  Yes Law, Bea Graff, PA-C  etonogestrel (IMPLANON) 68 MG IMPL implant Inject 1 each into the skin once.   Yes [provider]  ibuprofen (ADVIL,MOTRIN) 600 MG tablet Take 1 tablet (600 mg total) by mouth every 6 (six) hours as needed. 08/06/16  Yes Shary Decamp, PA-C  predniSONE (DELTASONE) 10 MG tablet Take 1 tablet (10 mg total) by mouth daily with breakfast. 05/27/16  Yes Newt Minion, MD  dicyclomine (BENTYL) 20 MG tablet Take 1 tablet (20 mg total) by mouth 2 (two) times daily. 10/23/16   Dorie Rank, MD  promethazine (PHENERGAN) 25 MG tablet Take 1 tablet (25 mg total) by mouth every 6 (six) hours as needed for nausea or vomiting. 10/23/16  Dorie Rank, MD    Family History Family History  Problem Relation Age of Onset  . Hypertension Mother   . Diabetes Mother   . COPD Mother   . Hypertension Sister   . Cancer Maternal Grandfather   . Cancer Father     Social History Social History  Substance Use Topics  . Smoking status: Current Some Day Smoker    Packs/day: 0.50    Types: Cigarettes  . Smokeless tobacco: Never Used     Comment: on the weekends when she drinks  . Alcohol use Yes     Comment: on weekends     Allergies   Keflex [cephalexin]   Review of Systems Review of Systems  All other systems reviewed and are negative.    Physical Exam Updated Vital Signs BP (!) 130/55 (BP Location: Right  Arm)   Pulse 80   Temp 98 F (36.7 C) (Oral)   Resp 20   Ht 1.651 m (5\' 5" )   Wt (!) 178.9 kg (394 lb 8 oz)   SpO2 98%   BMI 65.65 kg/m   Physical Exam  Constitutional: No distress.  Morbidly obese  HENT:  Head: Normocephalic and atraumatic.  Right Ear: External ear normal.  Left Ear: External ear normal.  Eyes: Conjunctivae are normal. Right eye exhibits no discharge. Left eye exhibits no discharge. No scleral icterus.  Neck: Neck supple. No tracheal deviation present.  Cardiovascular: Normal rate, regular rhythm and intact distal pulses.   Pulmonary/Chest: Effort normal and breath sounds normal. No stridor. No respiratory distress. She has no wheezes. She has no rales.  Abdominal: Soft. Bowel sounds are normal. She exhibits no distension. There is tenderness in the epigastric area. There is no rigidity, no rebound and no guarding. No hernia.  Excess adipose tissue, protuberant  Musculoskeletal: She exhibits no edema or tenderness.  Neurological: She is alert. She has normal strength. No cranial nerve deficit (no facial droop, extraocular movements intact, no slurred speech) or sensory deficit. She exhibits normal muscle tone. She displays no seizure activity. Coordination normal.  Skin: Skin is warm and dry. No rash noted.  Psychiatric: She has a normal mood and affect.  Nursing note and vitals reviewed.    ED Treatments / Results  Labs (all labs ordered are listed, but only abnormal results are displayed) Labs Reviewed  COMPREHENSIVE METABOLIC PANEL - Abnormal; Notable for the following:       Result Value   Calcium 8.4 (*)    Albumin 3.3 (*)    All other components within normal limits  CBC - Abnormal; Notable for the following:    WBC 11.4 (*)    All other components within normal limits  URINALYSIS, ROUTINE W REFLEX MICROSCOPIC - Abnormal; Notable for the following:    Color, Urine AMBER (*)    APPearance HAZY (*)    Nitrite POSITIVE (*)    Bacteria, UA MANY (*)     Squamous Epithelial / LPF 6-30 (*)    All other components within normal limits  LIPASE, BLOOD  HCG, SERUM, QUALITATIVE      Radiology Dg Abdomen Acute W/chest  Result Date: 10/23/2016 CLINICAL DATA:  Abdominal pain, vomiting, diarrhea EXAM: DG ABDOMEN ACUTE W/ 1V CHEST COMPARISON:  Abdomen films of 06/12/2016, and chest x-ray of 07/08/2015 FINDINGS: No active infiltrate or effusion is seen. Mediastinal and hilar contours are unremarkable. The heart is within normal limits in size. Supine and erect views of the abdomen show no bowel obstruction. No  free air is seen on the erect chest x-ray, with the abdomen erect view not including hemidiaphragms. No opaque calculi are seen. No bony abnormality is noted. IMPRESSION: 1. No active lung disease. 2. No bowel obstruction.  No free air as noted above. Electronically Signed   By: Ivar Drape M.D.   On: 10/23/2016 16:12    Procedures Procedures (including critical care time)  Medications Ordered in ED Medications  sodium chloride 0.9 % bolus 1,000 mL (1,000 mLs Intravenous New Bag/Given 10/23/16 1525)    Followed by  0.9 %  sodium chloride infusion (not administered)  ondansetron (ZOFRAN) injection 4 mg (4 mg Intravenous Given 10/23/16 1525)     Initial Impression / Assessment and Plan / ED Course  I have reviewed the triage vital signs and the nursing notes.  Pertinent labs & imaging results that were available during my care of the patient were reviewed by me and considered in my medical decision making (see chart for details).  Clinical Course as of Oct 23 1840  Thu Oct 23, 2016  1507 Patient presents with chronic vomiting and diarrhea. Patient does not appear to have any calorie malnutrition as she is morbidly obese.  Doubt celiac disease or obstruction.  IBS is a possibility.  Will check labs, give her fluids, antiemetics.  [JK]    Clinical Course User Index [JK] Dorie Rank, MD   The patient's laboratory tests are reassuring. No  signs of dehydration. LFTs are normal. Mild increase in white blood cell count. Her exam is not suggestive of appendicitis, colitis or diverticulitis. Patient's urinalysis does show positive nitrites and many bacteria but there are squamous epithelial cells.  She's not having any urinary symptoms. I doubt UTI. Considering her chronic issues of vomiting and diarrhea. Recommend follow-up with the gastroenterologist. Discharge home with Phenergan and Bentyl.   Final Clinical Impressions(s) / ED Diagnoses   Final diagnoses:  Nausea vomiting and diarrhea    New Prescriptions New Prescriptions   DICYCLOMINE (BENTYL) 20 MG TABLET    Take 1 tablet (20 mg total) by mouth 2 (two) times daily.   PROMETHAZINE (PHENERGAN) 25 MG TABLET    Take 1 tablet (25 mg total) by mouth every 6 (six) hours as needed for nausea or vomiting.     Dorie Rank, MD 10/23/16 [phone]

## 2016-10-23 NOTE — ED Notes (Signed)
Patient transported to X-ray 

## 2016-10-23 NOTE — ED Triage Notes (Signed)
Pt reports that she has been vomiting for years. Pt states she has seen Dr [place] Crane in past and was given linzess for IBS. Today her abdomen has constant pains and everything she has eaten or drank comes back up

## 2016-10-23 NOTE — ED Notes (Signed)
Instructed pt on getting an urine sample

## 2016-10-23 NOTE — ED Notes (Signed)
ED Provider at bedside. 

## 2016-10-26 LAB — URINE CULTURE: Culture: >=100000 — AB

## 2016-10-27 ENCOUNTER — Telehealth: Payer: Self-pay | Admitting: Emergency Medicine

## 2016-10-27 NOTE — Telephone Encounter (Signed)
Post ED Visit - Positive Culture Follow-up  Culture report reviewed by antimicrobial stewardship pharmacist:  []  Elenor Quinones, Pharm.D. []  Heide Guile, Pharm.D., BCPS AQ-ID []  Parks Neptune, Pharm.D., BCPS [x]  Alycia Rossetti, Pharm.D., BCPS []  [place], [place].D., BCPS, AAHIVP []  Legrand Como, Pharm.D., BCPS, AAHIVP []  Salome Arnt, PharmD, BCPS []  Dimitri Ped, PharmD, BCPS []  Vincenza Hews, PharmD, BCPS  Positive urine culture Treated with none, likely contaminant, no further patient follow-up is required at this time.  Hazle Nordmann 10/27/2016, 10:38 AM

## 2016-11-06 ENCOUNTER — Encounter (HOSPITAL_COMMUNITY): Payer: Self-pay | Admitting: *Deleted

## 2016-11-06 ENCOUNTER — Emergency Department (HOSPITAL_COMMUNITY): Payer: PRIVATE HEALTH INSURANCE

## 2016-11-06 ENCOUNTER — Emergency Department (HOSPITAL_COMMUNITY)
Admission: EM | Admit: 2016-11-06 | Discharge: 2016-11-06 | Disposition: A | Discharge status: Home / Self Care | Payer: PRIVATE HEALTH INSURANCE | Attending: Emergency Medicine | Admitting: Emergency Medicine

## 2016-11-06 DIAGNOSIS — Y939 Activity, unspecified: Secondary | ICD-10-CM | POA: Diagnosis not present

## 2016-11-06 DIAGNOSIS — Y999 Unspecified external cause status: Secondary | ICD-10-CM | POA: Diagnosis not present

## 2016-11-06 DIAGNOSIS — F17210 Nicotine dependence, cigarettes, uncomplicated: Secondary | ICD-10-CM | POA: Diagnosis not present

## 2016-11-06 DIAGNOSIS — W010XXA Fall on same level from slipping, tripping and stumbling without subsequent striking against object, initial encounter: Secondary | ICD-10-CM | POA: Insufficient documentation

## 2016-11-06 DIAGNOSIS — S39012A Strain of muscle, fascia and tendon of lower back, initial encounter: Secondary | ICD-10-CM | POA: Insufficient documentation

## 2016-11-06 DIAGNOSIS — S3992XA Unspecified injury of lower back, initial encounter: Secondary | ICD-10-CM | POA: Diagnosis present

## 2016-11-06 DIAGNOSIS — Y92009 Unspecified place in unspecified non-institutional (private) residence as the place of occurrence of the external cause: Secondary | ICD-10-CM | POA: Insufficient documentation

## 2016-11-06 LAB — POC URINE PREG, ED: Preg Test, Ur: NEGATIVE

## 2016-11-06 MED ORDER — OXYCODONE-ACETAMINOPHEN 5-325 MG PO TABS: 1.0000 | ORAL_TABLET | ORAL | 0 refills | Status: DC | PRN

## 2016-11-06 MED ORDER — PREDNISONE 10 MG PO TABS: ORAL_TABLET | ORAL | 0 refills | Status: DC

## 2016-11-06 MED ORDER — CYCLOBENZAPRINE HCL 5 MG PO TABS: 5.0000 mg | ORAL_TABLET | Freq: Three times a day (TID) | ORAL | 0 refills | Status: DC | PRN

## 2016-11-06 NOTE — Discharge Instructions (Signed)
Take the medicines as prescribed.  Use the the other medicines as directed.  Do not drive within 4 hours of taking oxycodone as this will make you drowsy.  Avoid lifting,  Bending,  Twisting or any other activity that worsens your pain over the next week.  Apply a heating pad to your back 20 minutes 3-4 times daily.  You should get rechecked if your symptoms are not better over the next 7 days, or you develop increased pain,  Weakness in your leg(s) or loss of bladder or bowel function - these are symptoms of a worsening condition.  Your xray does not give [place] the reason for your pain today, no fractures or dislocations.  I suspect you have a deep contusion and possibly muscle spasm as well.

## 2016-11-06 NOTE — ED Notes (Signed)
Fell Monday evening at her sister's house on rug.  Injury to right lower back,  Denies hitting head or dizziness.

## 2016-11-06 NOTE — ED Triage Notes (Signed)
Chronic low back pain, also states she fell 4 days ago abd now has pain in her right side. states she needs a work note

## 2016-11-08 NOTE — ED Provider Notes (Signed)
[facility] DEPT Provider Note   CSN: [id number] Arrival date & time: 11/06/16  1102     History   Chief Complaint Chief Complaint  Patient presents with  . Back Pain    HPI Amy [name] is a 41 y.o. female with chronic intermittent low back pain presenting with worsened pain radiating into her right lower back and flank area since slipping and falling on her sisters newly waxed floors 4 days ago.  She denies any other injury or pain including head, neck or hip pain.   There is no radiation of pain into the right leg and no weakness or numbness, no urinary or bowel retention or incontinence.  Patient does not have a history of cancer or IVDU.  The patient has tried ibuprofen,ice and heat without significant relief of symptoms. .  The history is provided by the patient.  Back Pain   Pertinent negatives include no chest pain, no fever, no numbness, no abdominal pain, no dysuria and no weakness.    Past Medical History:  Diagnosis Date  . Acid reflux   . HSV-2 (herpes simplex virus 2) infection   . Hx of chlamydia infection   . Nexplanon insertion 02/07/2014   Inserted left arm 02/07/14  . Nexplanon removal 02/07/2014  . Obesity   . Pain in right hip 08/09/2012  . Reflux 01/19/2014    Patient Active Problem List   Diagnosis Date Noted  . Achilles tendinitis, left leg 05/27/2016  . Achilles tendinitis, right leg 05/27/2016  . Diarrhea 07/15/2015  . Abdominal pain, chronic, right lower quadrant 08/15/2014  . Abdominal pain   . Nexplanon insertion 02/07/2014  . Nexplanon removal 02/07/2014  . Reflux 01/19/2014  . Heme positive stool 08/04/2013  . Pain in right hip 08/09/2012  . Hx of chlamydia infection 08/05/2012  . 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 Obesity 08/05/2012    Past Surgical History:  Procedure Laterality Date  . CESAREAN SECTION    . COLONOSCOPY N/A 09/01/2013   Procedure: COLONOSCOPY;  Surgeon: Rogene Houston, MD;  [facility];  Service: Endoscopy;  Laterality: N/A;  100    OB History    Gravida Para Term Preterm AB Living   1 1 1     1    SAB TAB Ectopic Multiple Live Births           1       Home Medications    Prior to Admission medications   Medication Sig Start Date End Date Taking? Authorizing Provider  albuterol (PROVENTIL HFA;VENTOLIN HFA) 108 (90 Base) MCG/ACT inhaler Inhale 2 puffs into the lungs every 6 (six) hours as needed for wheezing or shortness of breath. 07/08/15  Yes Law, Bea Graff, PA-C  Cetirizine HCl (ZYRTEC ALLERGY) 10 MG CAPS Take 1 capsule (10 mg total) by mouth daily. 07/08/15  Yes Law, Bea Graff, PA-C  dicyclomine (BENTYL) 20 MG tablet Take 1 tablet (20 mg total) by mouth 2 (two) times daily. 10/23/16  Yes Dorie Rank, MD  etonogestrel (IMPLANON) 68 MG IMPL implant Inject 1 each into the skin once.   Yes [provider]  ibuprofen (ADVIL,MOTRIN) 600 MG tablet Take 1 tablet (600 mg total) by mouth every 6 (six) hours as needed. 08/06/16  Yes Shary Decamp, PA-C  promethazine (PHENERGAN) 25 MG tablet Take 1 tablet (25 mg total) by mouth every 6 (six) hours as needed for nausea or vomiting. 10/23/16  Yes Dorie Rank, MD  cyclobenzaprine Yvette Rack)  5 MG tablet Take 1 tablet (5 mg total) by mouth 3 (three) times daily as needed for muscle spasms. 11/06/16   Evalee Jefferson, PA-C  oxyCODONE-acetaminophen (PERCOCET/ROXICET) 5-325 MG tablet Take 1 tablet by mouth every 4 (four) hours as needed. 11/06/16   Evalee Jefferson, PA-C  predniSONE (DELTASONE) 10 MG tablet Take 6 tablets day one, 5 tablets day two, 4 tablets day three, 3 tablets day four, 2 tablets day five, then 1 tablet day six 11/06/16   Evalee Jefferson, PA-C    Family History Family History  Problem Relation Age of Onset  . Hypertension Mother   . Diabetes Mother   . COPD Mother   . Hypertension Sister   . Cancer Maternal Grandfather   . Cancer Father     Social History Social History  Substance Use Topics  . Smoking  status: Current Some Day Smoker    Packs/day: 0.50    Types: Cigarettes  . Smokeless tobacco: Never Used     Comment: on the weekends when she drinks  . Alcohol use Yes     Comment: on weekends     Allergies   Keflex [cephalexin]   Review of Systems Review of Systems  Constitutional: Negative for fever.  Respiratory: Negative for shortness of breath.   Cardiovascular: Negative for chest pain and leg swelling.  Gastrointestinal: Negative for abdominal distention, abdominal pain and constipation.  Genitourinary: Negative for difficulty urinating, dysuria, flank pain, frequency and urgency.  Musculoskeletal: Positive for back pain. Negative for gait problem and joint swelling.  Skin: Negative for rash.  Neurological: Negative for weakness and numbness.     Physical Exam Updated Vital Signs BP 109/82   Pulse 80   Temp 98.2 F (36.8 C) (Oral)   Resp 20   Ht 5\' 6"  (1.676 m)   Wt (!) 178.7 kg (394 lb)   SpO2 100%   BMI 63.59 kg/m   Physical Exam  Constitutional: She appears well-developed and well-nourished.  HENT:  Head: Normocephalic.  Eyes: Conjunctivae are normal.  Neck: Normal range of motion. Neck supple.  Cardiovascular: Normal rate and intact distal pulses.   Pedal pulses normal.  Pulmonary/Chest: Effort normal.  Abdominal: Soft. Bowel sounds are normal. She exhibits no distension and no mass.  Musculoskeletal: Normal range of motion. She exhibits no edema.       Lumbar back: She exhibits tenderness. She exhibits no bony tenderness, no swelling, no edema and no spasm.       Back:  Neurological: She is alert. She has normal strength. She displays no atrophy and no tremor. No sensory deficit. Gait normal.  Reflex Scores:      Patellar reflexes are 2+ on the right side and 2+ on the left side.      Achilles reflexes are 2+ on the right side and 2+ on the left side. No strength deficit noted in hip and knee flexor and extensor muscle groups.  Ankle flexion and  extension intact.  Skin: Skin is warm and dry.  Psychiatric: She has a normal mood and affect.  Nursing note and vitals reviewed.    ED Treatments / Results  Labs (all labs ordered are listed, but only abnormal results are displayed) Labs Reviewed  POC URINE PREG, ED    EKG  EKG Interpretation None       Radiology Dg Lumbar Spine Complete  Result Date: 11/06/2016 CLINICAL DATA:  Chronic low back pain.  Fall 4 days ago EXAM: [place] 4+ VIEW  COMPARISON:  None. FINDINGS: Normal alignment. Negative for fracture. Mild disc degeneration L2-3, L3-4, and L4-5 with mild spurring. Negative for pars defect. IMPRESSION: Mild lumbar degenerative change.  Negative for fracture. Electronically Signed   By: Franchot Gallo M.D.   On: 11/06/2016 13:38    Procedures Procedures (including critical care time)  Medications Ordered in ED Medications - No data to display   Initial Impression / Assessment and Plan / ED Course  I have reviewed the triage vital signs and the nursing notes.  Pertinent labs & imaging results that were available during my care of the patient were reviewed by me and considered in my medical decision making (see chart for details).     No neuro deficit on exam or by history to suggest emergent or surgical presentation.  discussed worsened sx that should prompt immediate re-evaluation including distal weakness, bowel/bladder retention/incontinence.  Suspect moderate degree of muscle spasm with this injury.  Flexeril, oxycodone, cautioned re drowsiness. Heat tx.  Planned f/u with her pcp for any persistent sx.  The patient appears reasonably screened and/or stabilized for discharge and I doubt any other medical condition or other [facility] requiring further screening, evaluation, or treatment in the ED at this time prior to discharge.   Final Clinical Impressions(s) / ED Diagnoses   Final diagnoses:  Strain of lumbar region, initial encounter    New  Prescriptions Discharge Medication List as of 11/06/2016  2:38 PM    START taking these medications   Details  cyclobenzaprine (FLEXERIL) 5 MG tablet Take 1 tablet (5 mg total) by mouth 3 (three) times daily as needed for muscle spasms., Starting Thu 11/06/2016, Print    oxyCODONE-acetaminophen (PERCOCET/ROXICET) 5-325 MG tablet Take 1 tablet by mouth every 4 (four) hours as needed., Starting Thu 11/06/2016, Print         Evalee Jefferson, PA-C 11/08/16 Manning, [place], DO 11/10/16 Vernelle Emerald

## 2017-01-06 ENCOUNTER — Encounter (HOSPITAL_COMMUNITY): Payer: Self-pay | Admitting: Emergency Medicine

## 2017-01-06 ENCOUNTER — Emergency Department (HOSPITAL_COMMUNITY)
Admission: EM | Admit: 2017-01-06 | Discharge: 2017-01-06 | Disposition: A | Discharge status: Home / Self Care | Payer: PRIVATE HEALTH INSURANCE | Attending: Emergency Medicine | Admitting: Emergency Medicine

## 2017-01-06 DIAGNOSIS — J019 Acute sinusitis, unspecified: Secondary | ICD-10-CM | POA: Insufficient documentation

## 2017-01-06 DIAGNOSIS — Z79899 Other long term (current) drug therapy: Secondary | ICD-10-CM | POA: Insufficient documentation

## 2017-01-06 DIAGNOSIS — R519 Headache, unspecified: Secondary | ICD-10-CM

## 2017-01-06 DIAGNOSIS — R51 Headache: Secondary | ICD-10-CM

## 2017-01-06 DIAGNOSIS — R0981 Nasal congestion: Secondary | ICD-10-CM | POA: Diagnosis present

## 2017-01-06 DIAGNOSIS — F17210 Nicotine dependence, cigarettes, uncomplicated: Secondary | ICD-10-CM | POA: Insufficient documentation

## 2017-01-06 HISTORY — DX: Diverticulosis of intestine, part unspecified, without perforation or abscess without bleeding: K57.90

## 2017-01-06 MED ORDER — LORATADINE 10 MG PO TABS: 10.0000 mg | ORAL_TABLET | Freq: Every day | ORAL | 0 refills | Status: DC

## 2017-01-06 MED ORDER — IBUPROFEN 600 MG PO TABS: 600.0000 mg | ORAL_TABLET | Freq: Four times a day (QID) | ORAL | 0 refills | Status: DC | PRN

## 2017-01-06 MED ORDER — IBUPROFEN 400 MG PO TABS
600.0000 mg | ORAL_TABLET | Freq: Once | ORAL | Status: AC
  Administered 2017-01-06: 600 mg via ORAL
  Filled 2017-01-06: qty 2

## 2017-01-06 MED ORDER — OXYMETAZOLINE HCL 0.05 % NA SOLN
2.0000 | Freq: Once | NASAL | Status: AC
  Administered 2017-01-06: 2 via NASAL
  Filled 2017-01-06: qty 15

## 2017-01-06 MED ORDER — LORATADINE 10 MG PO TABS
10.0000 mg | ORAL_TABLET | Freq: Once | ORAL | Status: AC
  Administered 2017-01-06: 10 mg via ORAL
  Filled 2017-01-06: qty 1

## 2017-01-06 NOTE — ED Provider Notes (Signed)
[facility] DEPT Provider Note   CSN: [id number] Arrival date & time: 01/06/17  0744     History   Chief Complaint Chief Complaint  Patient presents with  . Nasal Congestion    HPI Amy [name] is a 41 y.o. female.  HPI Patient presents with one day of nasal congestion, frontal sinus headache, postnasal drip and nonproductive cough. No fever or chills. No known exposures. No neck pain or stiffness. Past Medical History:  Diagnosis Date  . Acid reflux   . Diverticulosis   . HSV-2 (herpes simplex virus 2) infection   . Hx of chlamydia infection   . Nexplanon insertion 02/07/2014   Inserted left arm 02/07/14  . Nexplanon removal 02/07/2014  . Obesity   . Pain in right hip 08/09/2012  . Reflux 01/19/2014    Patient Active Problem List   Diagnosis Date Noted  . Achilles tendinitis, left leg 05/27/2016  . Achilles tendinitis, right leg 05/27/2016  . Diarrhea 07/15/2015  . Abdominal pain, chronic, right lower quadrant 08/15/2014  . Abdominal pain   . Nexplanon insertion 02/07/2014  . Nexplanon removal 02/07/2014  . Reflux 01/19/2014  . Heme positive stool 08/04/2013  . Pain in right hip 08/09/2012  . Hx of chlamydia infection 08/05/2012  . 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 Obesity 08/05/2012    Past Surgical History:  Procedure Laterality Date  . CESAREAN SECTION    . COLONOSCOPY N/A 09/01/2013   Procedure: COLONOSCOPY;  Surgeon: Rogene Houston, MD;  [facility];  Service: Endoscopy;  Laterality: N/A;  100    OB History    Gravida Para Term Preterm AB Living   1 1 1     1    SAB TAB Ectopic Multiple Live Births           1       Home Medications    Prior to Admission medications   Medication Sig Start Date End Date Taking? Authorizing Provider  albuterol (PROVENTIL HFA;VENTOLIN HFA) 108 (90 Base) MCG/ACT inhaler Inhale 2 puffs into the lungs every 6 (six) hours as needed for wheezing or shortness of breath. 07/08/15    Law, Bea Graff, PA-C  Cetirizine HCl (ZYRTEC ALLERGY) 10 MG CAPS Take 1 capsule (10 mg total) by mouth daily. 07/08/15   Law, Bea Graff, PA-C  cyclobenzaprine (FLEXERIL) 5 MG tablet Take 1 tablet (5 mg total) by mouth 3 (three) times daily as needed for muscle spasms. 11/06/16   Evalee Jefferson, PA-C  dicyclomine (BENTYL) 20 MG tablet Take 1 tablet (20 mg total) by mouth 2 (two) times daily. 10/23/16   Dorie Rank, MD  etonogestrel (IMPLANON) 68 MG IMPL implant Inject 1 each into the skin once.    [provider]  ibuprofen (ADVIL,MOTRIN) 600 MG tablet Take 1 tablet (600 mg total) by mouth every 6 (six) hours as needed. 01/06/17   Julianne Rice, MD  loratadine (CLARITIN) 10 MG tablet Take 1 tablet (10 mg total) by mouth daily. 01/06/17   Julianne Rice, MD  oxyCODONE-acetaminophen (PERCOCET/ROXICET) 5-325 MG tablet Take 1 tablet by mouth every 4 (four) hours as needed. 11/06/16   Idol, Almyra Free, PA-C  predniSONE (DELTASONE) 10 MG tablet Take 6 tablets day one, 5 tablets day two, 4 tablets day three, 3 tablets day four, 2 tablets day five, then 1 tablet day six 11/06/16   Idol, Almyra Free, PA-C  promethazine (PHENERGAN) 25 MG tablet Take 1 tablet (25 mg total) by mouth every 6 (six)  hours as needed for nausea or vomiting. 10/23/16   Dorie Rank, MD    Family History Family History  Problem Relation Age of Onset  . Hypertension Mother   . Diabetes Mother   . COPD Mother   . Hypertension Sister   . Cancer Maternal Grandfather   . Cancer Father     Social History Social History  Substance Use Topics  . Smoking status: Current Some Day Smoker    Packs/day: 0.50    Types: Cigarettes  . Smokeless tobacco: Never Used     Comment: on the weekends when she drinks  . Alcohol use Yes     Comment: on weekends     Allergies   Keflex [cephalexin]   Review of Systems Review of Systems  Constitutional: Negative for chills, fatigue and fever.  HENT: Positive for congestion, postnasal drip, sinus  pain and sinus pressure. Negative for ear pain, sore throat, trouble swallowing and voice change.   Respiratory: Positive for cough. Negative for shortness of breath and wheezing.   Cardiovascular: Negative for chest pain.  Gastrointestinal: Negative for abdominal pain, nausea and vomiting.  Musculoskeletal: Negative for back pain, myalgias and neck pain.  Skin: Negative for rash and wound.  Neurological: Positive for headaches. Negative for weakness and numbness.  All other systems reviewed and are negative.    Physical Exam Updated Vital Signs BP 134/77 (BP Location: Right Arm)   Pulse 94   Temp 98.1 F (36.7 C) (Oral)   Resp 18   Ht 5\' 4"  (1.626 m)   Wt (!) 167.8 kg (370 lb)   SpO2 98%   BMI 63.51 kg/m   Physical Exam  Constitutional: She is oriented to person, place, and time. She appears well-developed and well-nourished. No distress.  HENT:  Head: Normocephalic and atraumatic.  Mouth/Throat: Oropharynx is clear and moist.  Bilateral nasal mucosal edema. Bilateral Frontalsinus tenderness to percussion. Oropharynx is mildly erythematous. Uvula is midline. No tonsillar exudates.  Eyes: Pupils are equal, round, and reactive to light. EOM are normal.  Neck: Normal range of motion. Neck supple.  No meningismus. Anterior cervical adenopathy  Cardiovascular: Normal rate and regular rhythm.  Exam reveals no gallop and no friction rub.   No murmur heard. Pulmonary/Chest: Effort normal and breath sounds normal. No respiratory distress. She has no wheezes. She has no rales. She exhibits no tenderness.  Abdominal: Soft. Bowel sounds are normal. There is no tenderness. There is no rebound and no guarding.  Musculoskeletal: Normal range of motion. She exhibits no edema or tenderness.  Neurological: She is alert and oriented to person, place, and time.  Moving all extremities without focal deficit. Sensation fully intact.  Skin: Skin is warm and dry. No rash noted. She is not  diaphoretic. No erythema.  Psychiatric: She has a normal mood and affect. Her behavior is normal.  Nursing note and vitals reviewed.    ED Treatments / Results  Labs (all labs ordered are listed, but only abnormal results are displayed) Labs Reviewed - No data to display  EKG  EKG Interpretation None       Radiology No results found.  Procedures Procedures (including critical care time)  Medications Ordered in ED Medications  loratadine (CLARITIN) tablet 10 mg (not administered)  oxymetazoline (AFRIN) 0.05 % nasal spray 2 spray (not administered)  ibuprofen (ADVIL,MOTRIN) tablet 600 mg (not administered)     Initial Impression / Assessment and Plan / ED Course  I have reviewed the triage vital signs and  the nursing notes.  Pertinent labs & imaging results that were available during my care of the patient were reviewed by me and considered in my medical decision making (see chart for details).     Will treat for sinus disease, allergic versus infectious. Headache without red flag signs or symptoms. Return precautions given.  Final Clinical Impressions(s) / ED Diagnoses   Final diagnoses:  Sinus headache    New Prescriptions New Prescriptions   IBUPROFEN (ADVIL,MOTRIN) 600 MG TABLET    Take 1 tablet (600 mg total) by mouth every 6 (six) hours as needed.   LORATADINE (CLARITIN) 10 MG TABLET    Take 1 tablet (10 mg total) by mouth daily.     Julianne Rice, MD 01/06/17 0830

## 2017-01-06 NOTE — ED Triage Notes (Signed)
Onset last night, sore throat , cough with yellow sputum, headache, drainage going into chest.

## 2017-01-11 ENCOUNTER — Encounter (HOSPITAL_COMMUNITY): Payer: Self-pay

## 2017-01-11 ENCOUNTER — Emergency Department (HOSPITAL_COMMUNITY)
Admission: EM | Admit: 2017-01-11 | Discharge: 2017-01-11 | Disposition: A | Discharge status: Home / Self Care | Payer: PRIVATE HEALTH INSURANCE | Attending: Emergency Medicine | Admitting: Emergency Medicine

## 2017-01-11 DIAGNOSIS — Z79899 Other long term (current) drug therapy: Secondary | ICD-10-CM | POA: Diagnosis not present

## 2017-01-11 DIAGNOSIS — R51 Headache: Secondary | ICD-10-CM | POA: Diagnosis not present

## 2017-01-11 DIAGNOSIS — R05 Cough: Secondary | ICD-10-CM | POA: Diagnosis present

## 2017-01-11 DIAGNOSIS — R519 Headache, unspecified: Secondary | ICD-10-CM

## 2017-01-11 DIAGNOSIS — J018 Other acute sinusitis: Secondary | ICD-10-CM

## 2017-01-11 DIAGNOSIS — F17210 Nicotine dependence, cigarettes, uncomplicated: Secondary | ICD-10-CM | POA: Diagnosis not present

## 2017-01-11 MED ORDER — DEXAMETHASONE SODIUM PHOSPHATE 10 MG/ML IJ SOLN
10.0000 mg | Freq: Once | INTRAMUSCULAR | Status: AC
  Administered 2017-01-11: 10 mg via INTRAMUSCULAR
  Filled 2017-01-11: qty 1

## 2017-01-11 MED ORDER — DEXAMETHASONE 4 MG PO TABS: 4.0000 mg | ORAL_TABLET | Freq: Two times a day (BID) | ORAL | 0 refills | Status: DC

## 2017-01-11 MED ORDER — CLINDAMYCIN HCL 150 MG PO CAPS
300.0000 mg | ORAL_CAPSULE | Freq: Once | ORAL | Status: AC
  Administered 2017-01-11: 300 mg via ORAL
  Filled 2017-01-11: qty 2

## 2017-01-11 MED ORDER — PSEUDOEPHEDRINE HCL ER 120 MG PO TB12: 120.0000 mg | ORAL_TABLET | Freq: Every day | ORAL | 0 refills | Status: DC

## 2017-01-11 MED ORDER — CLINDAMYCIN HCL 150 MG PO CAPS: 150.0000 mg | ORAL_CAPSULE | Freq: Four times a day (QID) | ORAL | 0 refills | Status: DC

## 2017-01-11 MED ORDER — PSEUDOEPHEDRINE HCL 60 MG PO TABS
60.0000 mg | ORAL_TABLET | Freq: Once | ORAL | Status: AC
  Administered 2017-01-11: 60 mg via ORAL
  Filled 2017-01-11: qty 1

## 2017-01-11 NOTE — ED Provider Notes (Signed)
[facility] DEPT Provider Note   CSN: [id number] Arrival date & time: 01/11/17  1349     History   Chief Complaint Chief Complaint  Patient presents with  . Cough    HPI Amy [name] is a 41 y.o. female.   Pt is a63 y/o female who presents to the ED with c/o sinus congestion and sinus headache. Pt was seen in the ED on 9/18 for sinusitis and treated with claritin and afrin. Pt states the problem is getting worse. No fever, but some lightheadedness and increase cough. She c/o frontal headache. No blood in mucus, but mucus changing from clear to green. No measured fever, but some chills. Pt request recheck and management.      Past Medical History:  Diagnosis Date  . Acid reflux   . Diverticulosis   . HSV-2 (herpes simplex virus 2) infection   . Hx of chlamydia infection   . Nexplanon insertion 02/07/2014   Inserted left arm 02/07/14  . Nexplanon removal 02/07/2014  . Obesity   . Pain in right hip 08/09/2012  . Reflux 01/19/2014    Patient Active Problem List   Diagnosis Date Noted  . Achilles tendinitis, left leg 05/27/2016  . Achilles tendinitis, right leg 05/27/2016  . Diarrhea 07/15/2015  . Abdominal pain, chronic, right lower quadrant 08/15/2014  . Abdominal pain   . Nexplanon insertion 02/07/2014  . Nexplanon removal 02/07/2014  . Reflux 01/19/2014  . Heme positive stool 08/04/2013  . Pain in right hip 08/09/2012  . Hx of chlamydia infection 08/05/2012  . 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 Obesity 08/05/2012    Past Surgical History:  Procedure Laterality Date  . CESAREAN SECTION    . COLONOSCOPY N/A 09/01/2013   Procedure: COLONOSCOPY;  Surgeon: Rogene Houston, MD;  [facility];  Service: Endoscopy;  Laterality: N/A;  100    OB History    Gravida Para Term Preterm AB Living   1 1 1     1    SAB TAB Ectopic Multiple Live Births           1       Home Medications    Prior to Admission medications     Medication Sig Start Date End Date Taking? Authorizing Provider  albuterol (PROVENTIL HFA;VENTOLIN HFA) 108 (90 Base) MCG/ACT inhaler Inhale 2 puffs into the lungs every 6 (six) hours as needed for wheezing or shortness of breath. 07/08/15   Law, Bea Graff, PA-C  Cetirizine HCl (ZYRTEC ALLERGY) 10 MG CAPS Take 1 capsule (10 mg total) by mouth daily. 07/08/15   Law, Bea Graff, PA-C  clindamycin (CLEOCIN) 150 MG capsule Take 1 capsule (150 mg total) by mouth every 6 (six) hours. 01/11/17   Lily Kocher, PA-C  cyclobenzaprine (FLEXERIL) 5 MG tablet Take 1 tablet (5 mg total) by mouth 3 (three) times daily as needed for muscle spasms. 11/06/16   Evalee Jefferson, PA-C  dexamethasone (DECADRON) 4 MG tablet Take 1 tablet (4 mg total) by mouth 2 (two) times daily with a meal. 01/11/17   Lily Kocher, PA-C  dicyclomine (BENTYL) 20 MG tablet Take 1 tablet (20 mg total) by mouth 2 (two) times daily. 10/23/16   Dorie Rank, MD  etonogestrel (IMPLANON) 68 MG IMPL implant Inject 1 each into the skin once.    [provider]  ibuprofen (ADVIL,MOTRIN) 600 MG tablet Take 1 tablet (600 mg total) by mouth every 6 (six) hours as needed. 01/06/17  Julianne Rice, MD  loratadine (CLARITIN) 10 MG tablet Take 1 tablet (10 mg total) by mouth daily. 01/06/17   Julianne Rice, MD  oxyCODONE-acetaminophen (PERCOCET/ROXICET) 5-325 MG tablet Take 1 tablet by mouth every 4 (four) hours as needed. 11/06/16   Idol, Almyra Free, PA-C  predniSONE (DELTASONE) 10 MG tablet Take 6 tablets day one, 5 tablets day two, 4 tablets day three, 3 tablets day four, 2 tablets day five, then 1 tablet day six 11/06/16   Idol, Almyra Free, PA-C  promethazine (PHENERGAN) 25 MG tablet Take 1 tablet (25 mg total) by mouth every 6 (six) hours as needed for nausea or vomiting. 10/23/16   Dorie Rank, MD  pseudoephedrine (SUDAFED 12 HOUR) 120 MG 12 hr tablet Take 1 tablet (120 mg total) by mouth daily. 01/11/17   Lily Kocher, PA-C    Family History Family  History  Problem Relation Age of Onset  . Hypertension Mother   . Diabetes Mother   . COPD Mother   . Hypertension Sister   . Cancer Maternal Grandfather   . Cancer Father     Social History Social History  Substance Use Topics  . Smoking status: Current Some Day Smoker    Packs/day: 0.50    Types: Cigarettes  . Smokeless tobacco: Never Used     Comment: on the weekends when she drinks  . Alcohol use Yes     Comment: on weekends     Allergies   Keflex [cephalexin]   Review of Systems Review of Systems  Constitutional: Negative for activity change.       All ROS Neg except as noted in HPI  HENT: Positive for congestion, postnasal drip and sinus pressure. Negative for nosebleeds.   Eyes: Negative for photophobia and discharge.  Respiratory: Positive for cough. Negative for shortness of breath and wheezing.   Cardiovascular: Negative for chest pain and palpitations.  Gastrointestinal: Positive for nausea. Negative for abdominal pain and blood in stool.  Genitourinary: Negative for dysuria, frequency and hematuria.  Musculoskeletal: Negative for arthralgias, back pain and neck pain.  Skin: Negative.   Neurological: Positive for headaches. Negative for dizziness, seizures and speech difficulty.  Psychiatric/Behavioral: Negative for confusion and hallucinations.     Physical Exam Updated Vital Signs BP (!) 155/103   Pulse 99   Temp 98.4 F (36.9 C) (Oral)   Resp 18   Ht 5\' 4"  (1.626 m)   Wt (!) 167.8 kg (370 lb)   SpO2 98%   BMI 63.51 kg/m   Physical Exam  Constitutional: She is oriented to person, place, and time. She appears well-developed and well-nourished.  Non-toxic appearance.  HENT:  Head: Normocephalic.  Right Ear: Tympanic membrane and external ear normal.  Left Ear: Tympanic membrane and external ear normal.  Nasal congestion present. Minimal pain to percussion of the sinuses. Uvula enlarged with mod. Increase redness. Uvula midline. No swelling or  redness of the mastoid areas.  Eyes: Pupils are equal, round, and reactive to light. EOM and lids are normal.  Neck: Normal range of motion. Neck supple. Carotid bruit is not present. No tracheal deviation present.  Cardiovascular: Normal rate, regular rhythm, normal heart sounds, intact distal pulses and normal pulses.   Pulmonary/Chest: No stridor. No respiratory distress. She has rhonchi.  Abdominal: Soft. Bowel sounds are normal. There is no tenderness. There is no guarding.  Musculoskeletal: Normal range of motion.  Lymphadenopathy:       Head (right side): No submandibular adenopathy present.  Head (left side): No submandibular adenopathy present.    She has no cervical adenopathy.  Neurological: She is alert and oriented to person, place, and time. She has normal strength. No cranial nerve deficit or sensory deficit.  Skin: Skin is warm and dry.  Psychiatric: She has a normal mood and affect. Her speech is normal.  Nursing note and vitals reviewed.    ED Treatments / Results  Labs (all labs ordered are listed, but only abnormal results are displayed) Labs Reviewed - No data to display  EKG  EKG Interpretation None       Radiology No results found.  Procedures Procedures (including critical care time)  Medications Ordered in ED Medications  dexamethasone (DECADRON) injection 10 mg (10 mg Intramuscular Given 01/11/17 1616)  clindamycin (CLEOCIN) capsule 300 mg (300 mg Oral Given 01/11/17 1615)  pseudoephedrine (SUDAFED) tablet 60 mg (60 mg Oral Given 01/11/17 1615)     Initial Impression / Assessment and Plan / ED Course  I have reviewed the triage vital signs and the nursing notes.  Pertinent labs & imaging results that were available during my care of the patient were reviewed by me and considered in my medical decision making (see chart for details).      Final Clinical Impressions(s) / ED Diagnoses MDM Exam favors sinusitis and sinus headache. Pt has  fail previous outpatient management. Will try short course of steroids, clindamycin, and sudafed 12 hour. Pt to increase fluids and wash hands frequently. Pt in agreement with plan.   Final diagnoses:  Other acute sinusitis, recurrence not specified  Sinus headache    New Prescriptions New Prescriptions   CLINDAMYCIN (CLEOCIN) 150 MG CAPSULE    Take 1 capsule (150 mg total) by mouth every 6 (six) hours.   DEXAMETHASONE (DECADRON) 4 MG TABLET    Take 1 tablet (4 mg total) by mouth 2 (two) times daily with a meal.   PSEUDOEPHEDRINE (SUDAFED 12 HOUR) 120 MG 12 HR TABLET    Take 1 tablet (120 mg total) by mouth daily.     Lily Kocher, PA-C 01/11/17 1634    Fredia Sorrow, MD 01/14/17 [id number]

## 2017-01-11 NOTE — ED Notes (Signed)
ED Provider at bedside. 

## 2017-01-11 NOTE — Discharge Instructions (Signed)
Your vital signs are within normal limits. Your exam suggest sinusitis. Please use clindamycin and decadron daily. Use sudafed 12 hour for congestion and control cough. Please increase water and gatorade. Use tylenol and/or ibuprofen for fever or aching. Wash hands frequently.

## 2017-01-11 NOTE — ED Triage Notes (Signed)
Reports of cough, congestion and nasal drainage x1 week. States she was seen here and told if not feeling better to come back. Patient reports of not improving.

## 2017-02-02 ENCOUNTER — Encounter (HOSPITAL_COMMUNITY): Payer: Self-pay

## 2017-02-02 ENCOUNTER — Emergency Department (HOSPITAL_COMMUNITY): Payer: PRIVATE HEALTH INSURANCE

## 2017-02-02 ENCOUNTER — Emergency Department (HOSPITAL_COMMUNITY)
Admission: EM | Admit: 2017-02-02 | Discharge: 2017-02-02 | Disposition: A | Discharge status: Home / Self Care | Payer: PRIVATE HEALTH INSURANCE | Attending: Emergency Medicine | Admitting: Emergency Medicine

## 2017-02-02 DIAGNOSIS — R0602 Shortness of breath: Secondary | ICD-10-CM

## 2017-02-02 DIAGNOSIS — Z79899 Other long term (current) drug therapy: Secondary | ICD-10-CM | POA: Insufficient documentation

## 2017-02-02 DIAGNOSIS — J40 Bronchitis, not specified as acute or chronic: Secondary | ICD-10-CM | POA: Diagnosis not present

## 2017-02-02 DIAGNOSIS — F17210 Nicotine dependence, cigarettes, uncomplicated: Secondary | ICD-10-CM | POA: Diagnosis not present

## 2017-02-02 LAB — CBC
HEMATOCRIT: 42.7 % (ref 36.0–46.0)
HEMOGLOBIN: 14.1 g/dL (ref 12.0–15.0)
MCH: 31.5 pg (ref 26.0–34.0)
MCHC: 33 g/dL (ref 30.0–36.0)
MCV: 95.5 fL (ref 78.0–100.0)
Platelets: 275 10*3/uL (ref 150–400)
RBC: 4.47 MIL/uL (ref 3.87–5.11)
RDW: 13.7 % (ref 11.5–15.5)
WBC: 11.2 10*3/uL — ABNORMAL HIGH (ref 4.0–10.5)

## 2017-02-02 LAB — BASIC METABOLIC PANEL
ANION GAP: 7 (ref 5–15)
BUN: 6 mg/dL (ref 6–20)
CHLORIDE: 104 mmol/L (ref 101–111)
CO2: 28 mmol/L (ref 22–32)
Calcium: 8.4 mg/dL — ABNORMAL LOW (ref 8.9–10.3)
Creatinine, Ser: 0.9 mg/dL (ref 0.44–1.00)
GFR calc Af Amer: >60 mL/min (ref >60)
GLUCOSE: 104 mg/dL — AB (ref 65–99)
POTASSIUM: 3.7 mmol/L (ref 3.5–5.1)
Sodium: 139 mmol/L (ref 135–145)

## 2017-02-02 LAB — TROPONIN I: Troponin I: <0.03 ng/mL (ref <0.03)

## 2017-02-02 MED ORDER — DM-GUAIFENESIN ER 30-600 MG PO TB12: 1.0000 | ORAL_TABLET | Freq: Two times a day (BID) | ORAL | 1 refills | Status: DC

## 2017-02-02 MED ORDER — SODIUM CHLORIDE 0.9 % IV SOLN
INTRAVENOUS | Status: DC
  Administered 2017-02-02: 18:00:00 via INTRAVENOUS

## 2017-02-02 MED ORDER — IOPAMIDOL (ISOVUE-370) INJECTION 76%
100.0000 mL | Freq: Once | INTRAVENOUS | Status: AC | PRN
  Administered 2017-02-02: 100 mL via INTRAVENOUS

## 2017-02-02 NOTE — ED Notes (Signed)
Pt taken to ct 

## 2017-02-02 NOTE — Discharge Instructions (Signed)
On today's workup to include CT of chest without evidence of pneumonia or any blood clots in the lungs. Symptoms are probably related to an upper respiratory bronchitis. Continue to use her albuterol inhaler 2 puffs every 6 hours. And start taking the Mucinex DM but at least for the next 7 days.

## 2017-02-02 NOTE — ED Provider Notes (Signed)
[facility] EMERGENCY DEPARTMENT Provider Note   CSN: [id number] Arrival date & time: 02/02/17  1436     History   Chief Complaint Chief Complaint  Patient presents with  . Shortness of Breath  . Chest Pain    HPI Amy [name] is a 41 y.o. female.  Patient with complaint of cough bodyaches occasionally productive cough for one month. Patient been treated with antibiotics and prednisone without making any difference. What is new is she had acute and shortness of breath that occurred last evening.With some mild pain between her shoulder blades and in her chest. No fevers. Patient is on albuterol inhaler.This was preceded by a sinus infection.      Past Medical History:  Diagnosis Date  . Acid reflux   . Diverticulosis   . HSV-2 (herpes simplex virus 2) infection   . Hx of chlamydia infection   . Nexplanon insertion 02/07/2014   Inserted left arm 02/07/14  . Nexplanon removal 02/07/2014  . Obesity   . Pain in right hip 08/09/2012  . Reflux 01/19/2014    Patient Active Problem List   Diagnosis Date Noted  . Achilles tendinitis, left leg 05/27/2016  . Achilles tendinitis, right leg 05/27/2016  . Diarrhea 07/15/2015  . Abdominal pain, chronic, right lower quadrant 08/15/2014  . Abdominal pain   . Nexplanon insertion 02/07/2014  . Nexplanon removal 02/07/2014  . Reflux 01/19/2014  . Heme positive stool 08/04/2013  . Pain in right hip 08/09/2012  . Hx of chlamydia infection 08/05/2012  . 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 Obesity 08/05/2012    Past Surgical History:  Procedure Laterality Date  . CESAREAN SECTION    . COLONOSCOPY N/A 09/01/2013   Procedure: COLONOSCOPY;  Surgeon: Rogene Houston, MD;  Location: [facility];  Service: Endoscopy;  Laterality: N/A;  100    OB History    Gravida Para Term Preterm AB Living   1 1 1     1    SAB TAB Ectopic Multiple Live Births           1       Home Medications    Prior to Admission  medications   Medication Sig Start Date End Date Taking? Authorizing Provider  albuterol (PROVENTIL HFA;VENTOLIN HFA) 108 (90 Base) MCG/ACT inhaler Inhale 2 puffs into the lungs every 6 (six) hours as needed for wheezing or shortness of breath. 07/08/15  Yes Law, Bea Graff, PA-C  Cetirizine HCl (ZYRTEC ALLERGY) 10 MG CAPS Take 1 capsule (10 mg total) by mouth daily. 07/08/15  Yes Law, Bea Graff, PA-C  dicyclomine (BENTYL) 20 MG tablet Take 1 tablet (20 mg total) by mouth 2 (two) times daily. 10/23/16  Yes Dorie Rank, MD  etonogestrel (IMPLANON) 68 MG IMPL implant Inject 1 each into the skin once.   Yes [provider]  loratadine (CLARITIN) 10 MG tablet Take 1 tablet (10 mg total) by mouth daily. 01/06/17  Yes Julianne Rice, MD  clindamycin (CLEOCIN) 150 MG capsule Take 1 capsule (150 mg total) by mouth every 6 (six) hours. Patient not taking: Reported on 02/02/2017 01/11/17   Lily Kocher, PA-C  cyclobenzaprine (FLEXERIL) 5 MG tablet Take 1 tablet (5 mg total) by mouth 3 (three) times daily as needed for muscle spasms. Patient not taking: Reported on 02/02/2017 11/06/16   Evalee Jefferson, PA-C  dexamethasone (DECADRON) 4 MG tablet Take 1 tablet (4 mg total) by mouth 2 (two) times daily with a meal. Patient not  taking: Reported on 02/02/2017 01/11/17   Lily Kocher, PA-C  dextromethorphan-guaiFENesin [facility] DM) 30-600 MG 12hr tablet Take 1 tablet by mouth 2 (two) times daily. 02/02/17   Fredia Sorrow, MD  ibuprofen (ADVIL,MOTRIN) 600 MG tablet Take 1 tablet (600 mg total) by mouth every 6 (six) hours as needed. Patient not taking: Reported on 02/02/2017 01/06/17   Julianne Rice, MD  oxyCODONE-acetaminophen (PERCOCET/ROXICET) 5-325 MG tablet Take 1 tablet by mouth every 4 (four) hours as needed. Patient not taking: Reported on 02/02/2017 11/06/16   Evalee Jefferson, PA-C  predniSONE (DELTASONE) 10 MG tablet Take 6 tablets day one, 5 tablets day two, 4 tablets day three, 3 tablets day four,  2 tablets day five, then 1 tablet day six Patient not taking: Reported on 02/02/2017 11/06/16   Evalee Jefferson, PA-C  promethazine (PHENERGAN) 25 MG tablet Take 1 tablet (25 mg total) by mouth every 6 (six) hours as needed for nausea or vomiting. Patient not taking: Reported on 02/02/2017 10/23/16   Dorie Rank, MD  pseudoephedrine (SUDAFED 12 HOUR) 120 MG 12 hr tablet Take 1 tablet (120 mg total) by mouth daily. Patient not taking: Reported on 02/02/2017 01/11/17   Lily Kocher, PA-C    Family History Family History  Problem Relation Age of Onset  . Hypertension Mother   . Diabetes Mother   . COPD Mother   . Hypertension Sister   . Cancer Maternal Grandfather   . Cancer Father     Social History Social History  Substance Use Topics  . Smoking status: Current Some Day Smoker    Packs/day: 0.50    Types: Cigarettes  . Smokeless tobacco: Never Used     Comment: on the weekends when she drinks  . Alcohol use Yes     Comment: on weekends     Allergies   Keflex [cephalexin]   Review of Systems Review of Systems  Constitutional: Negative for diaphoresis and fever.  HENT: Negative for congestion.   Eyes: Negative for visual disturbance.  Respiratory: Positive for shortness of breath.   Cardiovascular: Positive for chest pain. Negative for palpitations.  Gastrointestinal: Negative for abdominal pain, diarrhea, nausea and vomiting.  Genitourinary: Negative for dysuria.  Musculoskeletal: Positive for back pain.  Skin: Negative for rash.  Neurological: Negative for headaches.  Hematological: Does not bruise/bleed easily.  Psychiatric/Behavioral: Negative for confusion.     Physical Exam Updated Vital Signs BP (!) 129/103   Pulse 88   Temp 98.7 F (37.1 C) (Oral)   Resp 16   Ht 1.626 m (5\' 4" )   Wt (!) 167.8 kg (370 lb)   SpO2 98%   BMI 63.51 kg/m   Physical Exam  Constitutional: She is oriented to person, place, and time. She appears well-developed and  well-nourished. No distress.  HENT:  Head: Normocephalic and atraumatic.  Mouth/Throat: Oropharynx is clear and moist.  Eyes: Pupils are equal, round, and reactive to light. Conjunctivae and EOM are normal.  Neck: Normal range of motion. Neck supple.  Cardiovascular: Normal rate, regular rhythm and normal heart sounds.   Pulmonary/Chest: Effort normal and breath sounds normal. No respiratory distress. She has no wheezes. She exhibits no tenderness.  Abdominal: Soft. Bowel sounds are normal. There is no tenderness.  Musculoskeletal: Normal range of motion.  Neurological: She is alert and oriented to person, place, and time. No cranial nerve deficit or sensory deficit. She exhibits normal muscle tone. Coordination normal.  Skin: Skin is warm.  Nursing note and vitals reviewed.  ED Treatments / Results  Labs (all labs ordered are listed, but only abnormal results are displayed) Labs Reviewed  BASIC METABOLIC PANEL - Abnormal; Notable for the following:       Result Value   Glucose, Bld 104 (*)    Calcium 8.4 (*)    All other components within normal limits  CBC - Abnormal; Notable for the following:    WBC 11.2 (*)    All other components within normal limits  TROPONIN I    EKG  EKG Interpretation None     ED ECG REPORT   Date: 02/02/2017  Rate: 95  Rhythm: normal sinus rhythm  QRS Axis: normal  Intervals: normal  ST/T Wave abnormalities: normal  Conduction Disutrbances:none  Narrative Interpretation:   Old EKG Reviewed: none available  I have personally reviewed the EKG tracing and agree with the computerized printout as noted.   Radiology Dg Chest 2 View  Result Date: 02/02/2017 CLINICAL DATA:  Cough EXAM: CHEST  2 VIEW COMPARISON:  10/23/2016 FINDINGS: Minimal atelectasis or scar at the lingula. No consolidation or effusion. Normal heart size. No pneumothorax. IMPRESSION: No active cardiopulmonary disease. Electronically Signed   By: Donavan Foil M.D.   On:  02/02/2017 15:36   Ct Angio Chest Pe W/cm &/or Wo Cm  Result Date: 02/02/2017 CLINICAL DATA:  Cough and body aches for 1 month. Pain between shoulder blades and in chest EXAM: CT ANGIOGRAPHY CHEST WITH CONTRAST TECHNIQUE: Multidetector CT imaging of the chest was performed using the standard protocol during bolus administration of intravenous contrast. Multiplanar CT image reconstructions and MIPs were obtained to evaluate the vascular anatomy. CONTRAST:  100 mL Isovue 370. COMPARISON:  None. Chest radiograph earlier today. CT chest 08/17/2014. Raises insert breast. FINDINGS: Cardiovascular: Marginally satisfactory opacification of the pulmonary arteries to the proximal segmental level. This in part is due to the patient's body habitus (370 pounds) as well as less than optimal bolus timing. No evidence of pulmonary embolism. Normal heart size. No pericardial effusion. No aortic dissection. Mediastinum/Nodes: No enlarged mediastinal, hilar, or axillary lymph nodes. Thyroid gland, trachea, and esophagus demonstrate no significant findings. Lungs/Pleura: Lungs are clear. No pleural effusion or pneumothorax. Upper Abdomen: No acute abnormality.  Hepatic steatosis. Musculoskeletal: No chest wall abnormality. No acute or significant osseous findings. Review of the MIP images confirms the above findings. IMPRESSION: Suboptimal examination due to a combination of large body habitus and bolus timing. No central or proximal segmental pulmonary embolus is observed. No visible consolidation or edema. Electronically Signed   By: Staci Righter M.D.   On: 02/02/2017 19:30    Procedures Procedures (including critical care time)  Medications Ordered in ED Medications  0.9 %  sodium chloride infusion ( Intravenous New Bag/Given 02/02/17 1755)  iopamidol (ISOVUE-370) 76 % injection 100 mL (100 mLs Intravenous Contrast Given 02/02/17 1852)     Initial Impression / Assessment and Plan / ED Course  I have reviewed the  triage vital signs and the nursing notes.  Pertinent labs & imaging results that were available during my care of the patient were reviewed by me and considered in my medical decision making (see chart for details).     I will get CT angiogram to evaluate the chest in more detail and to rule out pulmonary embolus. Patient with cough and some respiratory problems for several weeks. But just had acute shortness of breath that developed last evening.  Patient's workup for the back pain chest pain includes negative troponin. EKG  without any acute changes. CT and geode negative for PE or any other acute findings. No evidence of pneumonia either. Suspect symptoms are bronchitis in nature probably from a viral upper respiratory. Patient been on prednisone before without any help. We'll continue her albuterol inhaler will treat with Mucinex DM. Patient stable for discharge home.  Final Clinical Impressions(s) / ED Diagnoses   Final diagnoses:  Bronchitis  SOB (shortness of breath)    New Prescriptions New Prescriptions   DEXTROMETHORPHAN-GUAIFENESIN (MUCINEX DM) 30-600 MG 12HR TABLET    Take 1 tablet by mouth 2 (two) times daily.     Fredia Sorrow, MD 02/02/17 2231

## 2017-02-02 NOTE — ED Notes (Signed)
Gave EKG to Dr.Mesner 

## 2017-02-02 NOTE — ED Triage Notes (Addendum)
Reports of cough and body aches x1 month. States she has completed antibiotics in september and feeling no better. Patient also reports of pain between shoulder blades and in chest.

## 2017-07-08 ENCOUNTER — Encounter (INDEPENDENT_AMBULATORY_CARE_PROVIDER_SITE_OTHER): Payer: Self-pay

## 2017-09-10 ENCOUNTER — Ambulatory Visit: Payer: PRIVATE HEALTH INSURANCE | Admitting: Podiatry

## 2017-09-10 ENCOUNTER — Ambulatory Visit (INDEPENDENT_AMBULATORY_CARE_PROVIDER_SITE_OTHER): Payer: Medicaid Other

## 2017-09-10 DIAGNOSIS — M7661 Achilles tendinitis, right leg: Secondary | ICD-10-CM

## 2017-09-10 DIAGNOSIS — M79672 Pain in left foot: Principal | ICD-10-CM

## 2017-09-10 DIAGNOSIS — M79671 Pain in right foot: Secondary | ICD-10-CM

## 2017-09-10 DIAGNOSIS — M216X9 Other acquired deformities of unspecified foot: Secondary | ICD-10-CM

## 2017-09-10 DIAGNOSIS — M7662 Achilles tendinitis, left leg: Secondary | ICD-10-CM

## 2017-09-10 MED ORDER — MELOXICAM 15 MG PO TABS: 15.0000 mg | ORAL_TABLET | Freq: Every day | ORAL | 1 refills | Status: DC

## 2017-09-10 NOTE — Progress Notes (Signed)
Subjective:  Patient ID: Amy [name], female    DOB: 11-Aug-1975,  MRN: [id number]  Chief Complaint  Patient presents with  . Foot Pain    bilateral   42 y.o. female presents with the above complaint. Reports bilat heel pain to the back of both heels for several years. Has seen another provider for this issue and tried nitro patches. No improvement noted. Having sharp pain in the back of both heels that is getting unbearable.  Past Medical History:  Diagnosis Date  . Acid reflux   . Diverticulosis   . HSV-2 (herpes simplex virus 2) infection   . Hx of chlamydia infection   . Nexplanon insertion 02/07/2014   Inserted left arm 02/07/14  . Nexplanon removal 02/07/2014  . Obesity   . Pain in right hip 08/09/2012  . Reflux 01/19/2014   Past Surgical History:  Procedure Laterality Date  . CESAREAN SECTION    . COLONOSCOPY N/A 09/01/2013   Procedure: COLONOSCOPY;  Surgeon: Rogene Houston, MD;  Location: [facility];  Service: Endoscopy;  Laterality: N/A;  100    Current Outpatient Medications:  .  albuterol (PROVENTIL HFA;VENTOLIN HFA) 108 (90 Base) MCG/ACT inhaler, Inhale 2 puffs into the lungs every 6 (six) hours as needed for wheezing or shortness of breath., Disp: 1 Inhaler, Rfl: 2 .  Cetirizine HCl (ZYRTEC ALLERGY) 10 MG CAPS, Take 1 capsule (10 mg total) by mouth daily., Disp: 30 capsule, Rfl: 0 .  clindamycin (CLEOCIN) 150 MG capsule, Take 1 capsule (150 mg total) by mouth every 6 (six) hours. (Patient not taking: Reported on 02/02/2017), Disp: 28 capsule, Rfl: 0 .  cyclobenzaprine (FLEXERIL) 5 MG tablet, Take 1 tablet (5 mg total) by mouth 3 (three) times daily as needed for muscle spasms. (Patient not taking: Reported on 02/02/2017), Disp: 15 tablet, Rfl: 0 .  dexamethasone (DECADRON) 4 MG tablet, Take 1 tablet (4 mg total) by mouth 2 (two) times daily with a meal. (Patient not taking: Reported on 02/02/2017), Disp: 12 tablet, Rfl: 0 .  dextromethorphan-guaiFENesin  (MUCINEX DM) 30-600 MG 12hr tablet, Take 1 tablet by mouth 2 (two) times daily., Disp: 14 tablet, Rfl: 1 .  dicyclomine (BENTYL) 20 MG tablet, Take 1 tablet (20 mg total) by mouth 2 (two) times daily., Disp: 20 tablet, Rfl: 0 .  etonogestrel (IMPLANON) 68 MG IMPL implant, Inject 1 each into the skin once., Disp: , Rfl:  .  ibuprofen (ADVIL,MOTRIN) 600 MG tablet, Take 1 tablet (600 mg total) by mouth every 6 (six) hours as needed. (Patient not taking: Reported on 02/02/2017), Disp: 30 tablet, Rfl: 0 .  loratadine (CLARITIN) 10 MG tablet, Take 1 tablet (10 mg total) by mouth daily., Disp: 30 tablet, Rfl: 0 .  meloxicam (MOBIC) 15 MG tablet, Take 1 tablet (15 mg total) by mouth daily., Disp: 30 tablet, Rfl: 1 .  oxyCODONE-acetaminophen (PERCOCET/ROXICET) 5-325 MG tablet, Take 1 tablet by mouth every 4 (four) hours as needed. (Patient not taking: Reported on 02/02/2017), Disp: 20 tablet, Rfl: 0 .  predniSONE (DELTASONE) 10 MG tablet, Take 6 tablets day one, 5 tablets day two, 4 tablets day three, 3 tablets day four, 2 tablets day five, then 1 tablet day six (Patient not taking: Reported on 02/02/2017), Disp: 21 tablet, Rfl: 0 .  promethazine (PHENERGAN) 25 MG tablet, Take 1 tablet (25 mg total) by mouth every 6 (six) hours as needed for nausea or vomiting. (Patient not taking: Reported on 02/02/2017), Disp: 30 tablet, Rfl:  0 .  pseudoephedrine (SUDAFED 12 HOUR) 120 MG 12 hr tablet, Take 1 tablet (120 mg total) by mouth daily. (Patient not taking: Reported on 02/02/2017), Disp: 20 tablet, Rfl: 0  Allergies  Allergen Reactions  . Keflex [Cephalexin] Itching and Rash   Review of Systems: Negative except as noted in the HPI. Denies N/V/F/Ch. Objective:  There were no vitals filed for this visit. General AA&O x3. Normal mood and affect.  Vascular Dorsalis pedis and posterior tibial pulses  present 2+ bilaterally  Capillary refill normal to all digits. Pedal hair growth normal.  Neurologic Epicritic  sensation grossly present.  Dermatologic No open lesions. Interspaces clear of maceration. Nails well groomed and normal in appearance.  Orthopedic: MMT 5/5 in dorsiflexion, plantarflexion, inversion, and eversion. Decreased AJ ROM bilat Posterior heel pain and POP achilles tendon   Posterior spurring with haglund's deformity bilat Assessment & Plan:  Patient was evaluated and treated and all questions answered.  Achilles Tendonitis, bilat -XR reviewed as above. -Educated on stretching and icing of the affected limb. -Night splint dispensed. -Referral placed to physical therapy. -eRx for Meloxicam.  No follow-ups on file.

## 2017-09-10 NOTE — Patient Instructions (Signed)

## 2017-09-11 NOTE — Addendum Note (Signed)
Addended by: Harriett Sine D on: 09/11/2017 11:40 AM   Modules accepted: Orders

## 2017-09-17 ENCOUNTER — Telehealth (HOSPITAL_COMMUNITY): Payer: Self-pay | Admitting: General Practice

## 2017-09-17 NOTE — Telephone Encounter (Signed)
09/17/17  I called patient and asked if she could come in at 1:30 instead of 1:00 that was originally scheduled and she said that she could.

## 2017-09-22 ENCOUNTER — Ambulatory Visit (HOSPITAL_COMMUNITY): Payer: PRIVATE HEALTH INSURANCE

## 2017-09-22 ENCOUNTER — Encounter (HOSPITAL_COMMUNITY): Payer: Self-pay

## 2017-09-22 ENCOUNTER — Telehealth (HOSPITAL_COMMUNITY): Payer: Self-pay | Admitting: General Practice

## 2017-09-22 NOTE — Telephone Encounter (Signed)
09/22/17  pt left a message to cx because she said that the medicine she is on has her vomiting and having diarrhea and just feeling under the weather

## 2017-10-07 ENCOUNTER — Encounter (HOSPITAL_COMMUNITY): Payer: Self-pay

## 2017-10-07 ENCOUNTER — Ambulatory Visit (HOSPITAL_COMMUNITY): Payer: Medicaid Other | Attending: Podiatry

## 2017-10-07 ENCOUNTER — Other Ambulatory Visit: Payer: Self-pay

## 2017-10-07 DIAGNOSIS — R2689 Other abnormalities of gait and mobility: Secondary | ICD-10-CM | POA: Insufficient documentation

## 2017-10-07 DIAGNOSIS — M79672 Pain in left foot: Secondary | ICD-10-CM | POA: Insufficient documentation

## 2017-10-07 DIAGNOSIS — M79671 Pain in right foot: Secondary | ICD-10-CM | POA: Insufficient documentation

## 2017-10-07 NOTE — Therapy (Signed)
[place], [place], [address] Phone: [phone]   Fax:  [phone]  Physical Therapy Evaluation  Patient Details  Name: Amy [name] MRN: [id number] Date of Birth: [birth year] Referring Provider: Evelina Bucy, DPM   Encounter Date: 10/07/2017  PT End of Session - 10/07/17 0835    Visit Number  1    Number of Visits  4    Authorization Type  Medicaid [place] (3 units requested 10/04/17); will request 12 more    Authorization Time Period  10/07/2017 - 12/02/17    Authorization - Visit Number  0    Authorization - Number of Visits  3    PT Start Time  0818    PT Stop Time  0907    PT Time Calculation (min)  49 min    Activity Tolerance  Patient tolerated treatment well    Behavior During Therapy  [facility] for tasks assessed/performed       Past Medical History:  Diagnosis Date  . Acid reflux   . Diverticulosis   . HSV-2 (herpes simplex virus 2) infection   . Hx of chlamydia infection   . Nexplanon insertion 02/07/2014   Inserted left arm 02/07/14  . Nexplanon removal 02/07/2014  . Obesity   . Pain in right hip 08/09/2012  . Reflux 01/19/2014    Past Surgical History:  Procedure Laterality Date  . CESAREAN SECTION    . COLONOSCOPY N/A 09/01/2013   Procedure: COLONOSCOPY;  Surgeon: Rogene Houston, MD;  Location: [facility];  Service: Endoscopy;  Laterality: N/A;  100    There were no vitals filed for this visit.   Subjective Assessment - 10/07/17 0819    Subjective  Patient reports she has been having bil foot and ankle pain for about 3 years and it has progressively gotten worse. She reports until October 2018 she had been working as a Quarry manager full time but due to her foot pain has been unable to continue working. She has seen several doctors who have done x-rays and found that she has a bone spur on bil heels but they have told her she cannot have surgery at this time because her achilles tendon is too  tight. She has been wearing a boot at night on her left foot to help stretch the tendon. She has also tried nitroglycerin patches for pain, ibuprofen, flexoril, heel lifts, and gabapentin. She has been experiencing ankle/foot swelling bilaterally and is following up with Dr. Haskell Flirt at [facility] to investigate further causes of her swelling. She reports none of the medication helps with her pain and that she feels a burning sharp sensation in her feet. She reports her feet are hypersensitive to touch and at night she has to find cool spots in her sheets to help with her pain. She would like to be able to decrease her pain and return to work if able.    Pertinent History  Lt MCL/ACL injury in 2008 (non-surgical rehab)    Limitations  Standing;Walking;House hold activities    How long can you sit comfortably?  comfortable as long as feet are not touching anything (if feet on floor its painful)    How long can you stand comfortably?  5 minutes    How long can you walk comfortably?  30-45 minutes, then need to sit    Diagnostic tests  x-rays - heel spurs bil LE    Currently in Pain?  Yes  Pain Score  7     Pain Location  Foot    Pain Orientation  Left    Pain Descriptors / Indicators  Burning;Sharp    Pain Type  Chronic pain    Pain Onset  More than a month ago    Pain Frequency  Constant    Aggravating Factors   walking standing    Pain Relieving Factors  unsure/nothing    Multiple Pain Sites  Yes    Pain Score  4    Pain Location  Foot    Pain Orientation  Right    Pain Descriptors / Indicators  Sharp;Burning    Pain Type  Chronic pain    Pain Onset  More than a month ago    Pain Frequency  Constant    Aggravating Factors   walking, standing    Pain Relieving Factors  unsure/nothing         [facility] PT Assessment - 10/07/17 0001      Assessment   Medical Diagnosis  Bilateral Achilles Tendonitis    Referring Provider  Evelina Bucy, DPM    Onset Date/Surgical Date  --  approximately 3 years ago    Hand Dominance  Right    Next MD Visit  unsure    Prior Therapy  for Left knee ~ 2008 (MCL/ACL injury)      Precautions   Precautions  None    Precaution Comments  wearing boot at night to stretch Lt ankle into dorsiflexion      Restrictions   Weight Bearing Restrictions  No      Balance Screen   Has the patient fallen in the past 6 months  No    Has the patient had a decrease in activity level because of a fear of falling?   No decreased activity due to pain not falling    Is the patient reluctant to leave their home because of a fear of falling?   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      Cognition   Overall Cognitive Status  Within Functional Limits for tasks assessed      Observation/Other Assessments   Other Surveys   Other Surveys    Lower Extremity Functional Scale   22/80      Sensation   Additional Comments  patient is hypersensitive to touch on lower legs/ankles bilaterally      Posture/Postural Control   Posture/Postural Control  Postural limitations      ROM / Strength   AROM / PROM / Strength  AROM;Strength      AROM   AROM Assessment Site  Ankle    Right/Left Ankle  Right;Left    Right Ankle Dorsiflexion  -4    Right Ankle Plantar Flexion  60  Right Ankle Inversion  24    Right Ankle Eversion  29    Left Ankle Dorsiflexion  -10    Left Ankle Plantar Flexion  60    Left Ankle Inversion  22     Left Ankle Eversion  28      Strength   Strength Assessment Site  Hip;Knee;Ankle    Right Hip Flexion  4+/5    Right Hip ABduction  4+/5    Left Hip Flexion  4+/5    Left Hip ABduction  4+/5    Right/Left Knee  Right;Left    Right Knee Extension  4+/5    Left Knee Extension  4+/5    Right/Left Ankle  Right;Left    Right Ankle Dorsiflexion  4/5    Right Ankle Plantar Flexion  -- not able to test due to pain    Right Ankle Inversion  4+/5    Right Ankle Eversion  4+/5    Left Ankle Dorsiflexion  4/5    Left Ankle Plantar Flexion  -- not able to test due to pain    Left Ankle Inversion  4+/5    Left Ankle Eversion  4-/5      Flexibility   Soft Tissue Assessment /Muscle Length  yes gastroc/soleus length deficits bil      Palpation   Palpation comment  Tenderness to palpation at insertion of achilles at calcaneous and at myotendinous junction for Bil LE's      Special Tests    Special Tests  Ankle/Foot Special Tests;Foot Alignment    Other special tests  Observation: patient with pes cavus bil LE, greater on Lt versus Rt    Ankle/Foot Special Tests   Thompson's Test;Provocative Tinel's Test;Great Toe Extension Test    Foot Alignment  other;other2      Thompson's Test   Findings  Negative    Side  Right;Left      Great Toe Extension Test    Comments  Great toe extension limited signifiacntly bilaterally with windlass maneuver.       Provocative Tinel's test    Findings  Negative    Side  Right;Left      other    Comment  Talcrural joint mobility: hypomobile for AP glide with bil LE      other   Comments  Subtalar joint mobility: hypomobile for medial/lateral glide with bil LE         Objective measurements completed on examination: See above findings.    PT Education - 10/07/17 1018    Education Details  Educated on overall exam findings and on appropriate plan as well as need to request visits throught medicaid. Educated on initial HEP for ankle mobility.     Person(s) Educated  Patient    Methods  Explanation;Demonstration;Handout    Comprehension  Verbalized understanding;Returned demonstration       PT Short Term Goals - 10/07/17 1135      PT SHORT TERM GOAL #1   Title  Patient will be independent with HEP to progress ankle ROM and functional strengtha nd reduce pain for improved mobility and QOL.    Time  2    Period  Weeks    Status  New    Target Date  10/21/17      PT SHORT TERM GOAL #2   Title  Patient will improve limited ankle ROM by 8 degrees or more up to WNL's to demonstrate significant improvement in functional ROM for improve gait and mobility.  Time  4    Period  Weeks    Status  New    Target Date  11/04/17      PT SHORT TERM GOAL #3   Title  Patient will improve LEFS by 9 points to demonstrate significant improveemtn in self reported function indicating decreased limitations due to LE pain.    Time  4    Period  Weeks    Status  New      PT SHORT TERM GOAL #4   Title  Patient will improve LE strength by 1/2 grade for all limited groups and have no pain with resisted testing.    Time  4    Period  Weeks    Status  New        PT Long Term Goals - 10/07/17 1142      PT LONG TERM GOAL #1   Title  Patient will improve LEFS by 18 points to demonstrate significant improvement in self reported function indicating decreased limitations due to LE pain.    Time  8    Period  Weeks    Status  New    Target Date  12/02/17      PT LONG TERM GOAL #2   Title  Patient will be able to perform SLS on bil LE for 15 seconds to demonstrate improved balance and weight bearing on bil LE to improve gait and stair mobility.    Time  8    Period  Weeks    Status  New      PT LONG TERM GOAL #3   Title  Patient will report being able to stand for 20 minutes or greater and walk for 60 minutes or greater with no increase in pain to be able to prepare meals and work around her home independently and progress towards returning to  work.    Time  8    Period  Weeks    Status  New      PT LONG TERM GOAL #4   Title  Patient will demonstrate ankle AROM WNL's bilaterally and improved functional strength of 5/5 throughout bil LE.    Time  8    Period  Weeks    Status  New        Plan - 10/07/17 0836    Clinical Impression Statement  Ms. Scaife presents for initial physical therapy evaluation for bil ankle/foot pain. She presents with limited LE strength Lt>Rt, impaired Lt ankle ROM, pain in her heels with weight bearing, tenderness to palpation along the achilles insertion and myotendinous junction on bil LE's, hypomobility of subtalar, talocrural, and 1st MTP joint bil. Her [place] Silos test indicates her tendon is intact, however is painful, and tinel's testing is negative. Both achilles tendons are thickened bilaterally. Based on objective testing and observations Ms. Shrode presents with signs/symptoms of degenerative achilles tendonosis as well as limited forefoot mobility and possible plantar fasciitis. She will benefit from skilled PT interventions to address current impairments and progress functional strength/mobility to improve QOL, function, and reduce pain.     Clinical Presentation  Stable    Clinical Presentation due to:  LEFS, ROM, MMT< palpation, tinels, thompson, clinical judgement    Clinical Decision Making  Moderate    Rehab Potential  Fair    PT Frequency  2x / week    PT Duration  8 weeks    PT Treatment/Interventions  ADLs/Self Care Home Management;Aquatic Therapy;Electrical Stimulation;Gait training;Stair training;Functional mobility training;Therapeutic activities;Therapeutic exercise;Balance training;Neuromuscular  re-education;Patient/family education;Taping;Energy conservation;Passive range of motion;Manual techniques    PT Next Visit Plan  Review evaluation and goals. Discuss update on aquatic therpay if start date determined. Initiate seated strengthening and mobility exercise for Bil ankles (BAPS, 4 way  ankle, towel curnch) initiate manual therapy for great toe mobility and TCJ mobility. Initiate cross friction massage to stimulate tendon reorganization if tolerabel by patient.    PT Home Exercise Plan  Eval: ankle alphabet,     Consulted and Agree with Plan of Care  Patient       Patient will benefit from skilled therapeutic intervention in order to improve the following deficits and impairments:  Pain, Improper body mechanics, Abnormal gait, Increased fascial restricitons, Decreased activity tolerance, Decreased endurance, Decreased range of motion, Decreased strength, Obesity, Impaired flexibility, Difficulty walking, Decreased balance, Postural dysfunction, Increased muscle spasms, Decreased mobility, Hypomobility  Visit Diagnosis: Pain in left foot  Pain in right foot  Other abnormalities of gait and mobility     Problem List Patient Active Problem List   Diagnosis Date Noted  . Achilles tendinitis, left leg 05/27/2016  . Achilles tendinitis, right leg 05/27/2016  . Diarrhea 07/15/2015  . Abdominal pain, chronic, right lower quadrant 08/15/2014  . Abdominal pain   . Nexplanon insertion 02/07/2014  . Nexplanon removal 02/07/2014  . Reflux 01/19/2014  . Heme positive stool 08/04/2013  . Pain in right hip 08/09/2012  . Hx of chlamydia infection 08/05/2012  . 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 Obesity 08/05/2012    Kipp Brood, PT, DPT Physical Therapist with [facility]  10/07/2017 12:21 PM    [place] [facility] [address] [place], [place], [address] Phone: [phone]   Fax:  [phone]  Name: KENIA TEAGARDEN MRN: [id number] Date of Birth: Jan 23, 1976

## 2017-10-13 ENCOUNTER — Ambulatory Visit (HOSPITAL_COMMUNITY): Payer: Medicaid Other

## 2017-10-13 ENCOUNTER — Telehealth (HOSPITAL_COMMUNITY): Payer: Self-pay

## 2017-10-13 NOTE — Telephone Encounter (Signed)
She have a sick child at home

## 2017-10-14 ENCOUNTER — Encounter: Payer: Self-pay | Admitting: Obstetrics and Gynecology

## 2017-10-14 ENCOUNTER — Encounter: Payer: Medicaid Other | Admitting: Obstetrics and Gynecology

## 2017-10-14 ENCOUNTER — Telehealth: Payer: Self-pay | Admitting: Obstetrics and Gynecology

## 2017-10-14 NOTE — Telephone Encounter (Signed)
Called pt lmg for ret call to resched new GYN appt today. Pt did not show for appt.

## 2017-10-15 ENCOUNTER — Ambulatory Visit (HOSPITAL_COMMUNITY): Payer: Medicaid Other

## 2017-10-15 ENCOUNTER — Telehealth (HOSPITAL_COMMUNITY): Payer: Self-pay

## 2017-10-15 NOTE — Progress Notes (Signed)
Patient did not keep her GYN referral appointment for 10/14/2017.  Durene Romans MD Attending Center for [employer] farm manager)

## 2017-10-15 NOTE — Telephone Encounter (Signed)
No show, called and spoke to pt who stated she had 6 apts this week and one was rescheduled for today and forgot to call.  Reminded next apt date and time wiht contact info given.    [address], [place]; [facility] [phone]

## 2017-10-15 NOTE — Telephone Encounter (Signed)
I called Amy [name] to discuss her missed appointments this week and her upcoming appointment on Monday 10/19/17. It has been scheduled for aquatic therapy but as she has not been into the clinic for a land based session yet, she has not been provided the handouts and information needed to begin aquatic therapy. I asked her if she would be available for an appointment at 2:30 PM and asked that she please call our front office back at [phone]. I will call her back at a later time to offer a 4PM slot as well if I do not here back from her today.  Kipp Brood, PT, DPT Physical Therapist with [facility]  10/15/2017 1:16 PM

## 2017-10-16 ENCOUNTER — Telehealth (HOSPITAL_COMMUNITY): Payer: Self-pay

## 2017-10-16 ENCOUNTER — Ambulatory Visit: Payer: Medicaid Other | Admitting: Podiatry

## 2017-10-16 NOTE — Telephone Encounter (Signed)
I spoke with Amy [name] to inform her we had to move her appointment time to 4:00 pm on Monday July 1st as she previously was scheduled for an aquatic therapy appointment. As she was unable to attend her prior 2 appointments she did not receive the information she needed to participate in aquatic therapy and she needs to have a land based therapy session. [name] was agreeable to come in at 4:00PM on Monday 10/19/17.  Kipp Brood, PT, DPT Physical Therapist with [facility]  10/16/2017 2:28 PM

## 2017-10-19 ENCOUNTER — Ambulatory Visit (HOSPITAL_COMMUNITY): Payer: Medicaid Other | Attending: Podiatry | Admitting: Physical Therapy

## 2017-10-19 DIAGNOSIS — M79672 Pain in left foot: Secondary | ICD-10-CM

## 2017-10-19 DIAGNOSIS — M79671 Pain in right foot: Secondary | ICD-10-CM | POA: Insufficient documentation

## 2017-10-19 DIAGNOSIS — R2689 Other abnormalities of gait and mobility: Secondary | ICD-10-CM | POA: Diagnosis present

## 2017-10-19 NOTE — Therapy (Signed)
[place], [place], [address] Phone: [phone]   Fax:  [phone]  Physical Therapy Treatment  Patient Details  Name: Amy [name] MRN: [id number] Date of Birth: 1976/02/16 Referring Provider: Evelina Bucy, DPM   Encounter Date: 10/19/2017  PT End of Session - 10/19/17 1604    Visit Number  2    Number of Visits  4    Authorization Type  Medicaid [place] (3 units requested 10/04/17); will request 12 more    Authorization Time Period  10/07/2017 - 12/02/17    Authorization - Visit Number  2    Authorization - Number of Visits  3    PT Start Time  1779    PT Stop Time  1635    PT Time Calculation (min)  45 min    Activity Tolerance  Patient tolerated treatment well    Behavior During Therapy  [facility] for tasks assessed/performed       Past Medical History:  Diagnosis Date  . Acid reflux   . Diverticulosis   . HSV-2 (herpes simplex virus 2) infection   . Hx of chlamydia infection   . Nexplanon insertion 02/07/2014   Inserted left arm 02/07/14  . Nexplanon removal 02/07/2014  . Obesity   . Pain in right hip 08/09/2012  . Reflux 01/19/2014    Past Surgical History:  Procedure Laterality Date  . CESAREAN SECTION    . COLONOSCOPY N/A 09/01/2013   Procedure: COLONOSCOPY;  Surgeon: Rogene Houston, MD;  Location[facility];  Service: Endoscopy;  Laterality: N/A;  100    There were no vitals filed for this visit.  Subjective Assessment - 10/19/17 1554    Subjective  Pt states her feet/toes are still burning at night when she's in the bed.  States she's on gabapentin and has spurs.  States the doctors are hoping the PT will help.   Currently 8/10 Lt, 6/10 Rt around achilles area bilaterally.    Currently in Pain?  Yes    Pain Score  8     Pain Location  Ankle    Pain Orientation  Left    Pain Descriptors / Indicators  Burning;Sharp    Pain Type  Chronic pain    Multiple Pain Sites  Yes    Pain Score  6     Pain Location  Ankle    Pain Orientation  Right    Pain Descriptors / Indicators  Burning    Pain Type  Chronic pain    Pain Radiating Towards  achilles                       [facility] Adult PT Treatment/Exercise - 10/19/17 0001      Manual Therapy   Manual Therapy  Soft tissue mobilization;Joint mobilization    Manual therapy comments  completed seperately from all other skilled interventions in prone to Lt only    Joint Mobilization  great toe, foot/ankle mobility    Soft tissue mobilization  LT PF into gastroc/soleus      Ankle Exercises: Stretches   Plantar Fascia Stretch  3 reps;30 seconds    Slant Board Stretch  3 reps;30 seconds      Ankle Exercises: Standing   Heel Raises  Both;10 reps    Toe Raise  10 reps      Ankle Exercises: Seated   Towel Crunch  Limitations    Towel Crunch Limitations  bilateral toe crunch 1 minute each    BAPS  Sitting;Level 3;10 reps Rt and Lt             PT Education - 10/19/17 1606    Education Details  reviewed evaluation including goals and HEP issued.  Given copy of evaluation and reveiwed aquatic therapy paperwork and got signatures.      Person(s) Educated  Patient    Methods  Explanation;Handout;Demonstration;Tactile cues;Verbal cues    Comprehension  Verbalized understanding;Returned demonstration       PT Short Term Goals - 10/07/17 1135      PT SHORT TERM GOAL #1   Title  Patient will be independent with HEP to progress ankle ROM and functional strengtha nd reduce pain for improved mobility and QOL.    Time  2    Period  Weeks    Status  New    Target Date  10/21/17      PT SHORT TERM GOAL #2   Title  Patient will improve limited ankle ROM by 8 degrees or more up to WNL's to demonstrate significant improvement in functional ROM for improve gait and mobility.    Time  4    Period  Weeks    Status  New    Target Date  11/04/17      PT SHORT TERM GOAL #3   Title  Patient will improve LEFS by 9 points  to demonstrate significant improveemtn in self reported function indicating decreased limitations due to LE pain.    Time  4    Period  Weeks    Status  New      PT SHORT TERM GOAL #4   Title  Patient will improve LE strength by 1/2 grade for all limited groups and have no pain with resisted testing.    Time  4    Period  Weeks    Status  New        PT Long Term Goals - 10/07/17 1142      PT LONG TERM GOAL #1   Title  Patient will improve LEFS by 18 points to demonstrate significant improvement in self reported function indicating decreased limitations due to LE pain.    Time  8    Period  Weeks    Status  New    Target Date  12/02/17      PT LONG TERM GOAL #2   Title  Patient will be able to perform SLS on bil LE for 15 seconds to demonstrate improved balance and weight bearing on bil LE to improve gait and stair mobility.    Time  8    Period  Weeks    Status  New      PT LONG TERM GOAL #3   Title  Patient will report being able to stand for 20 minutes or greater and walk for 60 minutes or greater with no increase in pain to be able to prepare meals and work around her home independently and progress towards returning to work.    Time  8    Period  Weeks    Status  New      PT LONG TERM GOAL #4   Title  Patient will demonstrate ankle AROM WNL's bilaterally and improved functional strength of 5/5 throughout bil LE.    Time  8    Period  Weeks    Status  New            Plan -  10/19/17 1649    Clinical Impression Statement  [name] returns after evaluation X 1 week ago.  Reveiwed goals per land based evaluation and given copy of pool rules.  Evaluating therapist spoke to pateint regarding intent to complete aquatic therapy in addition to land based and pateint reported interest in doing this.  Reviewed HEP and began standing stretches and seated ROM therex.  Manual began to Lt foot/ankle only wtih noted tightness throughout complex.  PT able to withstand manual and  reported overall improvement at end of session.      Rehab Potential  Fair    PT Frequency  2x / week    PT Duration  8 weeks    PT Treatment/Interventions  ADLs/Self Care Home Management;Aquatic Therapy;Electrical Stimulation;Gait training;Stair training;Functional mobility training;Therapeutic activities;Therapeutic exercise;Balance training;Neuromuscular re-education;Patient/family education;Taping;Energy conservation;Passive range of motion;Manual techniques    PT Next Visit Plan  continue with land based therapy and add aquatic in week after next.  PRogress seated strengthening and mobility exercise for bilateral ankles and continue manual to ankle/foot complex.      PT Home Exercise Plan  Eval: ankle alphabet,     Consulted and Agree with Plan of Care  Patient       Patient will benefit from skilled therapeutic intervention in order to improve the following deficits and impairments:  Pain, Improper body mechanics, Abnormal gait, Increased fascial restricitons, Decreased activity tolerance, Decreased endurance, Decreased range of motion, Decreased strength, Obesity, Impaired flexibility, Difficulty walking, Decreased balance, Postural dysfunction, Increased muscle spasms, Decreased mobility, Hypomobility  Visit Diagnosis: Pain in left foot  Pain in right foot  Other abnormalities of gait and mobility     Problem List Patient Active Problem List   Diagnosis Date Noted  . Achilles tendinitis, left leg 05/27/2016  . Achilles tendinitis, right leg 05/27/2016  . Diarrhea 07/15/2015  . Abdominal pain, chronic, right lower quadrant 08/15/2014  . Abdominal pain   . Nexplanon insertion 02/07/2014  . Nexplanon removal 02/07/2014  . Reflux 01/19/2014  . Heme positive stool 08/04/2013  . Pain in right hip 08/09/2012  . Hx of chlamydia infection 08/05/2012  . 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 Obesity 08/05/2012   Teena Irani, PTA/CLT [phone]  Teena Irani 10/19/2017, 4:55 PM  [facility] [address] [place], [place], [address] Phone: [phone]   Fax:  [phone]  Name: VERITY GILCREST MRN: [id number] Date of Birth: [birth year]

## 2017-10-20 ENCOUNTER — Encounter (HOSPITAL_COMMUNITY): Payer: Medicaid Other

## 2017-10-21 ENCOUNTER — Ambulatory Visit (HOSPITAL_COMMUNITY): Payer: Medicaid Other | Admitting: Physical Therapy

## 2017-10-21 ENCOUNTER — Encounter (HOSPITAL_COMMUNITY): Payer: Self-pay | Admitting: Physical Therapy

## 2017-10-21 DIAGNOSIS — M79672 Pain in left foot: Secondary | ICD-10-CM

## 2017-10-21 DIAGNOSIS — R2689 Other abnormalities of gait and mobility: Secondary | ICD-10-CM

## 2017-10-21 DIAGNOSIS — M79671 Pain in right foot: Secondary | ICD-10-CM

## 2017-10-21 NOTE — Therapy (Addendum)
[place], [place], [address] Phone: [phone]   Fax:  [phone]  Physical Therapy Treatment  Patient Details  Name: Amy [name] MRN: [id number] Date of Birth: Jan 13, 1976 Referring Provider: Evelina Bucy, DPM   Encounter Date: 10/21/2017  PT End of Session - 10/21/17 1205    Visit Number  3    Number of Visits  4    Authorization Type  Medicaid [place] (3 units requested 10/04/17); will request 12 more    Authorization Time Period  10/07/2017 - 12/02/17    Authorization - Visit Number  2    Authorization - Number of Visits  3    PT Start Time  3295    PT Stop Time  1208    PT Time Calculation (min)  40 min    Activity Tolerance  Patient tolerated treatment well    Behavior During Therapy  [facility] for tasks assessed/performed       Past Medical History:  Diagnosis Date  . Acid reflux   . Diverticulosis   . HSV-2 (herpes simplex virus 2) infection   . Hx of chlamydia infection   . Nexplanon insertion 02/07/2014   Inserted left arm 02/07/14  . Nexplanon removal 02/07/2014  . Obesity   . Pain in right hip 08/09/2012  . Reflux 01/19/2014    Past Surgical History:  Procedure Laterality Date  . CESAREAN SECTION    . COLONOSCOPY N/A 09/01/2013   Procedure: COLONOSCOPY;  Surgeon: Rogene Houston, MD;  [facility];  Service: Endoscopy;  Laterality: N/A;  100    There were no vitals filed for this visit.  Subjective Assessment - 10/21/17 1127    Subjective  Pt states that she is doing well with her HEP.  She was able to be up on her feet longer.     Pertinent History  Lt MCL/ACL injury in 2008 (non-surgical rehab)    Limitations  Standing;Walking;House hold activities    How long can you sit comfortably?  comfortable as long as feet are not touching anything (if feet on floor its painful)    How long can you stand comfortably?  5 minutes    How long can you walk comfortably?  30-45 minutes, then  need to sit    Diagnostic tests  x-rays - heel spurs bil LE    Pain Score  8     Pain Location  Ankle    Pain Orientation  Right;Left    Pain Descriptors / Indicators  Burning    Pain Type  Chronic pain    Pain Onset  More than a month ago    Pain Onset  More than a month ago                       [facility] Adult PT Treatment/Exercise - 10/21/17 0001      Exercises   Exercises  Ankle      Ankle Exercises: Stretches   Plantar Fascia Stretch  3 reps;30 seconds    Gastroc Stretch  2 reps;30 seconds    Slant Board Stretch  3 reps;30 seconds      Ankle Exercises: Standing   SLS  x5  B; LT 7"; Rt 4"     Heel Raises  15 reps    Toe Raise  15 reps    Other Standing Ankle Exercises  lunging x 10 B       Ankle  Exercises: Seated   Ankle Circles/Pumps  Both;10 reps    BAPS  Sitting;Level 3;10 reps Rt and Lt 15 reps; 12-6; 3-9; clockwise/counterclockwise.               PT Short Term Goals - 10/21/17 1212      PT SHORT TERM GOAL #1   Title  Patient will be independent with HEP to progress ankle ROM and functional strengtha nd reduce pain for improved mobility and QOL.    Time  2    Period  Weeks    Status  Achieved      PT SHORT TERM GOAL #2   Title  Patient will improve limited ankle ROM by 8 degrees or more up to WNL's to demonstrate significant improvement in functional ROM for improve gait and mobility.    Time  4    Period  Weeks    Status  On-going      PT SHORT TERM GOAL #3   Title  Patient will improve LEFS by 9 points to demonstrate significant improveemtn in self reported function indicating decreased limitations due to LE pain.    Time  4    Period  Weeks    Status  On-going      PT SHORT TERM GOAL #4   Title  Patient will improve LE strength by 1/2 grade for all limited groups and have no pain with resisted testing.    Time  4    Period  Weeks    Status  On-going        PT Long Term Goals - 10/21/17 1212      PT LONG TERM GOAL #1    Title  Patient will improve LEFS by 18 points to demonstrate significant improvement in self reported function indicating decreased limitations due to LE pain.    Time  8    Period  Weeks    Status  On-going      PT LONG TERM GOAL #2   Title  Patient will be able to perform SLS on bil LE for 15 seconds to demonstrate improved balance and weight bearing on bil LE to improve gait and stair mobility.    Time  8    Period  Weeks    Status  On-going      PT LONG TERM GOAL #3   Title  Patient will report being able to stand for 20 minutes or greater and walk for 60 minutes or greater with no increase in pain to be able to prepare meals and work around her home independently and progress towards returning to work.    Time  8    Period  Weeks    Status  On-going      PT LONG TERM GOAL #4   Title  Patient will demonstrate ankle AROM WNL's bilaterally and improved functional strength of 5/5 throughout bil LE.    Time  8    Period  Weeks    Status  On-going            Plan - 10/21/17 1207    Clinical Impression Statement  Added lunges, Single leg stance and plantar fascia stretch to program this session with pt showing good technique.  Manual not completed due to time restraints.     Rehab Potential  Fair    PT Frequency  2x / week    PT Duration  8 weeks    PT Treatment/Interventions  ADLs/Self Care Home Management;Aquatic Therapy;Electrical Stimulation;Gait training;Stair  training;Functional mobility training;Therapeutic activities;Therapeutic exercise;Balance training;Neuromuscular re-education;Patient/family education;Taping;Energy conservation;Passive range of motion;Manual techniques    PT Next Visit Plan  continue with land based therapy and add aquatic in week after next. Complete Baps standing.   PRogress seated strengthening and mobility exercise for bilateral ankles and continue manual to ankle/foot complex.      PT Home Exercise Plan  Eval: ankle alphabet,     Consulted and  Agree with Plan of Care  Patient       Patient will benefit from skilled therapeutic intervention in order to improve the following deficits and impairments:  Pain, Improper body mechanics, Abnormal gait, Increased fascial restricitons, Decreased activity tolerance, Decreased endurance, Decreased range of motion, Decreased strength, Obesity, Impaired flexibility, Difficulty walking, Decreased balance, Postural dysfunction, Increased muscle spasms, Decreased mobility, Hypomobility  Visit Diagnosis: Pain in left foot  Pain in right foot  Other abnormalities of gait and mobility     Problem List Patient Active Problem List   Diagnosis Date Noted  . Achilles tendinitis, left leg 05/27/2016  . Achilles tendinitis, right leg 05/27/2016  . Diarrhea 07/15/2015  . Abdominal pain, chronic, right lower quadrant 08/15/2014  . Abdominal pain   . Nexplanon insertion 02/07/2014  . Nexplanon removal 02/07/2014  . Reflux 01/19/2014  . Heme positive stool 08/04/2013  . Pain in right hip 08/09/2012  . Hx of chlamydia infection 08/05/2012  . 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 Obesity 08/05/2012    Rayetta Humphrey, PT CLT [phone] 10/21/2017, 12:14 PM  [facility] [address] [place], [place], [address] Phone: [phone]   Fax:  [phone]  Name: Amy [name] MRN: [id number] Date of Birth: 1975/06/14

## 2017-10-26 ENCOUNTER — Ambulatory Visit: Payer: Medicaid Other | Admitting: Podiatry

## 2017-10-26 DIAGNOSIS — M7661 Achilles tendinitis, right leg: Secondary | ICD-10-CM | POA: Diagnosis not present

## 2017-10-26 DIAGNOSIS — M7662 Achilles tendinitis, left leg: Secondary | ICD-10-CM | POA: Diagnosis not present

## 2017-10-26 MED ORDER — GABAPENTIN 300 MG PO CAPS: 300.0000 mg | ORAL_CAPSULE | Freq: Three times a day (TID) | ORAL | 3 refills | Status: DC

## 2017-10-26 MED ORDER — DICLOFENAC SODIUM 75 MG PO TBEC: 75.0000 mg | DELAYED_RELEASE_TABLET | Freq: Two times a day (BID) | ORAL | 0 refills | Status: DC

## 2017-10-27 ENCOUNTER — Telehealth (HOSPITAL_COMMUNITY): Payer: Self-pay

## 2017-10-27 ENCOUNTER — Ambulatory Visit (HOSPITAL_COMMUNITY): Payer: Medicaid Other

## 2017-10-27 NOTE — Telephone Encounter (Signed)
We are still waiting on certification for rehab visits

## 2017-10-29 ENCOUNTER — Telehealth (HOSPITAL_COMMUNITY): Payer: Self-pay

## 2017-10-29 ENCOUNTER — Ambulatory Visit (HOSPITAL_COMMUNITY): Payer: Medicaid Other

## 2017-10-29 NOTE — Telephone Encounter (Signed)
Called and spoke to pt about Medicaid not approved authorization for today's visit.  Reminded pt of next apt for aquatic therapy on Monday.  [address], [place]; [place] [phone]

## 2017-11-02 ENCOUNTER — Ambulatory Visit (HOSPITAL_COMMUNITY): Payer: Medicaid Other

## 2017-11-02 ENCOUNTER — Telehealth (HOSPITAL_COMMUNITY): Payer: Self-pay

## 2017-11-02 NOTE — Telephone Encounter (Signed)
I called Amy [name] and left a voicemail to inform her that her medicaid has not been approved yet and we need to cancel today's appointment for this reason. I informed her that her next appointment is for Thursday 7/18 at 11:15 AM and that we will keep this appointment scheduled. I told her that if she is no approved on Thursday we will call and let her know but that if she does not here from [place] that means she has been approved and is able to come into her appointment. I asked that if she has any questions about today's cancellation to call our front office at [phone].  Kipp Brood, PT, DPT Physical Therapist with [facility]  11/02/2017 11:14 AM

## 2017-11-03 ENCOUNTER — Encounter (HOSPITAL_COMMUNITY): Payer: Medicaid Other

## 2017-11-05 ENCOUNTER — Ambulatory Visit (HOSPITAL_COMMUNITY): Payer: Medicaid Other

## 2017-11-05 ENCOUNTER — Telehealth (HOSPITAL_COMMUNITY): Payer: Self-pay

## 2017-11-05 NOTE — Telephone Encounter (Signed)
I called Amy [name] to inform her she still has not been approved for her therapy session today and that we need to cancel today's appointment. I told her that we will check tomorrow morning and if she has been approved and we have openings I will put her on the wait list and call if we can fit her in. I also told her we will call on Monday 11/09/17, the date of her next appointment, to let her know if she has or has not been approved. He appointment on Monday is for Aquatic therapy at 1:00PM. She stated her understanding.  Kipp Brood, PT, DPT Physical Therapist with [facility]  11/05/2017 10:19 AM

## 2017-11-09 ENCOUNTER — Ambulatory Visit (HOSPITAL_COMMUNITY): Payer: Medicaid Other

## 2017-11-09 ENCOUNTER — Telehealth (HOSPITAL_COMMUNITY): Payer: Self-pay

## 2017-11-09 NOTE — Telephone Encounter (Signed)
I called and spoke with Amy [name] to inform her that her medicaid authorization has been approved and she can come in for her 1:00PM appointment for aquatic therapy. She stated that she is unable to make this appointment because she has to take her son the the doctor's but would like to try to be rescheduled for tomorrow if possible. She asked that we call her this afternoon to reschedule if possible.   Kipp Brood, PT, DPT Physical Therapist with [facility]  11/09/2017 12:15 PM

## 2017-11-10 ENCOUNTER — Telehealth (HOSPITAL_COMMUNITY): Payer: Self-pay

## 2017-11-10 ENCOUNTER — Encounter (HOSPITAL_COMMUNITY): Payer: Medicaid Other

## 2017-11-10 NOTE — Telephone Encounter (Signed)
A message was left offering the patient a 9:45 appt for today I'm waiting for her to call us back.

## 2017-11-11 NOTE — Progress Notes (Signed)
Subjective:  Patient ID: Amy [name], female    DOB: [birth year],  MRN: [id number]  Chief Complaint  Patient presents with  . Foot Pain    F/U B/L tendonitis Pt. stated," swelling and pain (9/10 sharp constant pain ) are about the same, nothing is helping." Tx: meloxicam, night splint, PT, stretching, and icing -Pt . had ultrasound and they did not find any blood clots   42 y.o. female presents with the above complaint.  Reports swelling and pain it is unsure about the same.  Nothing is helping.  Numbness is about the same.  Tried meloxicam night splint PT stretching and icing.  3 weeks ago helps on the left leg did not find any blood clots.  Past Medical History:  Diagnosis Date  . Acid reflux   . Diverticulosis   . HSV-2 (herpes simplex virus 2) infection   . Hx of chlamydia infection   . Nexplanon insertion 02/07/2014   Inserted left arm 02/07/14  . Nexplanon removal 02/07/2014  . Obesity   . Pain in right hip 08/09/2012  . Reflux 01/19/2014   Past Surgical History:  Procedure Laterality Date  . CESAREAN SECTION    . COLONOSCOPY N/A 09/01/2013   Procedure: COLONOSCOPY;  Surgeon: Rogene Houston, MD;  [facility];  Service: Endoscopy;  Laterality: N/A;  100    Current Outpatient Medications:  .  albuterol (PROVENTIL HFA;VENTOLIN HFA) 108 (90 Base) MCG/ACT inhaler, Inhale 2 puffs into the lungs every 6 (six) hours as needed for wheezing or shortness of breath., Disp: 1 Inhaler, Rfl: 2 .  Cetirizine HCl (ZYRTEC ALLERGY) 10 MG CAPS, Take 1 capsule (10 mg total) by mouth daily., Disp: 30 capsule, Rfl: 0 .  clindamycin (CLEOCIN) 150 MG capsule, Take 1 capsule (150 mg total) by mouth every 6 (six) hours., Disp: 28 capsule, Rfl: 0 .  cyclobenzaprine (FLEXERIL) 5 MG tablet, Take 1 tablet (5 mg total) by mouth 3 (three) times daily as needed for muscle spasms., Disp: 15 tablet, Rfl: 0 .  dexamethasone (DECADRON) 4 MG tablet, Take 1 tablet (4 mg total) by mouth 2 (two)  times daily with a meal., Disp: 12 tablet, Rfl: 0 .  dextromethorphan-guaiFENesin (MUCINEX DM) 30-600 MG 12hr tablet, Take 1 tablet by mouth 2 (two) times daily., Disp: 14 tablet, Rfl: 1 .  dicyclomine (BENTYL) 20 MG tablet, Take 1 tablet (20 mg total) by mouth 2 (two) times daily., Disp: 20 tablet, Rfl: 0 .  etonogestrel (IMPLANON) 68 MG IMPL implant, Inject 1 each into the skin once., Disp: , Rfl:  .  ibuprofen (ADVIL,MOTRIN) 600 MG tablet, Take 1 tablet (600 mg total) by mouth every 6 (six) hours as needed., Disp: 30 tablet, Rfl: 0 .  loratadine (CLARITIN) 10 MG tablet, Take 1 tablet (10 mg total) by mouth daily., Disp: 30 tablet, Rfl: 0 .  oxyCODONE-acetaminophen (PERCOCET/ROXICET) 5-325 MG tablet, Take 1 tablet by mouth every 4 (four) hours as needed., Disp: 20 tablet, Rfl: 0 .  predniSONE (DELTASONE) 10 MG tablet, Take 6 tablets day one, 5 tablets day two, 4 tablets day three, 3 tablets day four, 2 tablets day five, then 1 tablet day six, Disp: 21 tablet, Rfl: 0 .  promethazine (PHENERGAN) 25 MG tablet, Take 1 tablet (25 mg total) by mouth every 6 (six) hours as needed for nausea or vomiting., Disp: 30 tablet, Rfl: 0 .  pseudoephedrine (SUDAFED 12 HOUR) 120 MG 12 hr tablet, Take 1 tablet (120 mg total) by  mouth daily., Disp: 20 tablet, Rfl: 0 .  diclofenac (VOLTAREN) 75 MG EC tablet, Take 1 tablet (75 mg total) by mouth 2 (two) times daily., Disp: 30 tablet, Rfl: 0 .  gabapentin (NEURONTIN) 300 MG capsule, Take 1 capsule (300 mg total) by mouth 3 (three) times daily., Disp: 90 capsule, Rfl: 3  Allergies  Allergen Reactions  . Keflex [Cephalexin] Itching and Rash   Review of Systems: Negative except as noted in the HPI. Denies N/V/F/Ch. Objective:  There were no vitals filed for this visit. General AA&O x3. Normal mood and affect.  Vascular Dorsalis pedis and posterior tibial pulses  present 2+ bilaterally  Capillary refill normal to all digits. Pedal hair growth normal.  Neurologic  Epicritic sensation grossly present.  Dermatologic No open lesions. Interspaces clear of maceration. Nails well groomed and normal in appearance.  Orthopedic: MMT 5/5 in dorsiflexion, plantarflexion, inversion, and eversion. Decreased AJ ROM bilat Posterior heel pain and POP achilles tendon   Posterior spurring with haglund's deformity bilat Assessment & Plan:  Patient was evaluated and treated and all questions answered.  Achilles Tendonitis, bilat -Continue stretching.  Continue PT to maximum medical improvement. -Poor surgical candidate due to BMI of 63.5 and need to remain nonweightbearing   Return if symptoms worsen or fail to improve.

## 2017-11-12 ENCOUNTER — Ambulatory Visit (HOSPITAL_COMMUNITY): Payer: Medicaid Other

## 2017-11-12 ENCOUNTER — Other Ambulatory Visit (HOSPITAL_COMMUNITY): Payer: Self-pay | Admitting: Gastroenterology

## 2017-11-12 DIAGNOSIS — R1084 Generalized abdominal pain: Secondary | ICD-10-CM

## 2017-11-16 ENCOUNTER — Ambulatory Visit (HOSPITAL_COMMUNITY): Payer: Medicaid Other

## 2017-11-16 ENCOUNTER — Telehealth (HOSPITAL_COMMUNITY): Payer: Self-pay

## 2017-11-16 NOTE — Telephone Encounter (Signed)
No Show # 1: I called Ms. Carranco at her home phone number to check in as she missed her 1:00 PM appointment for aquatic physical therapy. She did not answer and I left a message informing her that this does count as a no-show visit and that she has 1 more appointment scheduled at this time. Her next appointment is for Thursday 11/19/17 at 11:15 AM. I informed her she will need to arrive for that appointment to be re-assessed to determine if she would benefit from more therapy. I informed her that she has been approved for visits with her insurance and that this is no longer a concern. I asked that if she cannot make her next appointment to call our front office at [phone].  Kipp Brood, PT, DPT Physical Therapist with [facility]  11/16/2017 5:01 PM

## 2017-11-17 ENCOUNTER — Encounter (HOSPITAL_COMMUNITY): Payer: Medicaid Other

## 2017-11-18 ENCOUNTER — Ambulatory Visit (HOSPITAL_COMMUNITY)
Admission: RE | Admit: 2017-11-18 | Discharge: 2017-11-18 | Disposition: A | Discharge status: Home / Self Care | Payer: Medicaid Other | Source: Ambulatory Visit | Attending: Gastroenterology | Admitting: Gastroenterology

## 2017-11-18 DIAGNOSIS — R1084 Generalized abdominal pain: Secondary | ICD-10-CM | POA: Insufficient documentation

## 2017-11-18 MED ORDER — TECHNETIUM TC 99M MEBROFENIN IV KIT
5.2000 | PACK | Freq: Once | INTRAVENOUS | Status: AC | PRN
  Administered 2017-11-18: 5.2 via INTRAVENOUS

## 2017-11-19 ENCOUNTER — Ambulatory Visit (HOSPITAL_COMMUNITY): Payer: Medicaid Other

## 2017-11-19 ENCOUNTER — Telehealth (HOSPITAL_COMMUNITY): Payer: Self-pay | Admitting: General Practice

## 2017-11-19 NOTE — Telephone Encounter (Signed)
11/19/17  pt called and said to cx - her feet were really swollen... wants to come 8/2 if anything opens up

## 2017-11-20 ENCOUNTER — Encounter

## 2017-11-30 ENCOUNTER — Telehealth: Payer: Self-pay | Admitting: Podiatry

## 2017-11-30 NOTE — Telephone Encounter (Signed)
This is [employer] calling about a medical records request that was sent out on 17 July. We still have not received anything. Please call us back at [phone] extension [phone] to let us know the status of the request or go ahead and fax those records. Thank you.

## 2017-12-17 ENCOUNTER — Telehealth: Payer: Self-pay | Admitting: Orthopedic Surgery

## 2017-12-17 NOTE — Telephone Encounter (Signed)
Amy [name] called stating she has been seen by [facility].  She said "the doctor is not doing anything for her".  She wants an appointment with Dr. Aline Brochure.  I told her that I would have to get him to review her notes and advise if he thinks he can do anything for her.  She understood and will await our call.

## 2018-01-07 DIAGNOSIS — M79676 Pain in unspecified toe(s): Secondary | ICD-10-CM

## 2018-01-12 ENCOUNTER — Encounter (HOSPITAL_COMMUNITY): Payer: Self-pay | Admitting: Physical Therapy

## 2018-01-12 NOTE — Therapy (Signed)
[place], [place], [address] Phone: [phone]   Fax:  [phone]  Patient Details  Name: Amy [name] MRN: [id number] Date of Birth: [birth year] Referring Provider:  Hardie Pulley DPM  Encounter Date: 01/12/2018  PHYSICAL THERAPY DISCHARGE SUMMARY  Visits from Start of Care: 3  Current functional level related to goals / functional outcomes: PT able to stay up on her feet longer    Remaining deficits: Pain   Education / Equipment: HEP Plan: Patient agrees to discharge.  Patient goals were partially met. Patient is being discharged due to not returning since the last visit.  ?????     Rayetta Humphrey, PT CLT [phone] 01/12/2018, 2:52 PM  [facility] [address] [place], [place], [address] Phone: [phone]   Fax:  [phone]

## 2018-05-20 ENCOUNTER — Other Ambulatory Visit (HOSPITAL_COMMUNITY): Payer: Self-pay | Admitting: Family Medicine

## 2018-05-20 ENCOUNTER — Other Ambulatory Visit: Payer: Self-pay | Admitting: Family Medicine

## 2018-05-20 DIAGNOSIS — M47815 Spondylosis without myelopathy or radiculopathy, thoracolumbar region: Secondary | ICD-10-CM

## 2018-06-02 ENCOUNTER — Ambulatory Visit (HOSPITAL_COMMUNITY): Payer: Medicaid Other

## 2018-06-17 ENCOUNTER — Ambulatory Visit (HOSPITAL_COMMUNITY): Payer: Medicaid Other

## 2018-06-26 ENCOUNTER — Ambulatory Visit (HOSPITAL_COMMUNITY): Payer: Medicaid Other

## 2018-06-26 ENCOUNTER — Encounter (HOSPITAL_COMMUNITY): Payer: Self-pay | Admitting: Emergency Medicine

## 2018-06-26 ENCOUNTER — Other Ambulatory Visit: Payer: Self-pay

## 2018-06-26 ENCOUNTER — Encounter (HOSPITAL_COMMUNITY): Payer: Self-pay

## 2018-06-26 ENCOUNTER — Emergency Department (HOSPITAL_COMMUNITY)
Admission: EM | Admit: 2018-06-26 | Discharge: 2018-06-26 | Disposition: A | Discharge status: Home / Self Care | Payer: Medicaid Other | Attending: Emergency Medicine | Admitting: Emergency Medicine

## 2018-06-26 DIAGNOSIS — N390 Urinary tract infection, site not specified: Secondary | ICD-10-CM

## 2018-06-26 DIAGNOSIS — Z79899 Other long term (current) drug therapy: Secondary | ICD-10-CM | POA: Diagnosis not present

## 2018-06-26 DIAGNOSIS — R109 Unspecified abdominal pain: Secondary | ICD-10-CM

## 2018-06-26 DIAGNOSIS — F17210 Nicotine dependence, cigarettes, uncomplicated: Secondary | ICD-10-CM | POA: Insufficient documentation

## 2018-06-26 DIAGNOSIS — R1030 Lower abdominal pain, unspecified: Secondary | ICD-10-CM | POA: Diagnosis present

## 2018-06-26 HISTORY — DX: Disorder of thyroid, unspecified: E07.9

## 2018-06-26 HISTORY — DX: Gastro-esophageal reflux disease without esophagitis: K21.9

## 2018-06-26 LAB — COMPREHENSIVE METABOLIC PANEL
ALT: 37 U/L (ref 0–44)
AST: 48 U/L — ABNORMAL HIGH (ref 15–41)
Albumin: 3.3 g/dL — ABNORMAL LOW (ref 3.5–5.0)
Alkaline Phosphatase: 79 U/L (ref 38–126)
Anion gap: 6 (ref 5–15)
BUN: 11 mg/dL (ref 6–20)
CO2: 28 mmol/L (ref 22–32)
Calcium: 8.4 mg/dL — ABNORMAL LOW (ref 8.9–10.3)
Chloride: 104 mmol/L (ref 98–111)
Creatinine, Ser: 0.99 mg/dL (ref 0.44–1.00)
GFR calc Af Amer: >60 mL/min (ref >60)
GFR calc non Af Amer: >60 mL/min (ref >60)
Glucose, Bld: 91 mg/dL (ref 70–99)
Potassium: 3.7 mmol/L (ref 3.5–5.1)
Sodium: 138 mmol/L (ref 135–145)
Total Bilirubin: 0.6 mg/dL (ref 0.3–1.2)
Total Protein: 7.2 g/dL (ref 6.5–8.1)

## 2018-06-26 LAB — URINALYSIS, ROUTINE W REFLEX MICROSCOPIC
Bilirubin Urine: NEGATIVE
Glucose, UA: NEGATIVE mg/dL
Ketones, ur: NEGATIVE mg/dL
Nitrite: POSITIVE — AB
Protein, ur: 100 mg/dL — AB
Specific Gravity, Urine: 1.02 (ref 1.005–1.030)
WBC, UA: >50 WBC/hpf — ABNORMAL HIGH (ref 0–5)
pH: 6 (ref 5.0–8.0)

## 2018-06-26 LAB — CBC WITH DIFFERENTIAL/PLATELET
Abs Immature Granulocytes: 0.05 10*3/uL (ref 0.00–0.07)
Basophils Absolute: 0 10*3/uL (ref 0.0–0.1)
Basophils Relative: 0 %
Eosinophils Absolute: 0.1 10*3/uL (ref 0.0–0.5)
Eosinophils Relative: 1 %
HCT: 40.7 % (ref 36.0–46.0)
Hemoglobin: 13 g/dL (ref 12.0–15.0)
Immature Granulocytes: 0 %
Lymphocytes Relative: 5 %
Lymphs Abs: 0.6 10*3/uL — ABNORMAL LOW (ref 0.7–4.0)
MCH: 30.5 pg (ref 26.0–34.0)
MCHC: 31.9 g/dL (ref 30.0–36.0)
MCV: 95.5 fL (ref 80.0–100.0)
Monocytes Absolute: 0.4 10*3/uL (ref 0.1–1.0)
Monocytes Relative: 4 %
Neutro Abs: 10.5 10*3/uL — ABNORMAL HIGH (ref 1.7–7.7)
Neutrophils Relative %: 90 %
Platelets: 240 10*3/uL (ref 150–400)
RBC: 4.26 MIL/uL (ref 3.87–5.11)
RDW: 14.1 % (ref 11.5–15.5)
WBC: 11.6 10*3/uL — ABNORMAL HIGH (ref 4.0–10.5)
nRBC: 0 % (ref 0.0–0.2)

## 2018-06-26 LAB — LIPASE, BLOOD: Lipase: 16 U/L (ref 11–51)

## 2018-06-26 LAB — PREGNANCY, URINE: Preg Test, Ur: NEGATIVE

## 2018-06-26 MED ORDER — ONDANSETRON HCL 4 MG PO TABS: 4.0000 mg | ORAL_TABLET | Freq: Three times a day (TID) | ORAL | 0 refills | Status: DC | PRN

## 2018-06-26 MED ORDER — SULFAMETHOXAZOLE-TRIMETHOPRIM 800-160 MG PO TABS: 1.0000 | ORAL_TABLET | Freq: Two times a day (BID) | ORAL | 0 refills | Status: AC

## 2018-06-26 MED ORDER — HYDROCODONE-ACETAMINOPHEN 5-325 MG PO TABS: 1.0000 | ORAL_TABLET | Freq: Four times a day (QID) | ORAL | 0 refills | Status: DC | PRN

## 2018-06-26 MED ORDER — SULFAMETHOXAZOLE-TRIMETHOPRIM 800-160 MG PO TABS: 1.0000 | ORAL_TABLET | Freq: Two times a day (BID) | ORAL | 0 refills | Status: DC

## 2018-06-26 MED ORDER — MORPHINE SULFATE (PF) 4 MG/ML IV SOLN
4.0000 mg | Freq: Once | INTRAVENOUS | Status: AC
  Administered 2018-06-26: 4 mg via INTRAVENOUS
  Filled 2018-06-26: qty 1

## 2018-06-26 MED ORDER — LACTATED RINGERS IV BOLUS
1000.0000 mL | Freq: Once | INTRAVENOUS | Status: AC
  Administered 2018-06-26: 1000 mL via INTRAVENOUS

## 2018-06-26 MED ORDER — SULFAMETHOXAZOLE-TRIMETHOPRIM 800-160 MG PO TABS
1.0000 | ORAL_TABLET | Freq: Once | ORAL | Status: AC
  Administered 2018-06-26: 1 via ORAL
  Filled 2018-06-26: qty 1

## 2018-06-26 MED ORDER — ONDANSETRON HCL 4 MG/2ML IJ SOLN
4.0000 mg | Freq: Once | INTRAMUSCULAR | Status: AC
  Administered 2018-06-26: 4 mg via INTRAVENOUS
  Filled 2018-06-26: qty 2

## 2018-06-26 NOTE — ED Triage Notes (Signed)
Pt reports she is having trouble keeping food down for over a year now.  Has vomited a few times since last night. Pt thinks she may be dehydrated.  Reports odor to urine.

## 2018-06-26 NOTE — ED Notes (Signed)
Pt given water per RN permission

## 2018-06-26 NOTE — ED Notes (Signed)
Pt states she does not need to urinate at this time, aware of DO, urine cup placed at bed side

## 2018-06-30 LAB — URINE CULTURE: Culture: >=100000 — AB

## 2018-07-01 ENCOUNTER — Telehealth (HOSPITAL_COMMUNITY): Payer: Self-pay | Admitting: Pharmacist

## 2018-07-01 ENCOUNTER — Telehealth: Payer: Self-pay

## 2018-07-01 NOTE — Progress Notes (Signed)
ED Antimicrobial Stewardship Positive Culture Follow Up   Amy [name] is an 43 y.o. female who presented to [facility] on (Not on file) with a chief complaint of No chief complaint on file.   Recent Results (from the past 720 hour(s))  Urine culture     Status: Abnormal   Collection Time: 06/26/18  2:21 PM  Result Value Ref Range Status   Specimen Description   Final    URINE, CLEAN CATCH Performed at [facility], [address] [place], [place] [address]    Special Requests   Final    NONE Performed at [facility], [address], [place],  [address]    Culture (A)  Final    >=100,000 COLONIES/mL ESCHERICHIA COLI Confirmed Extended Spectrum Beta-Lactamase Producer (ESBL).  In bloodstream infections from ESBL organisms, carbapenems are preferred over piperacillin/tazobactam. They are shown to have a lower risk of mortality.    Report Status 06/30/2018 FINAL  Final   Organism ID, Bacteria ESCHERICHIA COLI (A)  Final      Susceptibility   Escherichia coli - MIC*    AMPICILLIN >=32 RESISTANT Resistant     CEFAZOLIN >=64 RESISTANT Resistant     CEFTRIAXONE >=64 RESISTANT Resistant     CIPROFLOXACIN >=4 RESISTANT Resistant     GENTAMICIN <=1 SENSITIVE Sensitive     IMIPENEM <=0.25 SENSITIVE Sensitive     NITROFURANTOIN <=16 SENSITIVE Sensitive     TRIMETH/SULFA >=320 RESISTANT Resistant     AMPICILLIN/SULBACTAM >=32 RESISTANT Resistant     PIP/TAZO 8 SENSITIVE Sensitive     Extended ESBL POSITIVE Resistant     * >=100,000 COLONIES/mL ESCHERICHIA COLI    [x]  Treated with Bactrim, organism resistant to prescribed antimicrobial []  Patient discharged originally without antimicrobial agent and treatment is now indicated  New antibiotic prescription: Macrobid 100 mg po bid x 5 days  ED Provider: B. Haywood Pao   MastersJake Church 07/01/2018, 4:01 PM Clinical Pharmacist Monday - Friday phone -  [phone] Saturday - Sunday phone - [phone]

## 2018-07-01 NOTE — Telephone Encounter (Signed)
Post ED Visit - Positive Culture Follow-up: Unsuccessful Patient Follow-up  Culture assessed and recommendations reviewed by:  []  Elenor Quinones, Pharm.D. []  Heide Guile, Pharm.D., BCPS AQ-ID []  Parks Neptune, Pharm.D., BCPS []  Alycia Rossetti, Pharm.D., BCPS []  [place], Pharm.D., BCPS, AAHIVP []  Legrand Como, Pharm.D., BCPS, AAHIVP []  Wynell Balloon, PharmD []  Vincenza Hews, PharmD, BCPS Willis Modena Pharm D Positive urine culture  []  Patient discharged without antimicrobial prescription and treatment is now indicated [x]  Organism is resistant to prescribed ED discharge antimicrobial []  Patient with positive blood cultures   Unable to contact patient after 3 attempts, letter will be sent to address on file  Genia Del 07/01/2018, 4:30 PM

## 2018-07-09 NOTE — ED Provider Notes (Signed)
[facility] Provider Note   CSN: [id number] Arrival date & time: 06/26/18  1123    History   Chief Complaint Chief Complaint  Patient presents with  . Emesis    HPI Amy [name] is a 43 y.o. female.  HPI   8yF with abdominal pain. Diffuse but somewhat worse in lower abdomen. Urine has had strong odor. Denies dysuria. Reports nausea and vomiting. Says she has recurrent issues with n/v for over a year. No fever. No diarrhea.   Past Medical History:  Diagnosis Date  . Acid reflux   . Diverticulosis   . GERD (gastroesophageal reflux disease)   . HSV-2 (herpes simplex virus 2) infection   . Hx of chlamydia infection   . Nexplanon insertion 02/07/2014   Inserted left arm 02/07/14  . Nexplanon removal 02/07/2014  . Obesity   . Pain in right hip 08/09/2012  . Reflux 01/19/2014  . Thyroid disease     Patient Active Problem List   Diagnosis Date Noted  . Achilles tendinitis, left leg 05/27/2016  . Achilles tendinitis, right leg 05/27/2016  . Diarrhea 07/15/2015  . Abdominal pain, chronic, right lower quadrant 08/15/2014  . Abdominal pain   . Nexplanon insertion 02/07/2014  . Nexplanon removal 02/07/2014  . Reflux 01/19/2014  . Heme positive stool 08/04/2013  . Pain in right hip 08/09/2012  . Hx of chlamydia infection 08/05/2012  . 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 Obesity 08/05/2012    Past Surgical History:  Procedure Laterality Date  . CESAREAN SECTION    . COLONOSCOPY N/A 09/01/2013   Procedure: COLONOSCOPY;  Surgeon: Rogene Houston, MD;  Location: [facility];  Service: Endoscopy;  Laterality: N/A;  100     OB History    Gravida  1   Para  1   Term  1   Preterm      AB      Living  1     SAB      TAB      Ectopic      Multiple      Live Births  1            Home Medications    Prior to Admission medications   Medication Sig Start Date End Date Taking? Authorizing Provider  albuterol  (PROVENTIL HFA;VENTOLIN HFA) 108 (90 Base) MCG/ACT inhaler Inhale 2 puffs into the lungs every 6 (six) hours as needed for wheezing or shortness of breath. 07/08/15  Yes Law, Bea Graff, PA-C  Cetirizine HCl (ZYRTEC ALLERGY) 10 MG CAPS Take 1 capsule (10 mg total) by mouth daily. 07/08/15  Yes Law, Bea Graff, PA-C  gabapentin (NEURONTIN) 300 MG capsule Take 1 capsule (300 mg total) by mouth 3 (three) times daily. 10/26/17  Yes Evelina Bucy, DPM  Levothyroxine Sodium (SYNTHROID PO) Take 1 tablet by mouth daily.   Yes [provider]  meclizine (ANTIVERT) 25 MG tablet Take 25 mg by mouth 3 (three) times daily as needed for dizziness.   Yes [provider]  meloxicam (MOBIC) 15 MG tablet Take 15 mg by mouth daily.   Yes [provider]  PANTOPRAZOLE SODIUM PO Take 1 tablet by mouth daily.   Yes [provider]  Vitamin D, Ergocalciferol, (DRISDOL) 1.25 MG (50000 UT) CAPS capsule Take 50,000 Units by mouth every 7 (seven) days.   Yes [provider]  clindamycin (CLEOCIN) 150 MG capsule Take 1 capsule (150 mg total) by  mouth every 6 (six) hours. 01/11/17   Lily Kocher, PA-C  cyclobenzaprine (FLEXERIL) 5 MG tablet Take 1 tablet (5 mg total) by mouth 3 (three) times daily as needed for muscle spasms. 11/06/16   Evalee Jefferson, PA-C  dexamethasone (DECADRON) 4 MG tablet Take 1 tablet (4 mg total) by mouth 2 (two) times daily with a meal. 01/11/17   Lily Kocher, PA-C  dextromethorphan-guaiFENesin [facility] DM) 30-600 MG 12hr tablet Take 1 tablet by mouth 2 (two) times daily. 02/02/17   Fredia Sorrow, MD  diclofenac (VOLTAREN) 75 MG EC tablet Take 1 tablet (75 mg total) by mouth 2 (two) times daily. 10/26/17   Evelina Bucy, DPM  dicyclomine (BENTYL) 20 MG tablet Take 1 tablet (20 mg total) by mouth 2 (two) times daily. 10/23/16   Dorie Rank, MD  etonogestrel (IMPLANON) 68 MG IMPL implant Inject 1 each into the skin once.    [provider]   HYDROcodone-acetaminophen (NORCO/VICODIN) 5-325 MG tablet Take 1 tablet by mouth every 6 (six) hours as needed. 06/26/18   Daleen Bo, MD  ibuprofen (ADVIL,MOTRIN) 600 MG tablet Take 1 tablet (600 mg total) by mouth every 6 (six) hours as needed. 01/06/17   Julianne Rice, MD  loratadine (CLARITIN) 10 MG tablet Take 1 tablet (10 mg total) by mouth daily. 01/06/17   Julianne Rice, MD  ondansetron (ZOFRAN) 4 MG tablet Take 1 tablet (4 mg total) by mouth every 8 (eight) hours as needed for nausea or vomiting. 06/26/18   Daleen Bo, MD  oxyCODONE-acetaminophen (PERCOCET/ROXICET) 5-325 MG tablet Take 1 tablet by mouth every 4 (four) hours as needed. 11/06/16   Idol, Almyra Free, PA-C  predniSONE (DELTASONE) 10 MG tablet Take 6 tablets day one, 5 tablets day two, 4 tablets day three, 3 tablets day four, 2 tablets day five, then 1 tablet day six 11/06/16   Idol, Almyra Free, PA-C  promethazine (PHENERGAN) 25 MG tablet Take 1 tablet (25 mg total) by mouth every 6 (six) hours as needed for nausea or vomiting. 10/23/16   Dorie Rank, MD  pseudoephedrine (SUDAFED 12 HOUR) 120 MG 12 hr tablet Take 1 tablet (120 mg total) by mouth daily. 01/11/17   Lily Kocher, PA-C    Family History Family History  Problem Relation Age of Onset  . Hypertension Mother   . Diabetes Mother   . COPD Mother   . Hypertension Sister   . Cancer Maternal Grandfather   . Cancer Father     Social History Social History   Tobacco Use  . Smoking status: Current Some Day Smoker    Packs/day: 0.50    Types: Cigarettes  . Smokeless tobacco: Never Used  . Tobacco comment: on the weekends when she drinks  Substance Use Topics  . Alcohol use: Not Currently    Comment: on weekends  . Drug use: No     Allergies   Keflex [cephalexin]   Review of Systems Review of Systems   All systems reviewed and negative, other than as noted in HPI.   All systems reviewed and negative, other than as noted in HPI. Physical Exam Updated  Vital Signs BP 119/67 (BP Location: Right Arm)   Pulse (!) 105   Temp 98.4 F (36.9 C) (Oral)   Ht 5\' 4"  (1.626 m)   Wt (!) 175.1 kg   SpO2 98%   BMI 66.26 kg/m   Physical Exam Vitals signs and nursing note reviewed.  Constitutional:      General: She is not in acute  distress.    Appearance: She is well-developed. She is obese.  HENT:     Head: Normocephalic and atraumatic.  Eyes:     General:        Right eye: No discharge.        Left eye: No discharge.     Conjunctiva/sclera: Conjunctivae normal.  Neck:     Musculoskeletal: Neck supple.  Cardiovascular:     Rate and Rhythm: Normal rate and regular rhythm.     Heart sounds: Normal heart sounds. No murmur. No friction rub. No gallop.   Pulmonary:     Effort: Pulmonary effort is normal. No respiratory distress.     Breath sounds: Normal breath sounds.  Abdominal:     General: There is no distension.     Palpations: Abdomen is soft.     Tenderness: There is abdominal tenderness.     Comments: Mild diffuse tenderness w/o rebound or guarding.   Genitourinary:    Comments: No cva tenderness Musculoskeletal:        General: No tenderness.  Skin:    General: Skin is warm and dry.  Neurological:     Mental Status: She is alert.  Psychiatric:        Behavior: Behavior normal.        Thought Content: Thought content normal.      ED Treatments / Results  Labs (all labs ordered are listed, but only abnormal results are displayed) Labs Reviewed  URINE CULTURE - Abnormal; Notable for the following components:      Result Value   Culture   (*)    Value: >=100,000 COLONIES/mL ESCHERICHIA COLI Confirmed Extended Spectrum Beta-Lactamase Producer (ESBL).  In bloodstream infections from ESBL organisms, carbapenems are preferred over piperacillin/tazobactam. They are shown to have a lower risk of mortality.    Organism ID, Bacteria ESCHERICHIA COLI (*)    All other components within normal limits  CBC WITH  DIFFERENTIAL/PLATELET - Abnormal; Notable for the following components:   WBC 11.6 (*)    Neutro Abs 10.5 (*)    Lymphs Abs 0.6 (*)    All other components within normal limits  COMPREHENSIVE METABOLIC PANEL - Abnormal; Notable for the following components:   Calcium 8.4 (*)    Albumin 3.3 (*)    AST 48 (*)    All other components within normal limits  URINALYSIS, ROUTINE W REFLEX MICROSCOPIC - Abnormal; Notable for the following components:   Color, Urine AMBER (*)    APPearance CLOUDY (*)    Hgb urine dipstick SMALL (*)    Protein, ur 100 (*)    Nitrite POSITIVE (*)    Leukocytes,Ua MODERATE (*)    WBC, UA >50 (*)    Bacteria, UA MANY (*)    All other components within normal limits  LIPASE, BLOOD  PREGNANCY, URINE    EKG None  Radiology No results found.   Procedures Procedures (including critical care time)  Medications Ordered in ED Medications  lactated ringers bolus 1,000 mL (0 mLs Intravenous Stopped 06/26/18 1636)  ondansetron (ZOFRAN) injection 4 mg (4 mg Intravenous Given 06/26/18 1241)  sulfamethoxazole-trimethoprim (BACTRIM DS,SEPTRA DS) 800-160 MG per tablet 1 tablet (1 tablet Oral Given 06/26/18 1517)  morphine 4 MG/ML injection 4 mg (4 mg Intravenous Given 06/26/18 1636)     Initial Impression / Assessment and Plan / ED Course  I have reviewed the triage vital signs and the nursing notes.  Pertinent labs & imaging results that were available during my  care of the patient were reviewed by me and considered in my medical decision making (see chart for details).    42yF with abdominal pain. Exam is pretty reassuring. Very mild tenderness w/o focality. UA consistent with UTI. Will place on bactrim. No vomiting in ED. Labs otherwise ok.   Final Clinical Impressions(s) / ED Diagnoses   Final diagnoses:  Abdominal pain, unspecified abdominal location  Urinary tract infection without hematuria, site unspecified    ED Discharge Orders         Ordered     HYDROcodone-acetaminophen (NORCO/VICODIN) 5-325 MG tablet  Every 6 hours PRN,   Status:  Discontinued     06/26/18 1539    ondansetron (ZOFRAN) 4 MG tablet  Every 8 hours PRN,   Status:  Discontinued     06/26/18 1539    sulfamethoxazole-trimethoprim (BACTRIM DS,SEPTRA DS) 800-160 MG tablet  2 times daily,   Status:  Discontinued     06/26/18 1539    HYDROcodone-acetaminophen (NORCO/VICODIN) 5-325 MG tablet  Every 6 hours PRN     06/26/18 1631    ondansetron (ZOFRAN) 4 MG tablet  Every 8 hours PRN     06/26/18 1631    sulfamethoxazole-trimethoprim (BACTRIM DS,SEPTRA DS) 800-160 MG tablet  2 times daily     06/26/18 1631           Virgel Manifold, MD 07/09/18 1223

## 2018-08-16 ENCOUNTER — Encounter (INDEPENDENT_AMBULATORY_CARE_PROVIDER_SITE_OTHER): Payer: Self-pay | Admitting: *Deleted

## 2019-04-25 ENCOUNTER — Ambulatory Visit: Payer: Medicaid Other | Admitting: Orthopedic Surgery

## 2019-04-30 ENCOUNTER — Emergency Department (HOSPITAL_COMMUNITY)
Admission: EM | Admit: 2019-04-30 | Discharge: 2019-04-30 | Disposition: A | Discharge status: Home / Self Care | Payer: Medicaid Other | Attending: Emergency Medicine | Admitting: Emergency Medicine

## 2019-04-30 ENCOUNTER — Other Ambulatory Visit: Payer: Self-pay

## 2019-04-30 ENCOUNTER — Encounter (HOSPITAL_COMMUNITY): Payer: Self-pay | Admitting: *Deleted

## 2019-04-30 DIAGNOSIS — Z79899 Other long term (current) drug therapy: Secondary | ICD-10-CM | POA: Diagnosis not present

## 2019-04-30 DIAGNOSIS — K5792 Diverticulitis of intestine, part unspecified, without perforation or abscess without bleeding: Secondary | ICD-10-CM | POA: Insufficient documentation

## 2019-04-30 DIAGNOSIS — R1084 Generalized abdominal pain: Secondary | ICD-10-CM | POA: Diagnosis present

## 2019-04-30 DIAGNOSIS — F17210 Nicotine dependence, cigarettes, uncomplicated: Secondary | ICD-10-CM | POA: Insufficient documentation

## 2019-04-30 LAB — CBC WITH DIFFERENTIAL/PLATELET
Abs Immature Granulocytes: 0.04 10*3/uL (ref 0.00–0.07)
Basophils Absolute: 0 10*3/uL (ref 0.0–0.1)
Basophils Relative: 0 %
Eosinophils Absolute: 0 10*3/uL (ref 0.0–0.5)
Eosinophils Relative: 0 %
HCT: 43.2 % (ref 36.0–46.0)
Hemoglobin: 14.1 g/dL (ref 12.0–15.0)
Immature Granulocytes: 0 %
Lymphocytes Relative: 13 %
Lymphs Abs: 1.7 10*3/uL (ref 0.7–4.0)
MCH: 32.7 pg (ref 26.0–34.0)
MCHC: 32.6 g/dL (ref 30.0–36.0)
MCV: 100.2 fL — ABNORMAL HIGH (ref 80.0–100.0)
Monocytes Absolute: 0.6 10*3/uL (ref 0.1–1.0)
Monocytes Relative: 5 %
Neutro Abs: 10.6 10*3/uL — ABNORMAL HIGH (ref 1.7–7.7)
Neutrophils Relative %: 82 %
Platelets: 327 10*3/uL (ref 150–400)
RBC: 4.31 MIL/uL (ref 3.87–5.11)
RDW: 13.9 % (ref 11.5–15.5)
WBC: 13 10*3/uL — ABNORMAL HIGH (ref 4.0–10.5)
nRBC: 0 % (ref 0.0–0.2)

## 2019-04-30 LAB — COMPREHENSIVE METABOLIC PANEL
ALT: 26 U/L (ref 0–44)
AST: 38 U/L (ref 15–41)
Albumin: 3.5 g/dL (ref 3.5–5.0)
Alkaline Phosphatase: 52 U/L (ref 38–126)
Anion gap: 9 (ref 5–15)
BUN: 6 mg/dL (ref 6–20)
CO2: 28 mmol/L (ref 22–32)
Calcium: 8.7 mg/dL — ABNORMAL LOW (ref 8.9–10.3)
Chloride: 101 mmol/L (ref 98–111)
Creatinine, Ser: 0.77 mg/dL (ref 0.44–1.00)
GFR calc Af Amer: >60 mL/min (ref >60)
GFR calc non Af Amer: >60 mL/min (ref >60)
Glucose, Bld: 98 mg/dL (ref 70–99)
Potassium: 4.3 mmol/L (ref 3.5–5.1)
Sodium: 138 mmol/L (ref 135–145)
Total Bilirubin: 0.8 mg/dL (ref 0.3–1.2)
Total Protein: 7.4 g/dL (ref 6.5–8.1)

## 2019-04-30 LAB — URINALYSIS, ROUTINE W REFLEX MICROSCOPIC
Bilirubin Urine: NEGATIVE
Glucose, UA: NEGATIVE mg/dL
Hgb urine dipstick: NEGATIVE
Ketones, ur: NEGATIVE mg/dL
Leukocytes,Ua: NEGATIVE
Nitrite: NEGATIVE
Protein, ur: NEGATIVE mg/dL
Specific Gravity, Urine: 1.01 (ref 1.005–1.030)
pH: 7 (ref 5.0–8.0)

## 2019-04-30 LAB — LIPASE, BLOOD: Lipase: 13 U/L (ref 11–51)

## 2019-04-30 LAB — PREGNANCY, URINE: Preg Test, Ur: NEGATIVE

## 2019-04-30 MED ORDER — METRONIDAZOLE 500 MG PO TABS: 500.0000 mg | ORAL_TABLET | Freq: Two times a day (BID) | ORAL | 0 refills | Status: AC

## 2019-04-30 MED ORDER — CIPROFLOXACIN HCL 500 MG PO TABS: 500.0000 mg | ORAL_TABLET | Freq: Two times a day (BID) | ORAL | 0 refills | Status: AC

## 2019-04-30 NOTE — Discharge Instructions (Signed)
See your Physician for recheck in 2-3 days.  Return if any problems. °

## 2019-04-30 NOTE — ED Notes (Signed)
Labs drawn

## 2019-04-30 NOTE — ED Provider Notes (Signed)
[facility] Provider Note   CSN: [id number] Arrival date & time: 04/30/19  1455     History Chief Complaint  Patient presents with  . Abdominal Pain    Amy [name] is a 44 y.o. female.  The history is provided by the patient. No language interpreter was used.  Abdominal Pain Pain location:  Generalized Pain quality: aching   Pain radiates to:  Does not radiate Pain severity:  Moderate Onset quality:  Gradual Timing:  Constant Progression:  Worsening Chronicity:  New Relieved by:  Nothing Worsened by:  Nothing Ineffective treatments:  None tried  Pt complains of left lower abdominal pain.  Pt reports she has had diverticulitis in the past and this seems similar. Pt has taken cipro and flagyl with good results.  Pt denies fever. No vomiting     Past Medical History:  Diagnosis Date  . Acid reflux   . Diverticulosis   . GERD (gastroesophageal reflux disease)   . HSV-2 (herpes simplex virus 2) infection   . Hx of chlamydia infection   . Nexplanon insertion 02/07/2014   Inserted left arm 02/07/14  . Nexplanon removal 02/07/2014  . Obesity   . Pain in right hip 08/09/2012  . Reflux 01/19/2014  . Thyroid disease     Patient Active Problem List   Diagnosis Date Noted  . Achilles tendinitis, left leg 05/27/2016  . Achilles tendinitis, right leg 05/27/2016  . Diarrhea 07/15/2015  . Abdominal pain, chronic, right lower quadrant 08/15/2014  . Abdominal pain   . Nexplanon insertion 02/07/2014  . Nexplanon removal 02/07/2014  . Reflux 01/19/2014  . Heme positive stool 08/04/2013  . Pain in right hip 08/09/2012  . Hx of chlamydia infection 08/05/2012  . 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 Obesity 08/05/2012    Past Surgical History:  Procedure Laterality Date  . CESAREAN SECTION    . COLONOSCOPY N/A 09/01/2013   Procedure: COLONOSCOPY;  Surgeon: Rogene Houston, MD;  Location: [facility];  Service: Endoscopy;   Laterality: N/A;  100     OB History    Gravida  1   Para  1   Term  1   Preterm      AB      Living  1     SAB      TAB      Ectopic      Multiple      Live Births  1           Family History  Problem Relation Age of Onset  . Hypertension Mother   . Diabetes Mother   . COPD Mother   . Hypertension Sister   . Cancer Maternal Grandfather   . Cancer Father     Social History   Tobacco Use  . Smoking status: Current Some Day Smoker    Packs/day: 0.50    Types: Cigarettes  . Smokeless tobacco: Never Used  . Tobacco comment: on the weekends when she drinks  Substance Use Topics  . Alcohol use: Not Currently    Comment: on weekends  . Drug use: No    Home Medications Prior to Admission medications   Medication Sig Start Date End Date Taking? Authorizing Provider  albuterol (PROVENTIL HFA;VENTOLIN HFA) 108 (90 Base) MCG/ACT inhaler Inhale 2 puffs into the lungs every 6 (six) hours as needed for wheezing or shortness of breath. 07/08/15   Law, Bea Graff, PA-C  Cetirizine HCl (ZYRTEC ALLERGY)  10 MG CAPS Take 1 capsule (10 mg total) by mouth daily. 07/08/15   Law, Bea Graff, PA-C  clindamycin (CLEOCIN) 150 MG capsule Take 1 capsule (150 mg total) by mouth every 6 (six) hours. 01/11/17   Lily Kocher, PA-C  cyclobenzaprine (FLEXERIL) 5 MG tablet Take 1 tablet (5 mg total) by mouth 3 (three) times daily as needed for muscle spasms. 11/06/16   Evalee Jefferson, PA-C  dexamethasone (DECADRON) 4 MG tablet Take 1 tablet (4 mg total) by mouth 2 (two) times daily with a meal. 01/11/17   Lily Kocher, PA-C  dextromethorphan-guaiFENesin [facility] DM) 30-600 MG 12hr tablet Take 1 tablet by mouth 2 (two) times daily. 02/02/17   Fredia Sorrow, MD  diclofenac (VOLTAREN) 75 MG EC tablet Take 1 tablet (75 mg total) by mouth 2 (two) times daily. 10/26/17   Evelina Bucy, DPM  dicyclomine (BENTYL) 20 MG tablet Take 1 tablet (20 mg total) by mouth 2 (two) times daily. 10/23/16    Dorie Rank, MD  etonogestrel (IMPLANON) 68 MG IMPL implant Inject 1 each into the skin once.    [provider]  gabapentin (NEURONTIN) 300 MG capsule Take 1 capsule (300 mg total) by mouth 3 (three) times daily. 10/26/17   Evelina Bucy, DPM  HYDROcodone-acetaminophen (NORCO/VICODIN) 5-325 MG tablet Take 1 tablet by mouth every 6 (six) hours as needed. 06/26/18   Daleen Bo, MD  ibuprofen (ADVIL,MOTRIN) 600 MG tablet Take 1 tablet (600 mg total) by mouth every 6 (six) hours as needed. 01/06/17   Julianne Rice, MD  Levothyroxine Sodium (SYNTHROID PO) Take 1 tablet by mouth daily.    [provider]  loratadine (CLARITIN) 10 MG tablet Take 1 tablet (10 mg total) by mouth daily. 01/06/17   Julianne Rice, MD  meclizine (ANTIVERT) 25 MG tablet Take 25 mg by mouth 3 (three) times daily as needed for dizziness.    [provider]  meloxicam (MOBIC) 15 MG tablet Take 15 mg by mouth daily.    [provider]  ondansetron (ZOFRAN) 4 MG tablet Take 1 tablet (4 mg total) by mouth every 8 (eight) hours as needed for nausea or vomiting. 06/26/18   Daleen Bo, MD  oxyCODONE-acetaminophen (PERCOCET/ROXICET) 5-325 MG tablet Take 1 tablet by mouth every 4 (four) hours as needed. 11/06/16   Evalee Jefferson, PA-C  PANTOPRAZOLE SODIUM PO Take 1 tablet by mouth daily.    [provider]  predniSONE (DELTASONE) 10 MG tablet Take 6 tablets day one, 5 tablets day two, 4 tablets day three, 3 tablets day four, 2 tablets day five, then 1 tablet day six 11/06/16   Idol, Almyra Free, PA-C  promethazine (PHENERGAN) 25 MG tablet Take 1 tablet (25 mg total) by mouth every 6 (six) hours as needed for nausea or vomiting. 10/23/16   Dorie Rank, MD  pseudoephedrine (SUDAFED 12 HOUR) 120 MG 12 hr tablet Take 1 tablet (120 mg total) by mouth daily. 01/11/17   Lily Kocher, PA-C  Vitamin D, Ergocalciferol, (DRISDOL) 1.25 MG (50000 UT) CAPS capsule Take 50,000 Units by mouth every 7 (seven) days.     [provider]    Allergies    Keflex [cephalexin]  Review of Systems   Review of Systems  Gastrointestinal: Positive for abdominal pain.  All other systems reviewed and are negative.   Physical Exam Updated Vital Signs BP (!) 151/108 (BP Location: Right Arm)   Pulse 94   Temp 98.3 F (36.8 C) (Oral)   Resp (!) 22  Ht 5\' 6"  (1.676 m)   Wt (!) 168.3 kg   SpO2 97%   BMI 59.88 kg/m   Physical Exam Vitals and nursing note reviewed.  Constitutional:      Appearance: She is well-developed.  HENT:     Head: Normocephalic.  Cardiovascular:     Rate and Rhythm: Normal rate.  Pulmonary:     Effort: Pulmonary effort is normal.  Abdominal:     General: Bowel sounds are normal. There is no distension.     Tenderness: There is abdominal tenderness in the left lower quadrant.  Musculoskeletal:        General: Normal range of motion.     Cervical back: Normal range of motion.  Skin:    General: Skin is warm.  Neurological:     General: No focal deficit present.     Mental Status: She is alert and oriented to person, place, and time.  Psychiatric:        Mood and Affect: Mood normal.     ED Results / Procedures / Treatments   Labs (all labs ordered are listed, but only abnormal results are displayed) Labs Reviewed  CBC WITH DIFFERENTIAL/PLATELET - Abnormal; Notable for the following components:      Result Value   WBC 13.0 (*)    MCV 100.2 (*)    Neutro Abs 10.6 (*)    All other components within normal limits  COMPREHENSIVE METABOLIC PANEL - Abnormal; Notable for the following components:   Calcium 8.7 (*)    All other components within normal limits  URINALYSIS, ROUTINE W REFLEX MICROSCOPIC  PREGNANCY, URINE  LIPASE, BLOOD    EKG None  Radiology No results found.  Procedures Procedures (including critical care time)  Medications Ordered in ED Medications - No data to display  ED Course  I have reviewed the triage vital signs and the  nursing notes.  Pertinent labs & imaging results that were available during my care of the patient were reviewed by me and considered in my medical decision making (see chart for details).    MDM Rules/Calculators/A&P                      MDM  I will treat pt with  Final Clinical Impression(s) / ED Diagnoses Final diagnoses:  Diverticulitis    Rx / DC Orders ED Discharge Orders         Ordered    ciprofloxacin (CIPRO) 500 MG tablet  2 times daily     04/30/19 1948    metroNIDAZOLE (FLAGYL) 500 MG tablet  2 times daily     04/30/19 1948        An After Visit Summary was printed and given to the patient.    Fransico Meadow, Hershal Coria 04/30/19 1950    Milton Ferguson, MD 04/30/19 2148

## 2019-04-30 NOTE — ED Triage Notes (Signed)
Pt states her right chest pain ongoing for 5 years and has seen several doctors for the same.  Pt tearful in triage about it.

## 2019-04-30 NOTE — ED Notes (Signed)
Pt reports she waas being seen at the [facility] in [place] and they did not help her so she has seen Dr Criss Rosales  She saw Dr Criss Rosales in December and was to be referred to GI specialist and has not received a date   She reports that she has been constipated and is given constipation education  Metamucil, miralax, mag citrate as well as other OTC meds  She is here for CP of 6 years and abd pain for the same

## 2019-04-30 NOTE — ED Triage Notes (Signed)
Pt with LLQ burning this morning.  Right upper chest pain, seen PCP for this and sent for a mammogram.

## 2019-05-02 ENCOUNTER — Ambulatory Visit (INDEPENDENT_AMBULATORY_CARE_PROVIDER_SITE_OTHER): Payer: Medicaid Other | Admitting: Orthopedic Surgery

## 2019-05-02 ENCOUNTER — Other Ambulatory Visit: Payer: Self-pay

## 2019-05-02 ENCOUNTER — Encounter: Payer: Self-pay | Admitting: Orthopedic Surgery

## 2019-05-02 VITALS — Ht 66.0 in | Wt 371.0 lb

## 2019-05-02 DIAGNOSIS — M7661 Achilles tendinitis, right leg: Secondary | ICD-10-CM

## 2019-05-02 DIAGNOSIS — M7662 Achilles tendinitis, left leg: Secondary | ICD-10-CM

## 2019-05-02 DIAGNOSIS — M722 Plantar fascial fibromatosis: Secondary | ICD-10-CM

## 2019-05-02 NOTE — Progress Notes (Signed)
Office Visit Note   Patient: Amy [name]           Date of Birth: [birth year]           MRN: [id number] Visit Date: 05/02/2019              Requested by: No referring provider defined for this encounter. PCP: Lucianne Lei, MD  Chief Complaint  Patient presents with  . Left Foot - Pain  . Right Foot - Pain      HPI: Patient is a 44 year old woman with a BMI of approximately 60 who has been having pain at the insertion of the Achilles as well as along the plantar fascia.  Patient states the bone spurs are getting worse she did see a podiatrist last year who stated that there was nothing he could do for her.  She states she has pain with weightbearing for any length of time.  Assessment & Plan: Visit Diagnoses:  1. Achilles tendinitis, left leg   2. Achilles tendinitis, right leg   3. Plantar fasciitis, bilateral     Plan: Patient was given instructions and demonstrated Achilles stretching.  Discussed that if we do not get an improvement in the range of motion of her ankle and she is still symptomatic we could inject the painful tendon or fascia.  Discussed the importance of a stiff soled sneaker such as a new balance sneaker.  Follow-Up Instructions: Return if symptoms worsen or fail to improve.   Ortho Exam  Patient is alert, oriented, no adenopathy, well-dressed, normal affect, normal respiratory effort. Examination patient has good circulation no arterial insufficiency.  She has significant heel cord contracture and worse on the right than the left with dorsiflexion 20 degrees short of neutral on the right dorsiflexion to neutral on the left.  She has pain to palpation at the insertion of the Achilles and pain over the plantar fascia.  Radiograph shows large bony spurs on the calcaneus.  Imaging: No results found. No images are attached to the encounter.  Labs: Lab Results  Component Value Date   REPTSTATUS 06/30/2018 FINAL 06/26/2018   CULT (A) 06/26/2018   >=100,000 COLONIES/mL ESCHERICHIA COLI Confirmed Extended Spectrum Beta-Lactamase Producer (ESBL).  In bloodstream infections from ESBL organisms, carbapenems are preferred over piperacillin/tazobactam. They are shown to have a lower risk of mortality.    LABORGA ESCHERICHIA COLI (A) 06/26/2018     Lab Results  Component Value Date   ALBUMIN 3.5 04/30/2019   ALBUMIN 3.3 (L) 06/26/2018   ALBUMIN 3.3 (L) 10/23/2016    No results found for: MG No results found for: VD25OH  No results found for: PREALBUMIN CBC EXTENDED Latest Ref Rng & Units 04/30/2019 06/26/2018 02/02/2017  WBC 4.0 - 10.5 K/uL 13.0(H) 11.6(H) 11.2(H)  RBC 3.87 - 5.11 MIL/uL 4.31 4.26 4.47  HGB 12.0 - 15.0 g/dL 14.1 13.0 14.1  HCT 36.0 - 46.0 % 43.2 40.7 42.7  PLT 150 - 400 K/uL 327 240 275  NEUTROABS 1.7 - 7.7 K/uL 10.6(H) 10.5(H) -  LYMPHSABS 0.7 - 4.0 K/uL 1.7 0.6(L) -     Body mass index is 59.88 kg/m.  Orders:  No orders of the defined types were placed in this encounter.  No orders of the defined types were placed in this encounter.    Procedures: No procedures performed  Clinical Data: No additional findings.  ROS:  All other systems negative, except as noted in the HPI. Review of Systems  Objective: Vital Signs: Ht 5'  6" (1.676 m)   Wt (!) 371 lb (168.3 kg)   BMI 59.88 kg/m   Specialty Comments:  No specialty comments available.  PMFS History: Patient Active Problem List   Diagnosis Date Noted  . Achilles tendinitis, left leg 05/27/2016  . Achilles tendinitis, right leg 05/27/2016  . Diarrhea 07/15/2015  . Abdominal pain, chronic, right lower quadrant 08/15/2014  . Abdominal pain   . Nexplanon insertion 02/07/2014  . Nexplanon removal 02/07/2014  . Reflux 01/19/2014  . Heme positive stool 08/04/2013  . Pain in right hip 08/09/2012  . Hx of chlamydia infection 08/05/2012  . 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 Obesity 08/05/2012   Past Medical History:    Diagnosis Date  . Acid reflux   . Diverticulosis   . GERD (gastroesophageal reflux disease)   . HSV-2 (herpes simplex virus 2) infection   . Hx of chlamydia infection   . Nexplanon insertion 02/07/2014   Inserted left arm 02/07/14  . Nexplanon removal 02/07/2014  . Obesity   . Pain in right hip 08/09/2012  . Reflux 01/19/2014  . Thyroid disease     Family History  Problem Relation Age of Onset  . Hypertension Mother   . Diabetes Mother   . COPD Mother   . Hypertension Sister   . Cancer Maternal Grandfather   . Cancer Father     Past Surgical History:  Procedure Laterality Date  . CESAREAN SECTION    . COLONOSCOPY N/A 09/01/2013   Procedure: COLONOSCOPY;  Surgeon: Rogene Houston, MD;  Location: [facility];  Service: Endoscopy;  Laterality: N/A;  100   Social History   Occupational History  . Not on file  Tobacco Use  . Smoking status: Current Some Day Smoker    Packs/day: 0.50    Types: Cigarettes  . Smokeless tobacco: Never Used  . Tobacco comment: on the weekends when she drinks  Substance and Sexual Activity  . Alcohol use: Not Currently    Comment: on weekends  . Drug use: No  . Sexual activity: Yes    Birth control/protection: Implant

## 2019-05-12 ENCOUNTER — Ambulatory Visit (INDEPENDENT_AMBULATORY_CARE_PROVIDER_SITE_OTHER): Payer: Medicaid Other | Admitting: Gastroenterology

## 2019-05-30 ENCOUNTER — Ambulatory Visit (INDEPENDENT_AMBULATORY_CARE_PROVIDER_SITE_OTHER): Payer: Medicaid Other | Admitting: Gastroenterology

## 2019-05-30 ENCOUNTER — Encounter (INDEPENDENT_AMBULATORY_CARE_PROVIDER_SITE_OTHER): Payer: Self-pay | Admitting: Gastroenterology

## 2019-05-30 ENCOUNTER — Other Ambulatory Visit: Payer: Self-pay

## 2019-05-30 VITALS — BP 127/88 | HR 98 | Temp 98.1°F | Ht 64.0 in | Wt 383.3 lb

## 2019-05-30 DIAGNOSIS — K5732 Diverticulitis of large intestine without perforation or abscess without bleeding: Secondary | ICD-10-CM

## 2019-05-30 DIAGNOSIS — Z800 Family history of malignant neoplasm of digestive organs: Secondary | ICD-10-CM | POA: Diagnosis not present

## 2019-05-30 DIAGNOSIS — K5900 Constipation, unspecified: Secondary | ICD-10-CM | POA: Diagnosis not present

## 2019-05-30 DIAGNOSIS — Z86010 Personal history of colonic polyps: Secondary | ICD-10-CM | POA: Diagnosis not present

## 2019-05-30 MED ORDER — POLYETHYLENE GLYCOL 3350 17 GM/SCOOP PO POWD: 17.0000 g | Freq: Every day | ORAL | 1 refills | Status: DC

## 2019-05-30 NOTE — Progress Notes (Signed)
Patient profile: Amy [name] is a 44 y.o. female seen for evaluation of LLQ pain. Pt reports hx of diverticulitis and was treated empirically 04/30/19 in [facility]. WBC 13.0, MCV 100, Hgb normal, CMP normal, lipase normal. UA normal.   History of Present Illness: Amy [name] is seen today for follow-up after ER visit.  She reports having a long history of diverticulitis and has needed Cipro Flagyl approximately 2 times a year over the past several years.  She completely avoid seeds nuts and popcorn.  Will get acute onset of left lower quadrant pain and states she always does well with antibiotic therapy and symptoms resolved quickly after starting antibiotics.  Her chronic bowel habits vary from no bowel movement for 2 to 3 days then passing a hard stool followed by several episodes of liquid stools.  She has some occasional blood in stool but more prominent when having diarrhea after bough of constipation and may be anal outlet and skin irritation related.  Does not have much abdominal pain between episodes of diverticulitis.  Biggest concern today seems to be right upper quadrant pain-reports is ongoing for 6 years radiating from her right upper side around her right back. Seems to be worse with sitting for long periods or laying flat. Reports sometimes bending over will lead to vomiting and makes the right upper quadrant pain worse. RUQ pain seems better if she stretches to her left side.  When she does have vomiting she reports vomiting up undigested foods from several hours prior as well as bowel at times.  Feels right upper side feels "swollen & inflamed".  States she is vomiting about every day.  Concerned that right upper quadrant pain was due to back issues but she has had trouble getting an MRI.  She reports she is no longer on diclofenac on her medicine list.  She was previously using naproxen but stopped about a month ago.  Wt Readings from Last 3 Encounters:  05/30/19 (!) 383 lb 4.8 oz  (173.9 kg)  05/02/19 (!) 371 lb (168.3 kg)  04/30/19 (!) 371 lb (168.3 kg)     Last Colonoscopy: Impression: 2015 Examination performed to cecum. Few scattered diverticula at ascending and sigmoid colon. Three small polyps ablated via cold biopsy and submitted together(2 at splenic flexure and one at sigmoid colon). Internal hemorrhoids felt to be most likely source of intermittent hematochezia.  Patient had 3 small polyps removed. One is sessile serrated polyp and the other two hyperplastic. Next colonoscopy in 5 years  Last Endoscopy: Per patient last year in [place] [facility] records   Past Medical History:  Past Medical History:  Diagnosis Date  . Acid reflux   . Diverticulosis   . GERD (gastroesophageal reflux disease)   . HSV-2 (herpes simplex virus 2) infection   . Hx of chlamydia infection   . Nexplanon insertion 02/07/2014   Inserted left arm 02/07/14  . Nexplanon removal 02/07/2014  . Obesity   . Pain in right hip 08/09/2012  . Reflux 01/19/2014  . Thyroid disease     Problem List: Patient Active Problem List   Diagnosis Date Noted  . Achilles tendinitis, left leg 05/27/2016  . Achilles tendinitis, right leg 05/27/2016  . Diarrhea 07/15/2015  . Abdominal pain, chronic, right lower quadrant 08/15/2014  . Abdominal pain   . Nexplanon insertion 02/07/2014  . Nexplanon removal 02/07/2014  . Reflux 01/19/2014  . Heme positive stool 08/04/2013  . Pain in right hip 08/09/2012  . Hx of chlamydia  infection 08/05/2012  . 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 Obesity 08/05/2012    Past Surgical History: Past Surgical History:  Procedure Laterality Date  . CESAREAN SECTION    . COLONOSCOPY N/A 09/01/2013   Procedure: COLONOSCOPY;  Surgeon: Rogene Houston, MD;  Location: [facility];  Service: Endoscopy;  Laterality: N/A;  100    Allergies: Allergies  Allergen Reactions  . Keflex [Cephalexin] Itching and Rash      Home  Medications:  Current Outpatient Medications:  .  albuterol (PROVENTIL HFA;VENTOLIN HFA) 108 (90 Base) MCG/ACT inhaler, Inhale 2 puffs into the lungs every 6 (six) hours as needed for wheezing or shortness of breath., Disp: 1 Inhaler, Rfl: 2 .  Cetirizine HCl (ZYRTEC ALLERGY) 10 MG CAPS, Take 1 capsule (10 mg total) by mouth daily., Disp: 30 capsule, Rfl: 0 .  dextromethorphan-guaiFENesin (MUCINEX DM) 30-600 MG 12hr tablet, Take 1 tablet by mouth 2 (two) times daily., Disp: 14 tablet, Rfl: 1 .  etonogestrel (IMPLANON) 68 MG IMPL implant, Inject 1 each into the skin once., Disp: , Rfl:  .  Levothyroxine Sodium (SYNTHROID PO), Take 1 tablet by mouth daily., Disp: , Rfl:  .  pantoprazole (PROTONIX) 40 MG tablet, Take 1 tablet by mouth daily. , Disp: , Rfl:  .  diclofenac (VOLTAREN) 75 MG EC tablet, Take 1 tablet (75 mg total) by mouth 2 (two) times daily. (Patient not taking: Reported on 05/30/2019), Disp: 30 tablet, Rfl: 0 .  polyethylene glycol powder (MIRALAX) 17 GM/SCOOP powder, Take 17 g by mouth daily., Disp: 850 g, Rfl: 1   Family History: family history includes COPD in her mother; Cancer in her father and maternal grandfather; Diabetes in her mother; Hypertension in her mother and sister.    Social History:   reports that she has been smoking cigarettes. She has been smoking about 0.50 packs per day. She has never used smokeless tobacco. She reports previous alcohol use. She reports that she does not use drugs.   Review of Systems: Constitutional: Denies weight loss/weight gain  Eyes: No changes in vision. ENT: No oral lesions, sore throat.  GI: see HPI.  Heme/Lymph: No easy bruising.  CV: No chest pain.  GU: No hematuria.  Integumentary: No rashes.  Neuro: No headaches.  Psych: No depression/anxiety.  Endocrine: No heat/cold intolerance.  Allergic/Immunologic: No urticaria.  Resp: No cough, SOB.  Musculoskeletal: No joint swelling.    Physical Examination: BP 127/88 (BP  Location: Right Arm, Patient Position: Sitting, Cuff Size: Large)   Pulse 98   Temp 98.1 F (36.7 C) (Temporal)   Ht 5\' 4"  (1.626 m)   Wt (!) 383 lb 4.8 oz (173.9 kg)   BMI 65.79 kg/m  Gen: NAD, alert and oriented x 4, obese  HEENT: PEERLA, EOMI, Neck: supple, no JVD Chest: CTA bilaterally, no wheezes, crackles, or other adventitious sounds CV: RRR, no m/g/c/r Abd: soft, TTP RUQ, no TTP LLQ, ND, +BS in all four quadrants; no HSM, guarding, ridigity, or rebound tenderness Ext: no edema, well perfused with 2+ pulses, Skin: no rash or lesions noted on observed skin Lymph: no noted LAD  Data:  10/2017-Calculated gallbladder ejection fraction is 90%. (Normal gallbladder ejection fraction with Ensure is greater than 33%.)  IMPRESSION: Normal gallbladder ejection fraction after Ensure administration.   Assessment/Plan: Ms. Denaro is a 44 y.o. female  Geralyn was seen today for new patient (initial visit).  Diagnoses and all orders for this visit:  Diverticulitis of colon  Constipation, unspecified constipation type  Family history of colon cancer  Personal history of colonic polyps  Other orders -     polyethylene glycol powder (MIRALAX) 17 GM/SCOOP powder; Take 17 g by mouth daily.      1.  Diverticulitis-has responded well to treatment with Cipro Flagyl.  She is compliant with diet modifications.  She is having fairly frequent episodes twice a year.  We discussed starting some MiraLAX to decrease her underlying constipation which seems fairly significant. Likely poor surgical candidate for resection based on her BMI.  She is to notify me if MiraLAX does not help constipation.  Reports she has had side effects with Linzess in the past.  2.  Right upper quadrant pain-x6 years,suspect this is more musculoskeletal etiology.  She reports she has had a CT and an EGD within the past year for evaluation. Also reports some vomiting w/ bending but may have large HH, etc. We will  request copy of CT and EGD prior to further recommendations given symptoms unchanged since work up.  She also had a HIDA scan for evaluation in 2019 unremarkable.   3. GERD-Protonix 40 mg once a day with good control of GERD symptoms.  No dysphagia.  Requesting EGD records from last year  4. Hx colon polyps/hx colon cancer father-last colonoscopy 2015 and she did have adenomas.  She is due for repeat.  She is higher risk for sedation based on her BMI of 65, will verify with Dr. Laural Golden okay to schedule at [facility].  She denies prior issues with sedation.  She uses albuterol few times a month  I personally performed the service, non-incident to. (WP)  Laurine Blazer, [facility]

## 2019-05-30 NOTE — Patient Instructions (Addendum)
-  We are starting miralax - take daily, increase to twice a day if needed. Make sure getting adequate fluid intake.   -We are requesting records for CT and endoscopy   -call if diverticulitis symptoms return.   -we will call when can schedule colonoscopy

## 2019-05-31 ENCOUNTER — Encounter (INDEPENDENT_AMBULATORY_CARE_PROVIDER_SITE_OTHER): Payer: Self-pay | Admitting: *Deleted

## 2019-05-31 ENCOUNTER — Telehealth (INDEPENDENT_AMBULATORY_CARE_PROVIDER_SITE_OTHER): Payer: Self-pay | Admitting: *Deleted

## 2019-05-31 ENCOUNTER — Other Ambulatory Visit (INDEPENDENT_AMBULATORY_CARE_PROVIDER_SITE_OTHER): Payer: Self-pay | Admitting: *Deleted

## 2019-05-31 DIAGNOSIS — Z86010 Personal history of colonic polyps: Secondary | ICD-10-CM

## 2019-05-31 DIAGNOSIS — Z800 Family history of malignant neoplasm of digestive organs: Secondary | ICD-10-CM

## 2019-05-31 DIAGNOSIS — K5732 Diverticulitis of large intestine without perforation or abscess without bleeding: Secondary | ICD-10-CM

## 2019-05-31 MED ORDER — SUPREP BOWEL PREP KIT 17.5-3.13-1.6 GM/177ML PO SOLN: 1.0000 | Freq: Once | ORAL | 0 refills | Status: AC

## 2019-05-31 NOTE — Telephone Encounter (Signed)
Patient needs suprep TCS sch'd 3/12 

## 2019-06-01 NOTE — Telephone Encounter (Signed)
Ann-Dr. Laural Golden wanted this patient to have monitored anesthesia.  Can you make sure she is arranged on a propofol day . Thanks.

## 2019-06-01 NOTE — Telephone Encounter (Signed)
Yes ma'am, she is scheduled with propofol

## 2019-06-15 ENCOUNTER — Telehealth (INDEPENDENT_AMBULATORY_CARE_PROVIDER_SITE_OTHER): Payer: Self-pay | Admitting: Gastroenterology

## 2019-06-15 NOTE — Telephone Encounter (Signed)
Patient called stated she is scheduled for a colonoscopy - has another doctor that wants her to have an EGD at the same time - please advise - [phone]

## 2019-06-15 NOTE — Telephone Encounter (Signed)
At time of office visit patient reported that she had had an endoscopy last year, please find out more information about which physician wants her to have a repeat EGD.     We also did not get a copy of the endoscopy she had last year in [place] yet, can we re-request? Thanks.

## 2019-06-16 NOTE — Telephone Encounter (Signed)
Amy [name] , Please call and verify with the patient these questions per Thayer Headings.  Thank You.

## 2019-06-27 NOTE — Patient Instructions (Signed)
NASHAI VANDENBROEK  06/27/2019     @PREFPERIOPPHARMACY @   Your procedure is scheduled on  07/01/2019.  Report to [facility] at  [phone]   A.M.  Call this number if you have problems the morning of surgery:  [phone]   Remember:  Follow the diet and prep instructions given to you by Dr Olevia Perches office.                    Take these medicines the morning of surgery with A SIP OF WATER  Zyrtec, levothyroxine, protonix.    Do not wear jewelry, make-up or nail polish.  Do not wear lotions, powders, or perfumes. Please wear deodorant and brush your teeth.  Do not shave 48 hours prior to surgery.  Men may shave face and neck.  Do not bring valuables to the hospital.  [facility] is not responsible for any belongings or valuables.  Contacts, dentures or bridgework may not be worn into surgery.  Leave your suitcase in the car.  After surgery it may be brought to your room.  For patients admitted to the hospital, discharge time will be determined by your treatment team.  Patients discharged the day of surgery will not be allowed to drive home.   Name and phone number of your driver:   family Special instructions:  DO NOT smoke the morning of your procedure.  Please read over the following fact sheets that you were given. Anesthesia Post-op Instructions and Care and Recovery After Surgery       Colonoscopy, Adult, Care After This sheet gives you information about how to care for yourself after your procedure. Your health care provider may also give you more specific instructions. If you have problems or questions, contact your health care provider. What can I expect after the procedure? After the procedure, it is common to have:  A small amount of blood in your stool for 24 hours after the procedure.  Some gas.  Mild cramping or bloating of your abdomen. Follow these instructions at home: Eating and drinking   Drink enough fluid to keep your urine pale  yellow.  Follow instructions from your health care provider about eating or drinking restrictions.  Resume your normal diet as instructed by your health care provider. Avoid heavy or fried foods that are hard to digest. Activity  Rest as told by your health care provider.  Avoid sitting for a long time without moving. Get up to take short walks every 1-2 hours. This is important to improve blood flow and breathing. Ask for help if you feel weak or unsteady.  Return to your normal activities as told by your health care provider. Ask your health care provider what activities are safe for you. Managing cramping and bloating   Try walking around when you have cramps or feel bloated.  Apply heat to your abdomen as told by your health care provider. Use the heat source that your health care provider recommends, such as a moist heat pack or a heating pad. ? Place a towel between your skin and the heat source. ? Leave the heat on for 20-30 minutes. ? Remove the heat if your skin turns bright red. This is especially important if you are unable to feel pain, heat, or cold. You may have a greater risk of getting burned. General instructions  For the first 24 hours after the procedure: ? Do not drive or use machinery. ? Do not sign  important documents. ? Do not drink alcohol. ? Do your regular daily activities at a slower pace than normal. ? Eat soft foods that are easy to digest.  Take over-the-counter and prescription medicines only as told by your health care provider.  Keep all follow-up visits as told by your health care provider. This is important. Contact a health care provider if:  You have blood in your stool 2-3 days after the procedure. Get help right away if you have:  More than a small spotting of blood in your stool.  Large blood clots in your stool.  Swelling of your abdomen.  Nausea or vomiting.  A fever.  Increasing pain in your abdomen that is not relieved with  medicine. Summary  After the procedure, it is common to have a small amount of blood in your stool. You may also have mild cramping and bloating of your abdomen.  For the first 24 hours after the procedure, do not drive or use machinery, sign important documents, or drink alcohol.  Get help right away if you have a lot of blood in your stool, nausea or vomiting, a fever, or increased pain in your abdomen. This information is not intended to replace advice given to you by your health care provider. Make sure you discuss any questions you have with your health care provider. Document Revised: 11/01/2018 Document Reviewed: 11/01/2018 Elsevier Patient Education  [place] These instructions provide you with information about caring for yourself after your procedure. Your health care provider may also give you more specific instructions. Your treatment has been planned according to current medical practices, but problems sometimes occur. Call your health care provider if you have any problems or questions after your procedure. What can I expect after the procedure? After your procedure, you may:  Feel sleepy for several hours.  Feel clumsy and have poor balance for several hours.  Feel forgetful about what happened after the procedure.  Have poor judgment for several hours.  Feel nauseous or vomit.  Have a sore throat if you had a breathing tube during the procedure. Follow these instructions at home: For at least 24 hours after the procedure:      Have a responsible adult stay with you. It is important to have someone help care for you until you are awake and alert.  Rest as needed.  Do not: ? Participate in activities in which you could fall or become injured. ? Drive. ? Use heavy machinery. ? Drink alcohol. ? Take sleeping pills or medicines that cause drowsiness. ? Make important decisions or sign legal documents. ? Take care  of children on your own. Eating and drinking  Follow the diet that is recommended by your health care provider.  If you vomit, drink water, juice, or soup when you can drink without vomiting.  Make sure you have little or no nausea before eating solid foods. General instructions  Take over-the-counter and prescription medicines only as told by your health care provider.  If you have sleep apnea, surgery and certain medicines can increase your risk for breathing problems. Follow instructions from your health care provider about wearing your sleep device: ? Anytime you are sleeping, including during daytime naps. ? While taking prescription pain medicines, sleeping medicines, or medicines that make you drowsy.  If you smoke, do not smoke without supervision.  Keep all follow-up visits as told by your health care provider. This is important. Contact a health care provider if:  You keep feeling nauseous or you keep vomiting.  You feel light-headed.  You develop a rash.  You have a fever. Get help right away if:  You have trouble breathing. Summary  For several hours after your procedure, you may feel sleepy and have poor judgment.  Have a responsible adult stay with you for at least 24 hours or until you are awake and alert. This information is not intended to replace advice given to you by your health care provider. Make sure you discuss any questions you have with your health care provider. Document Revised: 07/06/2017 Document Reviewed: 07/29/2015 Elsevier Patient Education  [place].

## 2019-06-29 ENCOUNTER — Other Ambulatory Visit (INDEPENDENT_AMBULATORY_CARE_PROVIDER_SITE_OTHER): Payer: Self-pay | Admitting: *Deleted

## 2019-06-29 ENCOUNTER — Other Ambulatory Visit (HOSPITAL_COMMUNITY)
Admission: RE | Admit: 2019-06-29 | Discharge: 2019-06-29 | Disposition: A | Discharge status: Home / Self Care | Payer: Medicaid Other | Source: Ambulatory Visit | Attending: Internal Medicine | Admitting: Internal Medicine

## 2019-06-29 ENCOUNTER — Other Ambulatory Visit: Payer: Self-pay

## 2019-06-29 ENCOUNTER — Encounter (INDEPENDENT_AMBULATORY_CARE_PROVIDER_SITE_OTHER): Payer: Self-pay | Admitting: *Deleted

## 2019-06-29 ENCOUNTER — Encounter (HOSPITAL_COMMUNITY)
Admission: RE | Admit: 2019-06-29 | Discharge: 2019-06-29 | Disposition: A | Discharge status: Home / Self Care | Payer: Medicaid Other | Source: Ambulatory Visit | Attending: Internal Medicine | Admitting: Internal Medicine

## 2019-06-29 ENCOUNTER — Encounter (HOSPITAL_COMMUNITY): Payer: Self-pay

## 2019-06-29 DIAGNOSIS — Z800 Family history of malignant neoplasm of digestive organs: Secondary | ICD-10-CM

## 2019-06-29 DIAGNOSIS — K5732 Diverticulitis of large intestine without perforation or abscess without bleeding: Secondary | ICD-10-CM

## 2019-06-29 DIAGNOSIS — Z86010 Personal history of colonic polyps: Secondary | ICD-10-CM

## 2019-06-29 DIAGNOSIS — R1011 Right upper quadrant pain: Secondary | ICD-10-CM

## 2019-06-29 DIAGNOSIS — R112 Nausea with vomiting, unspecified: Secondary | ICD-10-CM

## 2019-07-01 ENCOUNTER — Encounter (HOSPITAL_COMMUNITY): Admission: RE | Payer: Self-pay | Source: Home / Self Care

## 2019-07-01 ENCOUNTER — Ambulatory Visit (HOSPITAL_COMMUNITY): Admission: RE | Admit: 2019-07-01 | Payer: Medicaid Other | Source: Home / Self Care | Admitting: Internal Medicine

## 2019-07-01 SURGERY — COLONOSCOPY WITH PROPOFOL
Anesthesia: Monitor Anesthesia Care

## 2019-07-25 NOTE — Patient Instructions (Signed)
Amy [name]  07/25/2019     @PREFPERIOPPHARMACY @   Your procedure is scheduled on  07/29/2019 .  Report to [facility] at  1230  P.M.  Call this number if you have problems the morning of surgery:  [phone]   Remember:  Follow the diet and prep instructions given to you by Dr Olevia Perches office.                   Take these medicines the morning of surgery with A SIP OF WATER  Lexapro, levothyroxine, antivert (IF NEEDED), protonix.    Do not wear jewelry, make-up or nail polish.  Do not wear lotions, powders, or perfumes. Please wear deodorant and brush your teeth.  Do not shave 48 hours prior to surgery.  Men may shave face and neck.  Do not bring valuables to the hospital.  [facility] is not responsible for any belongings or valuables.  Contacts, dentures or bridgework may not be worn into surgery.  Leave your suitcase in the car.  After surgery it may be brought to your room.  For patients admitted to the hospital, discharge time will be determined by your treatment team.  Patients discharged the day of surgery will not be allowed to drive home.   Name and phone number of your driver:   family Special instructions:  DO NOT smoke the morning of your surgery.  Please read over the following fact sheets that you were given. Anesthesia Post-op Instructions and Care and Recovery After Surgery       Upper Endoscopy, Adult, Care After This sheet gives you information about how to care for yourself after your procedure. Your health care provider may also give you more specific instructions. If you have problems or questions, contact your health care provider. What can I expect after the procedure? After the procedure, it is common to have:  A sore throat.  Mild stomach pain or discomfort.  Bloating.  Nausea. Follow these instructions at home:   Follow instructions from your health care provider about what to eat or drink after your procedure.  Return to  your normal activities as told by your health care provider. Ask your health care provider what activities are safe for you.  Take over-the-counter and prescription medicines only as told by your health care provider.  Do not drive for 24 hours if you were given a sedative during your procedure.  Keep all follow-up visits as told by your health care provider. This is important. Contact a health care provider if you have:  A sore throat that lasts longer than one day.  Trouble swallowing. Get help right away if:  You vomit blood or your vomit looks like coffee grounds.  You have: ? A fever. ? Bloody, black, or tarry stools. ? A severe sore throat or you cannot swallow. ? Difficulty breathing. ? Severe pain in your chest or abdomen. Summary  After the procedure, it is common to have a sore throat, mild stomach discomfort, bloating, and nausea.  Do not drive for 24 hours if you were given a sedative during the procedure.  Follow instructions from your health care provider about what to eat or drink after your procedure.  Return to your normal activities as told by your health care provider. This information is not intended to replace advice given to you by your health care provider. Make sure you discuss any questions you have with your health care provider. Document  Revised: 09/29/2017 Document Reviewed: 09/07/2017 Elsevier Patient Education  [place].  Colonoscopy, Adult, Care After This sheet gives you information about how to care for yourself after your procedure. Your health care provider may also give you more specific instructions. If you have problems or questions, contact your health care provider. What can I expect after the procedure? After the procedure, it is common to have:  A small amount of blood in your stool for 24 hours after the procedure.  Some gas.  Mild cramping or bloating of your abdomen. Follow these instructions at home: Eating and  drinking   Drink enough fluid to keep your urine pale yellow.  Follow instructions from your health care provider about eating or drinking restrictions.  Resume your normal diet as instructed by your health care provider. Avoid heavy or fried foods that are hard to digest. Activity  Rest as told by your health care provider.  Avoid sitting for a long time without moving. Get up to take short walks every 1-2 hours. This is important to improve blood flow and breathing. Ask for help if you feel weak or unsteady.  Return to your normal activities as told by your health care provider. Ask your health care provider what activities are safe for you. Managing cramping and bloating   Try walking around when you have cramps or feel bloated.  Apply heat to your abdomen as told by your health care provider. Use the heat source that your health care provider recommends, such as a moist heat pack or a heating pad. ? Place a towel between your skin and the heat source. ? Leave the heat on for 20-30 minutes. ? Remove the heat if your skin turns bright red. This is especially important if you are unable to feel pain, heat, or cold. You may have a greater risk of getting burned. General instructions  For the first 24 hours after the procedure: ? Do not drive or use machinery. ? Do not sign important documents. ? Do not drink alcohol. ? Do your regular daily activities at a slower pace than normal. ? Eat soft foods that are easy to digest.  Take over-the-counter and prescription medicines only as told by your health care provider.  Keep all follow-up visits as told by your health care provider. This is important. Contact a health care provider if:  You have blood in your stool 2-3 days after the procedure. Get help right away if you have:  More than a small spotting of blood in your stool.  Large blood clots in your stool.  Swelling of your abdomen.  Nausea or vomiting.  A  fever.  Increasing pain in your abdomen that is not relieved with medicine. Summary  After the procedure, it is common to have a small amount of blood in your stool. You may also have mild cramping and bloating of your abdomen.  For the first 24 hours after the procedure, do not drive or use machinery, sign important documents, or drink alcohol.  Get help right away if you have a lot of blood in your stool, nausea or vomiting, a fever, or increased pain in your abdomen. This information is not intended to replace advice given to you by your health care provider. Make sure you discuss any questions you have with your health care provider. Document Revised: 11/01/2018 Document Reviewed: 11/01/2018 Elsevier Patient Education  [place] After These instructions provide you with information about caring for yourself  after your procedure. Your health care provider may also give you more specific instructions. Your treatment has been planned according to current medical practices, but problems sometimes occur. Call your health care provider if you have any problems or questions after your procedure. What can I expect after the procedure? After your procedure, you may:  Feel sleepy for several hours.  Feel clumsy and have poor balance for several hours.  Feel forgetful about what happened after the procedure.  Have poor judgment for several hours.  Feel nauseous or vomit.  Have a sore throat if you had a breathing tube during the procedure. Follow these instructions at home: For at least 24 hours after the procedure:      Have a responsible adult stay with you. It is important to have someone help care for you until you are awake and alert.  Rest as needed.  Do not: ? Participate in activities in which you could fall or become injured. ? Drive. ? Use heavy machinery. ? Drink alcohol. ? Take sleeping pills or medicines that cause  drowsiness. ? Make important decisions or sign legal documents. ? Take care of children on your own. Eating and drinking  Follow the diet that is recommended by your health care provider.  If you vomit, drink water, juice, or soup when you can drink without vomiting.  Make sure you have little or no nausea before eating solid foods. General instructions  Take over-the-counter and prescription medicines only as told by your health care provider.  If you have sleep apnea, surgery and certain medicines can increase your risk for breathing problems. Follow instructions from your health care provider about wearing your sleep device: ? Anytime you are sleeping, including during daytime naps. ? While taking prescription pain medicines, sleeping medicines, or medicines that make you drowsy.  If you smoke, do not smoke without supervision.  Keep all follow-up visits as told by your health care provider. This is important. Contact a health care provider if:  You keep feeling nauseous or you keep vomiting.  You feel light-headed.  You develop a rash.  You have a fever. Get help right away if:  You have trouble breathing. Summary  For several hours after your procedure, you may feel sleepy and have poor judgment.  Have a responsible adult stay with you for at least 24 hours or until you are awake and alert. This information is not intended to replace advice given to you by your health care provider. Make sure you discuss any questions you have with your health care provider. Document Revised: 07/06/2017 Document Reviewed: 07/29/2015 Elsevier Patient Education  [place].

## 2019-07-26 ENCOUNTER — Other Ambulatory Visit: Payer: Self-pay

## 2019-07-26 ENCOUNTER — Other Ambulatory Visit (HOSPITAL_COMMUNITY)
Admission: RE | Admit: 2019-07-26 | Discharge: 2019-07-26 | Disposition: A | Discharge status: Home / Self Care | Payer: Medicaid Other | Source: Ambulatory Visit | Attending: Internal Medicine | Admitting: Internal Medicine

## 2019-07-26 ENCOUNTER — Encounter (HOSPITAL_COMMUNITY)
Admission: RE | Admit: 2019-07-26 | Discharge: 2019-07-26 | Disposition: A | Discharge status: Home / Self Care | Payer: Medicaid Other | Source: Ambulatory Visit | Attending: Internal Medicine | Admitting: Internal Medicine

## 2019-07-26 DIAGNOSIS — Z20822 Contact with and (suspected) exposure to covid-19: Secondary | ICD-10-CM | POA: Insufficient documentation

## 2019-07-26 DIAGNOSIS — Z01812 Encounter for preprocedural laboratory examination: Secondary | ICD-10-CM | POA: Diagnosis not present

## 2019-07-26 LAB — BASIC METABOLIC PANEL
Anion gap: 10 (ref 5–15)
BUN: 7 mg/dL (ref 6–20)
CO2: 25 mmol/L (ref 22–32)
Calcium: 8.5 mg/dL — ABNORMAL LOW (ref 8.9–10.3)
Chloride: 104 mmol/L (ref 98–111)
Creatinine, Ser: 0.76 mg/dL (ref 0.44–1.00)
GFR calc Af Amer: >60 mL/min (ref >60)
GFR calc non Af Amer: >60 mL/min (ref >60)
Glucose, Bld: 95 mg/dL (ref 70–99)
Potassium: 4.8 mmol/L (ref 3.5–5.1)
Sodium: 139 mmol/L (ref 135–145)

## 2019-07-26 LAB — CBC WITH DIFFERENTIAL/PLATELET
Abs Immature Granulocytes: 0.05 10*3/uL (ref 0.00–0.07)
Basophils Absolute: 0 10*3/uL (ref 0.0–0.1)
Basophils Relative: 0 %
Eosinophils Absolute: 0.1 10*3/uL (ref 0.0–0.5)
Eosinophils Relative: 1 %
HCT: 43 % (ref 36.0–46.0)
Hemoglobin: 13.7 g/dL (ref 12.0–15.0)
Immature Granulocytes: 1 %
Lymphocytes Relative: 18 %
Lymphs Abs: 1.9 10*3/uL (ref 0.7–4.0)
MCH: 32 pg (ref 26.0–34.0)
MCHC: 31.9 g/dL (ref 30.0–36.0)
MCV: 100.5 fL — ABNORMAL HIGH (ref 80.0–100.0)
Monocytes Absolute: 0.6 10*3/uL (ref 0.1–1.0)
Monocytes Relative: 6 %
Neutro Abs: 7.6 10*3/uL (ref 1.7–7.7)
Neutrophils Relative %: 74 %
Platelets: 338 10*3/uL (ref 150–400)
RBC: 4.28 MIL/uL (ref 3.87–5.11)
RDW: 15.9 % — ABNORMAL HIGH (ref 11.5–15.5)
WBC: 10.2 10*3/uL (ref 4.0–10.5)
nRBC: 0 % (ref 0.0–0.2)

## 2019-07-26 LAB — HCG, SERUM, QUALITATIVE: Preg, Serum: NEGATIVE

## 2019-07-27 LAB — SARS CORONAVIRUS 2 (TAT 6-24 HRS): SARS Coronavirus 2: NEGATIVE

## 2019-07-29 ENCOUNTER — Ambulatory Visit (HOSPITAL_COMMUNITY)
Admission: RE | Admit: 2019-07-29 | Discharge: 2019-07-29 | Disposition: A | Discharge status: Home / Self Care | Payer: Medicaid Other | Attending: Internal Medicine | Admitting: Internal Medicine

## 2019-07-29 ENCOUNTER — Ambulatory Visit (HOSPITAL_COMMUNITY): Payer: Medicaid Other | Admitting: Anesthesiology

## 2019-07-29 ENCOUNTER — Encounter (HOSPITAL_COMMUNITY): Payer: Self-pay | Admitting: Internal Medicine

## 2019-07-29 ENCOUNTER — Encounter (HOSPITAL_COMMUNITY)
Admission: RE | Disposition: A | Discharge status: Home / Self Care | Payer: Self-pay | Source: Home / Self Care | Attending: Internal Medicine

## 2019-07-29 DIAGNOSIS — E039 Hypothyroidism, unspecified: Secondary | ICD-10-CM | POA: Diagnosis not present

## 2019-07-29 DIAGNOSIS — K5730 Diverticulosis of large intestine without perforation or abscess without bleeding: Secondary | ICD-10-CM | POA: Diagnosis not present

## 2019-07-29 DIAGNOSIS — Z881 Allergy status to other antibiotic agents status: Secondary | ICD-10-CM | POA: Insufficient documentation

## 2019-07-29 DIAGNOSIS — F17210 Nicotine dependence, cigarettes, uncomplicated: Secondary | ICD-10-CM | POA: Insufficient documentation

## 2019-07-29 DIAGNOSIS — R1011 Right upper quadrant pain: Secondary | ICD-10-CM | POA: Diagnosis not present

## 2019-07-29 DIAGNOSIS — Z825 Family history of asthma and other chronic lower respiratory diseases: Secondary | ICD-10-CM | POA: Diagnosis not present

## 2019-07-29 DIAGNOSIS — E669 Obesity, unspecified: Secondary | ICD-10-CM | POA: Insufficient documentation

## 2019-07-29 DIAGNOSIS — K644 Residual hemorrhoidal skin tags: Secondary | ICD-10-CM | POA: Insufficient documentation

## 2019-07-29 DIAGNOSIS — Z833 Family history of diabetes mellitus: Secondary | ICD-10-CM | POA: Diagnosis not present

## 2019-07-29 DIAGNOSIS — Z800 Family history of malignant neoplasm of digestive organs: Secondary | ICD-10-CM | POA: Insufficient documentation

## 2019-07-29 DIAGNOSIS — Z79899 Other long term (current) drug therapy: Secondary | ICD-10-CM | POA: Diagnosis not present

## 2019-07-29 DIAGNOSIS — R112 Nausea with vomiting, unspecified: Secondary | ICD-10-CM | POA: Diagnosis not present

## 2019-07-29 DIAGNOSIS — Z8249 Family history of ischemic heart disease and other diseases of the circulatory system: Secondary | ICD-10-CM | POA: Diagnosis not present

## 2019-07-29 DIAGNOSIS — Z09 Encounter for follow-up examination after completed treatment for conditions other than malignant neoplasm: Secondary | ICD-10-CM

## 2019-07-29 DIAGNOSIS — R0989 Other specified symptoms and signs involving the circulatory and respiratory systems: Secondary | ICD-10-CM | POA: Diagnosis not present

## 2019-07-29 DIAGNOSIS — Z86010 Personal history of colonic polyps: Secondary | ICD-10-CM | POA: Diagnosis not present

## 2019-07-29 DIAGNOSIS — Z1211 Encounter for screening for malignant neoplasm of colon: Secondary | ICD-10-CM | POA: Insufficient documentation

## 2019-07-29 DIAGNOSIS — Z791 Long term (current) use of non-steroidal anti-inflammatories (NSAID): Secondary | ICD-10-CM | POA: Diagnosis not present

## 2019-07-29 DIAGNOSIS — K219 Gastro-esophageal reflux disease without esophagitis: Secondary | ICD-10-CM | POA: Diagnosis not present

## 2019-07-29 DIAGNOSIS — Z809 Family history of malignant neoplasm, unspecified: Secondary | ICD-10-CM | POA: Insufficient documentation

## 2019-07-29 DIAGNOSIS — R1013 Epigastric pain: Secondary | ICD-10-CM | POA: Diagnosis not present

## 2019-07-29 DIAGNOSIS — J45909 Unspecified asthma, uncomplicated: Secondary | ICD-10-CM | POA: Diagnosis not present

## 2019-07-29 DIAGNOSIS — K5732 Diverticulitis of large intestine without perforation or abscess without bleeding: Secondary | ICD-10-CM

## 2019-07-29 HISTORY — PX: ESOPHAGOGASTRODUODENOSCOPY (EGD) WITH PROPOFOL: SHX5813

## 2019-07-29 HISTORY — PX: COLONOSCOPY WITH PROPOFOL: SHX5780

## 2019-07-29 SURGERY — COLONOSCOPY WITH PROPOFOL
Anesthesia: General

## 2019-07-29 MED ORDER — POLYETHYLENE GLYCOL 3350 17 GM/SCOOP PO POWD: 17.0000 g | Freq: Every day | ORAL | 1 refills | Status: DC

## 2019-07-29 MED ORDER — STERILE WATER FOR IRRIGATION IR SOLN
Status: DC | PRN
  Administered 2019-07-29: 1.5 mL

## 2019-07-29 MED ORDER — LACTATED RINGERS IV SOLN
Freq: Once | INTRAVENOUS | Status: AC
  Administered 2019-07-29: 1000 mL via INTRAVENOUS

## 2019-07-29 MED ORDER — LIDOCAINE 2% (20 MG/ML) 5 ML SYRINGE
INTRAMUSCULAR | Status: AC
  Filled 2019-07-29: qty 5

## 2019-07-29 MED ORDER — LACTATED RINGERS IV SOLN: INTRAVENOUS | Status: DC | PRN

## 2019-07-29 MED ORDER — PROPOFOL 10 MG/ML IV BOLUS
INTRAVENOUS | Status: DC | PRN
  Administered 2019-07-29 (×3): 20 mg via INTRAVENOUS

## 2019-07-29 MED ORDER — KETAMINE HCL 50 MG/5ML IJ SOSY
PREFILLED_SYRINGE | INTRAMUSCULAR | Status: AC
  Filled 2019-07-29: qty 5

## 2019-07-29 MED ORDER — PROPOFOL 10 MG/ML IV BOLUS: INTRAVENOUS | Status: DC | PRN

## 2019-07-29 MED ORDER — KETAMINE HCL 10 MG/ML IJ SOLN
INTRAMUSCULAR | Status: DC | PRN
  Administered 2019-07-29: 10 mg via INTRAVENOUS
  Administered 2019-07-29: 20 mg via INTRAVENOUS

## 2019-07-29 MED ORDER — LIDOCAINE HCL (CARDIAC) PF 100 MG/5ML IV SOSY
PREFILLED_SYRINGE | INTRAVENOUS | Status: DC | PRN
  Administered 2019-07-29: 100 mg via INTRAVENOUS

## 2019-07-29 MED ORDER — GLYCOPYRROLATE 0.2 MG/ML IJ SOLN
INTRAMUSCULAR | Status: DC | PRN
  Administered 2019-07-29: .2 mg via INTRAVENOUS

## 2019-07-29 MED ORDER — CHLORHEXIDINE GLUCONATE CLOTH 2 % EX PADS: 6.0000 | MEDICATED_PAD | Freq: Once | CUTANEOUS | Status: DC

## 2019-07-29 MED ORDER — PROPOFOL 500 MG/50ML IV EMUL
INTRAVENOUS | Status: DC | PRN
  Administered 2019-07-29: 150 ug/kg/min via INTRAVENOUS

## 2019-07-29 MED ORDER — METAMUCIL SMOOTH TEXTURE 58.6 % PO POWD: 1.0000 | Freq: Every day | ORAL | 12 refills | Status: DC

## 2019-07-29 NOTE — Op Note (Signed)
[facility] Patient Name: Amy [name] Procedure Date: 07/29/2019 12:21 PM MRN: [id number] Date of Birth: 1975-08-31 Attending MD: Hildred Laser , MD CSN: [id number] Age: 44 Admit Type: Outpatient Procedure:                Colonoscopy Indications:              High risk colon cancer surveillance: Personal                            history of colonic polyps, Family history of colon                            cancer in a first-degree relative before age 84                            years Providers:                Hildred Laser, MD, Charlsie Quest. Theda Sers RN, RN,                            Raphael Gibney, Technician Referring MD:              Medicines:                Propofol per Anesthesia Complications:            No immediate complications. Estimated Blood Loss:     Estimated blood loss: none. Procedure:                Pre-Anesthesia Assessment:                           - Prior to the procedure, a History and Physical                            was performed, and patient medications and                            allergies were reviewed. The patient's tolerance of                            previous anesthesia was also reviewed. The risks                            and benefits of the procedure and the sedation                            options and risks were discussed with the patient.                            All questions were answered, and informed consent                            was obtained. Prior Anticoagulants: The patient has                            taken  no previous anticoagulant or antiplatelet                            agents except for NSAID medication. ASA Grade                            Assessment: III - A patient with severe systemic                            disease. After reviewing the risks and benefits,                            the patient was deemed in satisfactory condition to                            undergo the procedure.           - Prior to the procedure, a History and Physical                            was performed, and patient medications and                            allergies were reviewed. The patient's tolerance of                            previous anesthesia was also reviewed. The risks                            and benefits of the procedure and the sedation                            options and risks were discussed with the patient.                            All questions were answered, and informed consent                            was obtained. Prior Anticoagulants: The patient has                            taken no previous anticoagulant or antiplatelet                            agents except for NSAID medication. ASA Grade                            Assessment: III - A patient with severe systemic                            disease. After reviewing the risks and benefits,                            the patient was deemed in satisfactory  condition to                            undergo the procedure.                           After obtaining informed consent, the colonoscope                            was passed under direct vision. Throughout the                            procedure, the patient's blood pressure, pulse, and                            oxygen saturations were monitored continuously. The                            [id number]-[id number] scope was introduced through                            the anus and advanced to the the cecum, identified                            by appendiceal orifice and ileocecal valve. The                            colonoscopy was performed without difficulty. The                            patient tolerated the procedure well. The quality                            of the bowel preparation was marginal. The                            ileocecal valve, appendiceal orifice, and rectum                            were photographed. Scope In: 12:23:51  PM Scope Out: 12:38:05 PM Scope Withdrawal Time: 0 hours 8 minutes 23 seconds  Total Procedure Duration: 0 hours 14 minutes 14 seconds  Findings:      The perianal and digital rectal examinations were normal.      A few diverticula were found in the sigmoid colon.      The exam was otherwise normal throughout the examined colon.      External hemorrhoids were found during retroflexion. The hemorrhoids       were small. Impression:               - Diverticulosis in the sigmoid colon.                           - External hemorrhoids.                           -  No specimens collected. Moderate Sedation:      Per Anesthesia Care Recommendation:           - Patient has a contact number available for                            emergencies. The signs and symptoms of potential                            delayed complications were discussed with the                            patient. Return to normal activities tomorrow.                            Written discharge instructions were provided to the                            patient.                           - High fiber diet today.                           - Continue present medications.                           - take Miralax daily instead of as needed.                           - Metamucil one tablespoonful po qhs.                           - Repeat colonoscopy in 5 months for surveillance. Procedure Code(s):        --- Professional ---                           [phone], Colonoscopy, flexible; diagnostic, including                            collection of specimen(s) by brushing or washing,                            when performed (separate procedure) Diagnosis Code(s):        --- Professional ---                           Z86.010, Personal history of colonic polyps                           K64.4, Residual hemorrhoidal skin tags                           Z80.0, Family history of malignant neoplasm of                             digestive  organs                           K57.30, Diverticulosis of large intestine without                            perforation or abscess without bleeding CPT copyright 2019 [employer]. All rights reserved. The codes documented in this report are preliminary and upon coder review may  be revised to meet current compliance requirements. Hildred Laser, MD Hildred Laser, MD 07/29/2019 1:02:39 PM This report has been signed electronically. Number of Addenda: 0

## 2019-07-29 NOTE — Discharge Instructions (Signed)
Take meloxicam on as-needed basis if possible. Do not take other OTC NSAIDs such as Advil Aleve BC Goody powder while you are on meloxicam.   Metamucil 1 packet or 1 heaping tablespoon daily at bedtime. Take polyethylene glycol on regular basis.  Can titrate dose between 8.5 to 17 g daily. Resume other medications as before. High-fiber diet. No driving for 24 hours. Will schedule abdominal ultrasound.  Office will call.  Colonoscopy, Adult, Care After This sheet gives you information about how to care for yourself after your procedure. Your doctor may also give you more specific instructions. If you have problems or questions, call your doctor. What can I expect after the procedure? After the procedure, it is common to have:  A small amount of blood in your poop (stool) for 24 hours.  Some gas.  Mild cramping or bloating in your belly (abdomen). Follow these instructions at home: Eating and drinking   Drink enough fluid to keep your pee (urine) pale yellow.  Follow instructions from your doctor about what you cannot eat or drink.  Return to your normal diet as told by your doctor. Avoid heavy or fried foods that are hard to digest. Activity  Rest as told by your doctor.  Do not sit for a long time without moving. Get up to take short walks every 1-2 hours. This is important. Ask for help if you feel weak or unsteady.  Return to your normal activities as told by your doctor. Ask your doctor what activities are safe for you. To help cramping and bloating:   Try walking around.  Put heat on your belly as told by your doctor. Use the heat source that your doctor recommends, such as a moist heat pack or a heating pad. ? Put a towel between your skin and the heat source. ? Leave the heat on for 20-30 minutes. ? Remove the heat if your skin turns bright red. This is very important if you are unable to feel pain, heat, or cold. You may have a greater risk of getting  burned. General instructions  For the first 24 hours after the procedure: ? Do not drive or use machinery. ? Do not sign important documents. ? Do not drink alcohol. ? Do your daily activities more slowly than normal. ? Eat foods that are soft and easy to digest.  Take over-the-counter or prescription medicines only as told by your doctor.  Keep all follow-up visits as told by your doctor. This is important. Contact a doctor if:  You have blood in your poop 2-3 days after the procedure. Get help right away if:  You have more than a small amount of blood in your poop.  You see large clumps of tissue (blood clots) in your poop.  Your belly is swollen.  You feel like you may vomit (nauseous).  You vomit.  You have a fever.  You have belly pain that gets worse, and medicine does not help your pain. Summary  After the procedure, it is common to have a small amount of blood in your poop. You may also have mild cramping and bloating in your belly.  For the first 24 hours after the procedure, do not drive or use machinery, do not sign important documents, and do not drink alcohol.  Get help right away if you have a lot of blood in your poop, feel like you may vomit, have a fever, or have more belly pain. This information is not intended to replace advice  given to you by your health care provider. Make sure you discuss any questions you have with your health care provider. Document Revised: 11/01/2018 Document Reviewed: 11/01/2018 Elsevier Patient Education  [place].   High-Fiber Diet Fiber, also called dietary fiber, is a type of carbohydrate that is found in fruits, vegetables, whole grains, and beans. A high-fiber diet can have many health benefits. Your health care provider may recommend a high-fiber diet to help:  Prevent constipation. Fiber can make your bowel movements more regular.  Lower your cholesterol.  Relieve the following conditions: ? Swelling of  veins in the anus (hemorrhoids). ? Swelling and irritation (inflammation) of specific areas of the digestive tract (uncomplicated diverticulosis). ? A problem of the large intestine (colon) that sometimes causes pain and diarrhea (irritable bowel syndrome, IBS).  Prevent overeating as part of a weight-loss plan.  Prevent heart disease, type 2 diabetes, and certain cancers. What is my plan? The recommended daily fiber intake in grams (g) includes:  38 g for men age 90 or younger.  30 g for men over age 73.  37 g for women age 22 or younger.  21 g for women over age 21. You can get the recommended daily intake of dietary fiber by:  Eating a variety of fruits, vegetables, grains, and beans.  Taking a fiber supplement, if it is not possible to get enough fiber through your diet. What do I need to know about a high-fiber diet?  It is better to get fiber through food sources rather than from fiber supplements. There is not a lot of research about how effective supplements are.  Always check the fiber content on the nutrition facts label of any prepackaged food. Look for foods that contain 5 g of fiber or more per serving.  Talk with a diet and nutrition specialist (dietitian) if you have questions about specific foods that are recommended or not recommended for your medical condition, especially if those foods are not listed below.  Gradually increase how much fiber you consume. If you increase your intake of dietary fiber too quickly, you may have bloating, cramping, or gas.  Drink plenty of water. Water helps you to digest fiber. What are tips for following this plan?  Eat a wide variety of high-fiber foods.  Make sure that half of the grains that you eat each day are whole grains.  Eat breads and cereals that are made with whole-grain flour instead of refined flour or white flour.  Eat brown rice, bulgur wheat, or millet instead of white rice.  Start the day with a breakfast  that is high in fiber, such as a cereal that contains 5 g of fiber or more per serving.  Use beans in place of meat in soups, salads, and pasta dishes.  Eat high-fiber snacks, such as berries, raw vegetables, nuts, and popcorn.  Choose whole fruits and vegetables instead of processed forms like juice or sauce. What foods can I eat?  Fruits Berries. Pears. Apples. Oranges. Avocado. Prunes and raisins. Dried figs. Vegetables Sweet potatoes. Spinach. Kale. Artichokes. Cabbage. Broccoli. Cauliflower. Green peas. Carrots. Squash. Grains Whole-grain breads. Multigrain cereal. Oats and oatmeal. Brown rice. Barley. Bulgur wheat. [place]. Quinoa. Bran muffins. Popcorn. Rye wafer crackers. Meats and other proteins Navy, kidney, and pinto beans. Soybeans. Split peas. Lentils. Nuts and seeds. Dairy Fiber-fortified yogurt. Beverages Fiber-fortified soy milk. Fiber-fortified orange juice. Other foods Fiber bars. The items listed above may not be a complete list of recommended foods and beverages.  Contact a dietitian for more options. What foods are not recommended? Fruits Fruit juice. Cooked, strained fruit. Vegetables Fried potatoes. Canned vegetables. Well-cooked vegetables. Grains White bread. Pasta made with refined flour. White rice. Meats and other proteins Fatty cuts of meat. Fried chicken or fried fish. Dairy Milk. Yogurt. Cream cheese. Sour cream. Fats and oils Butters. Beverages Soft drinks. Other foods Cakes and pastries. The items listed above may not be a complete list of foods and beverages to avoid. Contact a dietitian for more information. Summary  Fiber is a type of carbohydrate. It is found in fruits, vegetables, whole grains, and beans.  There are many health benefits of eating a high-fiber diet, such as preventing constipation, lowering blood cholesterol, helping with weight loss, and reducing your risk of heart disease, diabetes, and certain cancers.  Gradually  increase your intake of fiber. Increasing too fast can result in cramping, bloating, and gas. Drink plenty of water while you increase your fiber.  The best sources of fiber include whole fruits and vegetables, whole grains, nuts, seeds, and beans. This information is not intended to replace advice given to you by your health care provider. Make sure you discuss any questions you have with your health care provider. Document Revised: 02/09/2017 Document Reviewed: 02/09/2017 Elsevier Patient Education  [place].   Diverticulosis  Diverticulosis is a condition that develops when small pouches (diverticula) form in the wall of the large intestine (colon). The colon is where water is absorbed and stool (feces) is formed. The pouches form when the inside layer of the colon pushes through weak spots in the outer layers of the colon. You may have a few pouches or many of them. The pouches usually do not cause problems unless they become inflamed or infected. When this happens, the condition is called diverticulitis. What are the causes? The cause of this condition is not known. What increases the risk? The following factors may make you more likely to develop this condition:  Being older than age 24. Your risk for this condition increases with age. Diverticulosis is rare among people younger than age 22. By age 74, many people have it.  Eating a low-fiber diet.  Having frequent constipation.  Being overweight.  Not getting enough exercise.  Smoking.  Taking over-the-counter pain medicines, like aspirin and ibuprofen.  Having a family history of diverticulosis. What are the signs or symptoms? In most people, there are no symptoms of this condition. If you do have symptoms, they may include:  Bloating.  Cramps in the abdomen.  Constipation or diarrhea.  Pain in the lower left side of the abdomen. How is this diagnosed? Because diverticulosis usually has no symptoms, it is  most often diagnosed during an exam for other colon problems. The condition may be diagnosed by:  Using a flexible scope to examine the colon (colonoscopy).  Taking an X-ray of the colon after dye has been put into the colon (barium enema).  Having a CT scan. How is this treated? You may not need treatment for this condition. Your health care provider may recommend treatment to prevent problems. You may need treatment if you have symptoms or if you previously had diverticulitis. Treatment may include:  Eating a high-fiber diet.  Taking a fiber supplement.  Taking a live bacteria supplement (probiotic).  Taking medicine to relax your colon. Follow these instructions at home: Medicines  Take over-the-counter and prescription medicines only as told by your health care provider.  If told by your health  care provider, take a fiber supplement or probiotic. Constipation prevention Your condition may cause constipation. To prevent or treat constipation, you may need to:  Drink enough fluid to keep your urine pale yellow.  Take over-the-counter or prescription medicines.  Eat foods that are high in fiber, such as beans, whole grains, and fresh fruits and vegetables.  Limit foods that are high in fat and processed sugars, such as fried or sweet foods.  General instructions  Try not to strain when you have a bowel movement.  Keep all follow-up visits as told by your health care provider. This is important. Contact a health care provider if you:  Have pain in your abdomen.  Have bloating.  Have cramps.  Have not had a bowel movement in 3 days. Get help right away if:  Your pain gets worse.  Your bloating becomes very bad.  You have a fever or chills, and your symptoms suddenly get worse.  You vomit.  You have bowel movements that are bloody or black.  You have bleeding from your rectum. Summary  Diverticulosis is a condition that develops when small pouches  (diverticula) form in the wall of the large intestine (colon).  You may have a few pouches or many of them.  This condition is most often diagnosed during an exam for other colon problems.  Treatment may include increasing the fiber in your diet, taking supplements, or taking medicines. This information is not intended to replace advice given to you by your health care provider. Make sure you discuss any questions you have with your health care provider. Document Revised: 11/04/2018 Document Reviewed: 11/04/2018 Elsevier Patient Education  [place].

## 2019-07-29 NOTE — Transfer of Care (Signed)
Immediate Anesthesia Transfer of Care Note  Patient: Amy [name]  Procedure(s) Performed: COLONOSCOPY WITH PROPOFOL (N/A ) ESOPHAGOGASTRODUODENOSCOPY (EGD) WITH PROPOFOL (N/A )  Patient Location: PACU  Anesthesia Type:MAC and General  Level of Consciousness: awake, alert , oriented and patient cooperative  Airway & Oxygen Therapy: Patient Spontanous Breathing  Post-op Assessment: Report given to RN and Post -op Vital signs reviewed and stable  Post vital signs: Reviewed and stable  Last Vitals:  Vitals Value Taken Time  BP    Temp    Pulse    Resp    SpO2      Last Pain:  Vitals:   07/29/19 1207  TempSrc:   PainSc: 0-No pain      Patients Stated Pain Goal: 9 (12/82/08 1388)  Complications: No apparent anesthesia complications

## 2019-07-29 NOTE — Anesthesia Procedure Notes (Addendum)
Date/Time: 07/29/2019 12:06 PM Performed by: Vista Deck, CRNA Pre-anesthesia Checklist: Patient identified, Emergency Drugs available, Suction available, Timeout performed and Patient being monitored Patient Re-evaluated:Patient Re-evaluated prior to induction Oxygen Delivery Method: Non-rebreather mask

## 2019-07-29 NOTE — Op Note (Signed)
[facility] Patient Name: Amy [name] Procedure Date: 07/29/2019 11:25 AM MRN: [id number] Date of Birth: 1975-12-13 Attending MD: Hildred Laser , MD CSN: [id number] Age: 44 Admit Type: Outpatient Procedure:                Upper GI endoscopy Indications:              Epigastric abdominal pain, Abdominal pain in the                            right upper quadrant, Nausea with vomiting Providers:                Hildred Laser, MD, Charlsie Quest. Theda Sers RN, RN,                            Raphael Gibney, Technician Referring MD:              Medicines:                Propofol per Anesthesia Complications:            No immediate complications. Estimated Blood Loss:     Estimated blood loss: none. Procedure:                Pre-Anesthesia Assessment:                           - Prior to the procedure, a History and Physical                            was performed, and patient medications and                            allergies were reviewed. The patient's tolerance of                            previous anesthesia was also reviewed. The risks                            and benefits of the procedure and the sedation                            options and risks were discussed with the patient.                            All questions were answered, and informed consent                            was obtained. Prior Anticoagulants: The patient has                            taken no previous anticoagulant or antiplatelet                            agents except for NSAID medication. ASA Grade  Assessment: III - A patient with severe systemic                            disease. After reviewing the risks and benefits,                            the patient was deemed in satisfactory condition to                            undergo the procedure.                           After obtaining informed consent, the endoscope was                            passed under  direct vision. Throughout the                            procedure, the patient's blood pressure, pulse, and                            oxygen saturations were monitored continuously. The                            [id number]-H190 [id number]) scope was introduced through the                            mouth, and advanced to the second part of duodenum.                            The upper GI endoscopy was accomplished without                            difficulty. The patient tolerated the procedure                            well. Scope In: 12:13:18 PM Scope Out: 12:17:58 PM Total Procedure Duration: 0 hours 4 minutes 40 seconds  Findings:      The examined esophagus was normal.      The Z-line was regular and was found 40 cm from the incisors.      The entire examined stomach was normal.      The duodenal bulb and second portion of the duodenum were normal. Impression:               - Normal esophagus.                           - Z-line regular, 40 cm from the incisors.                           - Normal stomach.                           - Normal duodenal bulb and second portion of the  duodenum.                           - No specimens collected. Moderate Sedation:      Per Anesthesia Care Recommendation:           - Patient has a contact number available for                            emergencies. The signs and symptoms of potential                            delayed complications were discussed with the                            patient. Return to normal activities tomorrow.                            Written discharge instructions were provided to the                            patient.                           - Resume previous diet today.                           - Continue present medications.                           - Perform an abdominal ultrasound at appointment to                            be scheduled. Procedure Code(s):        --- Professional  ---                           [phone], Esophagogastroduodenoscopy, flexible,                            transoral; diagnostic, including collection of                            specimen(s) by brushing or washing, when performed                            (separate procedure) Diagnosis Code(s):        --- Professional ---                           R10.13, Epigastric pain                           R10.11, Right upper quadrant pain                           R11.2, Nausea with vomiting, unspecified CPT copyright 2019 American Medical Association. All rights reserved. The codes documented in this  report are preliminary and upon coder review may  be revised to meet current compliance requirements. Hildred Laser, MD Hildred Laser, MD 07/29/2019 12:53:47 PM This report has been signed electronically. Number of Addenda: 0

## 2019-07-29 NOTE — Anesthesia Postprocedure Evaluation (Signed)
Anesthesia Post Note  Patient: Amy [name]  Procedure(s) Performed: COLONOSCOPY WITH PROPOFOL (N/A ) ESOPHAGOGASTRODUODENOSCOPY (EGD) WITH PROPOFOL (N/A )  Patient location during evaluation: PACU Anesthesia Type: General and MAC Level of consciousness: awake and alert Pain management: pain level controlled Vital Signs Assessment: post-procedure vital signs reviewed and stable Respiratory status: spontaneous breathing Cardiovascular status: stable Postop Assessment: no apparent nausea or vomiting Anesthetic complications: no     Last Vitals:  Vitals:   07/29/19 1131 07/29/19 1146  BP: (!) 141/101 (!) 107/56  Pulse: 91   Resp: 20   Temp: 37.1 C   SpO2: 95%     Last Pain:  Vitals:   07/29/19 1207  TempSrc:   PainSc: 0-No pain                 Everette Rank

## 2019-07-29 NOTE — H&P (Signed)
Amy [name] is an 44 y.o. female.   Chief Complaint: Patient is here for esophagogastroduodenoscopy and colonoscopy. HPI: Patient is 44 year old Afro-American female who presents with 6-year history of epigastric and right upper quadrant pain radiating posteriorly.  She has had multiple studies including ultrasound CT scan CAT scan.  She has been treated with PPI which has not helped.  She states she was told she had ulcer when she was a teenager.  This diagnosis was based on her symptoms.  She has regurgitation usually at night but denies hematemesis or melena.  She also has difficulty with greasy foods which resulted in worsening pain and nausea vomiting.  Her last colonoscopy was in May 2015 with removal of 3 polyps and one was sessile serrated polyp.  Family history significant for colon carcinoma in biologic father who died in his 67s.  She did not know him.  She has been treated for diverticulitis on multiple occasions.  She is prone to constipation.  She takes MiraLAX on as-needed basis and also eat fiber rich foods.  She takes meloxicam daily and Aleve sporadically.  She denies rectal bleeding.  Past Medical History:  Diagnosis Date  . Acid reflux   . Diverticulosis   . GERD (gastroesophageal reflux disease)   . HSV-2 (herpes simplex virus 2) infection   . Hx of chlamydia infection   . Nexplanon insertion 02/07/2014   Inserted left arm 02/07/14  . Nexplanon removal 02/07/2014  . Obesity   . Pain in right hip 08/09/2012  . Reflux 01/19/2014  . Thyroid disease     Past Surgical History:  Procedure Laterality Date  . CESAREAN SECTION    . COLONOSCOPY N/A 09/01/2013   Procedure: COLONOSCOPY;  Surgeon: Rogene Houston, MD;  Location: [facility];  Service: Endoscopy;  Laterality: N/A;  100    Family History  Problem Relation Age of Onset  . Hypertension Mother   . Diabetes Mother   . COPD Mother   . Hypertension Sister   . Cancer Maternal Grandfather   . Cancer Father     Social History:  reports that she has been smoking cigarettes. She has been smoking about 0.50 packs per day. She has never used smokeless tobacco. She reports previous alcohol use. She reports that she does not use drugs.  Allergies:  Allergies  Allergen Reactions  . Keflex [Cephalexin] Itching and Rash    Medications Prior to Admission  Medication Sig Dispense Refill  . albuterol (PROVENTIL HFA;VENTOLIN HFA) 108 (90 Base) MCG/ACT inhaler Inhale 2 puffs into the lungs every 6 (six) hours as needed for wheezing or shortness of breath. 1 Inhaler 2  . escitalopram (LEXAPRO) 20 MG tablet Take 30 mg by mouth daily.    Marland Kitchen levothyroxine (SYNTHROID) 50 MCG tablet Take 50 mcg by mouth daily before breakfast.    . meclizine (ANTIVERT) 25 MG tablet Take 25 mg by mouth 3 (three) times daily as needed for dizziness.    . meloxicam (MOBIC) 15 MG tablet Take 15 mg by mouth daily.    . naproxen sodium (ALEVE) 220 MG tablet Take 220 mg by mouth daily as needed (pain).    . pantoprazole (PROTONIX) 40 MG tablet Take 40 mg by mouth daily.     . polyethylene glycol powder (MIRALAX) 17 GM/SCOOP powder Take 17 g by mouth daily. (Patient taking differently: Take 17 g by mouth daily as needed for mild constipation. ) 850 g 1  . etonogestrel (IMPLANON) 68 MG IMPL implant Inject  1 each into the skin once.    Marland Kitchen MELATONIN PO Take 1 tablet by mouth at bedtime as needed (sleep).    Marland Kitchen tiZANidine (ZANAFLEX) 2 MG tablet Take by mouth every 8 (eight) hours as needed for muscle spasms.      No results found for this or any previous visit (from the past 48 hour(s)). No results found.  Review of Systems  Blood pressure (!) 107/56, pulse 91, temperature 98.7 F (37.1 C), temperature source Oral, resp. rate 20, SpO2 95 %. Physical Exam  Constitutional:  Well-developed obese female in NAD.  HENT:  Mouth/Throat: Oropharynx is clear and moist.  Eyes: Conjunctivae are normal. No scleral icterus.  Neck: No thyromegaly  present.  Cardiovascular: Normal rate, regular rhythm and normal heart sounds.  No murmur heard. Respiratory: Effort normal and breath sounds normal.  GI:  Abdomen is protuberant.  On palpation is soft.  She has mild tenderness in epigastric region right upper quadrant.  No organomegaly or masses.  Musculoskeletal:        General: No edema.  Lymphadenopathy:    She has no cervical adenopathy.  Skin: Skin is warm and dry.     Assessment/Plan Epigastric/right upper quadrant abdominal pain and nausea. History of colonic polyps. Family history of CRC in first-degree relative younger than 18. Diagnostic EGD and surveillance colonoscopy.  Hildred Laser, MD 07/29/2019, 12:03 PM

## 2019-07-29 NOTE — Anesthesia Preprocedure Evaluation (Signed)
Anesthesia Evaluation  Patient identified by MRN, date of birth, ID band Patient awake    Reviewed: Allergy & Precautions, NPO status , Patient's Chart, lab work & pertinent test results  Airway Mallampati: II  TM Distance: >3 FB Neck ROM: Full    Dental  (+) Missing, Dental Advisory Given   Pulmonary asthma (bronchitis) , neg pneumonia , Current Smoker and Patient abstained from smoking.,    breath sounds clear to auscultation       Cardiovascular Exercise Tolerance: Good Normal cardiovascular exam Rhythm:Regular Rate:Normal - Systolic murmurs, - Diastolic murmurs, - Friction Rub, - Carotid Bruit, - Peripheral Edema and - Systolic Click    Neuro/Psych negative neurological ROS  negative psych ROS   GI/Hepatic Neg liver ROS, GERD  Medicated,  Endo/Other  Hypothyroidism Morbid obesity  Renal/GU negative Renal ROS  negative genitourinary   Musculoskeletal negative musculoskeletal ROS (+)   Abdominal (+) + obese,   Peds negative pediatric ROS (+)  Hematology negative hematology ROS (+)   Anesthesia Other Findings   Reproductive/Obstetrics negative OB ROS                            Anesthesia Physical Anesthesia Plan  ASA: III  Anesthesia Plan: General   Post-op Pain Management:    Induction: Intravenous  PONV Risk Score and Plan: 2 and TIVA and Treatment may vary due to age or medical condition  Airway Management Planned: Nasal Cannula, Natural Airway and Simple Face Mask  Additional Equipment:   Intra-op Plan:   Post-operative Plan:   Informed Consent: I have reviewed the patients History and Physical, chart, labs and discussed the procedure including the risks, benefits and alternatives for the proposed anesthesia with the patient or authorized representative who has indicated his/her understanding and acceptance.     Dental advisory given  Plan Discussed with:  CRNA  Anesthesia Plan Comments:         Anesthesia Quick Evaluation

## 2019-08-02 ENCOUNTER — Other Ambulatory Visit (INDEPENDENT_AMBULATORY_CARE_PROVIDER_SITE_OTHER): Payer: Self-pay | Admitting: *Deleted

## 2019-08-02 DIAGNOSIS — R112 Nausea with vomiting, unspecified: Secondary | ICD-10-CM

## 2019-08-02 DIAGNOSIS — G8929 Other chronic pain: Secondary | ICD-10-CM

## 2019-08-05 ENCOUNTER — Other Ambulatory Visit: Payer: Self-pay

## 2019-08-05 ENCOUNTER — Ambulatory Visit (HOSPITAL_COMMUNITY)
Admission: RE | Admit: 2019-08-05 | Discharge: 2019-08-05 | Disposition: A | Discharge status: Home / Self Care | Payer: Medicaid Other | Source: Ambulatory Visit | Attending: Internal Medicine | Admitting: Internal Medicine

## 2019-08-05 DIAGNOSIS — R1013 Epigastric pain: Secondary | ICD-10-CM | POA: Diagnosis present

## 2019-08-05 DIAGNOSIS — R112 Nausea with vomiting, unspecified: Secondary | ICD-10-CM | POA: Insufficient documentation

## 2019-08-05 DIAGNOSIS — G8929 Other chronic pain: Secondary | ICD-10-CM | POA: Insufficient documentation

## 2019-08-09 ENCOUNTER — Other Ambulatory Visit (INDEPENDENT_AMBULATORY_CARE_PROVIDER_SITE_OTHER): Payer: Self-pay | Admitting: *Deleted

## 2019-08-09 DIAGNOSIS — R1013 Epigastric pain: Secondary | ICD-10-CM

## 2019-08-09 DIAGNOSIS — R112 Nausea with vomiting, unspecified: Secondary | ICD-10-CM

## 2019-08-09 NOTE — Addendum Note (Signed)
Addended by: Grayland Ormond on: 08/09/2019 09:31 AM   Modules accepted: Orders

## 2019-08-15 ENCOUNTER — Telehealth (HOSPITAL_COMMUNITY): Payer: Medicaid Other | Admitting: Psychiatry

## 2019-08-24 ENCOUNTER — Other Ambulatory Visit: Payer: Self-pay

## 2019-08-24 ENCOUNTER — Other Ambulatory Visit: Payer: Self-pay | Admitting: Anesthesiology

## 2019-08-24 ENCOUNTER — Ambulatory Visit
Admission: RE | Admit: 2019-08-24 | Discharge: 2019-08-24 | Disposition: A | Discharge status: Home / Self Care | Payer: Medicaid Other | Source: Ambulatory Visit | Attending: Anesthesiology | Admitting: Anesthesiology

## 2019-08-24 DIAGNOSIS — G8929 Other chronic pain: Secondary | ICD-10-CM

## 2019-08-24 DIAGNOSIS — M47816 Spondylosis without myelopathy or radiculopathy, lumbar region: Secondary | ICD-10-CM

## 2019-08-24 DIAGNOSIS — R0600 Dyspnea, unspecified: Secondary | ICD-10-CM

## 2019-08-24 DIAGNOSIS — M4724 Other spondylosis with radiculopathy, thoracic region: Secondary | ICD-10-CM

## 2019-09-05 ENCOUNTER — Other Ambulatory Visit: Payer: Self-pay

## 2019-09-05 ENCOUNTER — Emergency Department (HOSPITAL_COMMUNITY)
Admission: EM | Admit: 2019-09-05 | Discharge: 2019-09-05 | Disposition: A | Discharge status: Home / Self Care | Payer: Medicaid Other | Attending: Emergency Medicine | Admitting: Emergency Medicine

## 2019-09-05 ENCOUNTER — Emergency Department (HOSPITAL_COMMUNITY): Payer: Medicaid Other

## 2019-09-05 ENCOUNTER — Encounter (HOSPITAL_COMMUNITY): Payer: Self-pay | Admitting: *Deleted

## 2019-09-05 DIAGNOSIS — M546 Pain in thoracic spine: Secondary | ICD-10-CM | POA: Diagnosis present

## 2019-09-05 DIAGNOSIS — G8929 Other chronic pain: Secondary | ICD-10-CM | POA: Diagnosis not present

## 2019-09-05 DIAGNOSIS — F17210 Nicotine dependence, cigarettes, uncomplicated: Secondary | ICD-10-CM | POA: Insufficient documentation

## 2019-09-05 DIAGNOSIS — Z79899 Other long term (current) drug therapy: Secondary | ICD-10-CM | POA: Diagnosis not present

## 2019-09-05 MED ORDER — OXYCODONE-ACETAMINOPHEN 5-325 MG PO TABS
2.0000 | ORAL_TABLET | Freq: Once | ORAL | Status: AC
  Administered 2019-09-05: 2 via ORAL
  Filled 2019-09-05: qty 2

## 2019-09-05 MED ORDER — HYDROCODONE-ACETAMINOPHEN 5-325 MG PO TABS: 1.0000 | ORAL_TABLET | ORAL | 0 refills | Status: DC | PRN

## 2019-09-05 MED ORDER — PREDNISONE 10 MG PO TABS: ORAL_TABLET | ORAL | 0 refills | Status: DC

## 2019-09-05 NOTE — ED Triage Notes (Signed)
Pt c/o mid back pain that radiates to under her right rib cage; pt has been seeing a orthopedic for the back pain

## 2019-09-05 NOTE — Discharge Instructions (Addendum)
Follow up with your Orthopaedist for evaluation.  Your Primary care MD needs to repeat a chest xray on you in 1 month.

## 2019-09-05 NOTE — ED Provider Notes (Signed)
[facility] EMERGENCY DEPARTMENT Provider Note   CSN: [id number] Arrival date & time: 09/05/19  1054     History Chief Complaint  Patient presents with  . Back Pain    Amy [name] is a 44 y.o. female.  The history is provided by the patient. No language interpreter was used.  Back Pain Location:  Thoracic spine Quality:  Aching Radiates to:  Does not radiate Pain severity:  Moderate Pain is:  Same all the time Onset quality:  Gradual Duration:  12 months Timing:  Constant Progression:  Worsening Chronicity:  New Context: not falling and not recent injury   Relieved by:  Nothing Worsened by:  Nothing Ineffective treatments:  None tried Associated symptoms: no numbness   Pt complains of pain in her mid upper right back.  Pt reports she has seen a spine doctor at [facility] at[facility] had injections.  Pt reports today pain is worse.      Past Medical History:  Diagnosis Date  . Acid reflux   . Diverticulosis   . GERD (gastroesophageal reflux disease)   . HSV-2 (herpes simplex virus 2) infection   . Hx of chlamydia infection   . Nexplanon insertion 02/07/2014   Inserted left arm 02/07/14  . Nexplanon removal 02/07/2014  . Obesity   . Pain in right hip 08/09/2012  . Reflux 01/19/2014  . Thyroid disease     Patient Active Problem List   Diagnosis Date Noted  . Achilles tendinitis, left leg 05/27/2016  . Achilles tendinitis, right leg 05/27/2016  . Diarrhea 07/15/2015  . Abdominal pain, chronic, right lower quadrant 08/15/2014  . Abdominal pain   . Nexplanon insertion 02/07/2014  . Nexplanon removal 02/07/2014  . Reflux 01/19/2014  . Heme positive stool 08/04/2013  . Pain in right hip 08/09/2012  . Hx of chlamydia infection 08/05/2012  . 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 Obesity 08/05/2012    Past Surgical History:  Procedure Laterality Date  . CESAREAN SECTION    . COLONOSCOPY N/A 09/01/2013   Procedure: COLONOSCOPY;  Surgeon: Rogene Houston, MD;  [facility];  Service: Endoscopy;  Laterality: N/A;  100  . COLONOSCOPY WITH PROPOFOL N/A 07/29/2019   Procedure: COLONOSCOPY WITH PROPOFOL;  Surgeon: Rogene Houston, MD;  Location: [facility];  Service: Endoscopy;  Laterality: N/A;  200  . ESOPHAGOGASTRODUODENOSCOPY (EGD) WITH PROPOFOL N/A 07/29/2019   Procedure: ESOPHAGOGASTRODUODENOSCOPY (EGD) WITH PROPOFOL;  Surgeon: Rogene Houston, MD;  Location: [facility];  Service: Endoscopy;  Laterality: N/A;     OB History    Gravida  1   Para  1   Term  1   Preterm      AB      Living  1     SAB      TAB      Ectopic      Multiple      Live Births  1           Family History  Problem Relation Age of Onset  . Hypertension Mother   . Diabetes Mother   . COPD Mother   . Hypertension Sister   . Cancer Maternal Grandfather   . Cancer Father     Social History   Tobacco Use  . Smoking status: Current Some Day Smoker    Packs/day: 0.50    Types: Cigarettes  . Smokeless tobacco: Never Used  . Tobacco comment: on the weekends when she  drinks  Substance Use Topics  . Alcohol use: Not Currently    Comment: on weekends  . Drug use: No    Home Medications Prior to Admission medications   Medication Sig Start Date End Date Taking? Authorizing Provider  albuterol (PROVENTIL HFA;VENTOLIN HFA) 108 (90 Base) MCG/ACT inhaler Inhale 2 puffs into the lungs every 6 (six) hours as needed for wheezing or shortness of breath. 07/08/15   Law, Bea Graff, PA-C  escitalopram (LEXAPRO) 20 MG tablet Take 30 mg by mouth daily.    [provider]  etonogestrel (IMPLANON) 68 MG IMPL implant Inject 1 each into the skin once.    [provider]  HYDROcodone-acetaminophen (NORCO/VICODIN) 5-325 MG tablet Take 1 tablet by mouth every 4 (four) hours as needed. 09/05/19   Fransico Meadow, PA-C  levothyroxine (SYNTHROID) 50 MCG tablet Take 50 mcg by mouth daily before breakfast.    [provider]  meclizine (ANTIVERT) 25 MG tablet Take 25 mg by mouth 3 (three) times daily as needed for dizziness.    [provider]  MELATONIN PO Take 1 tablet by mouth at bedtime as needed (sleep).    [provider]  meloxicam (MOBIC) 15 MG tablet Take 1 tablet (15 mg total) by mouth daily as needed for pain. 07/29/19   Rehman, Mechele Dawley, MD  pantoprazole (PROTONIX) 40 MG tablet Take 40 mg by mouth daily.     [provider]  polyethylene glycol powder (MIRALAX) 17 GM/SCOOP powder Take 17 g by mouth daily. 07/29/19   Rogene Houston, MD  predniSONE (DELTASONE) 10 MG tablet 6,5,4,3,2,1 taper 09/05/19   Caryl Ada K, PA-C  psyllium (METAMUCIL SMOOTH TEXTURE) 58.6 % powder Take 1 packet by mouth at bedtime. 07/29/19   Rehman, Mechele Dawley, MD  tiZANidine (ZANAFLEX) 2 MG tablet Take by mouth every 8 (eight) hours as needed for muscle spasms.    [provider]    Allergies    Keflex [cephalexin]  Review of Systems   Review of Systems  Musculoskeletal: Positive for back pain.  Neurological: Negative for numbness.  All other systems reviewed and are negative.   Physical Exam Updated Vital Signs BP 123/89 (BP Location: Right Wrist)   Pulse 85   Temp 98.2 F (36.8 C) (Oral)   Resp 18   Ht 5\' 6"  (1.676 m)   Wt (!) 168.3 kg   SpO2 99%   BMI 59.88 kg/m   Physical Exam Vitals and nursing note reviewed.  Constitutional:      Appearance: She is well-developed.  HENT:     Head: Normocephalic.  Cardiovascular:     Rate and Rhythm: Normal rate.  Pulmonary:     Effort: Pulmonary effort is normal.  Abdominal:     General: There is no distension.  Musculoskeletal:        General: Normal range of motion.     Cervical back: Normal range of motion.  Skin:    General: Skin is warm.  Neurological:     General: No focal deficit present.     Mental Status: She is alert and oriented to person, place, and time.  Psychiatric:        Mood and Affect: Mood  normal.     ED Results / Procedures / Treatments   Labs (all labs ordered are listed, but only abnormal results are displayed) Labs Reviewed - No data to display  EKG None  Radiology DG Chest [facility] 1 View  Result Date: 09/05/2019  CLINICAL DATA:  Back pain for 1 week. No known injury. EXAM: PORTABLE CHEST 1 VIEW COMPARISON:  08/24/2019 FINDINGS: There is a persistent rounded opacity in the right upper lobe more prominent than on prior examinations. There is no focal consolidation. There is no pleural effusion or pneumothorax. The heart and mediastinal contours are unremarkable. There is no acute osseous abnormality. IMPRESSION: 1. No acute cardiopulmonary disease. 2. Persistent rounded opacity in the right upper lobe more prominent than on prior examinations. Recommend further evaluation with a nonemergent CT of the chest. Electronically Signed   By: Kathreen Devoid   On: 09/05/2019 12:38    Procedures Procedures (including critical care time)  Medications Ordered in ED Medications  oxyCODONE-acetaminophen (PERCOCET/ROXICET) 5-325 MG per tablet 2 tablet (2 tablets Oral Given 09/05/19 1223)    ED Course  I have reviewed the triage vital signs and the nursing notes.  Pertinent labs & imaging results that were available during my care of the patient were reviewed by me and considered in my medical decision making (see chart for details).    MDM Rules/Calculators/A&P                      MDM: chest xray shows possible nodule, radiology has advised 1 month follow up.  Pt given hydrocodone here.  Pt given rx for prednisone.  I advised follow up with primary care and spine surgeon  Final Clinical Impression(s) / ED Diagnoses Final diagnoses:  Chronic right-sided thoracic back pain    Rx / DC Orders ED Discharge Orders         Ordered    predniSONE (DELTASONE) 10 MG tablet     09/05/19 1327    HYDROcodone-acetaminophen (NORCO/VICODIN) 5-325 MG tablet  Every 4 hours PRN,   Status:   Discontinued     09/05/19 1327    HYDROcodone-acetaminophen (NORCO/VICODIN) 5-325 MG tablet  Every 4 hours PRN     09/05/19 1328        An After Visit Summary was printed and given to the patient.    Fransico Meadow, [place] 09/05/19 1557    Margette Fast, MD 09/05/19 [phone]

## 2019-09-30 ENCOUNTER — Emergency Department (HOSPITAL_COMMUNITY)
Admission: EM | Admit: 2019-09-30 | Discharge: 2019-10-01 | Disposition: A | Discharge status: Home / Self Care | Payer: Medicaid Other | Attending: Emergency Medicine | Admitting: Emergency Medicine

## 2019-09-30 ENCOUNTER — Encounter (HOSPITAL_COMMUNITY): Payer: Self-pay

## 2019-09-30 ENCOUNTER — Other Ambulatory Visit: Payer: Self-pay

## 2019-09-30 DIAGNOSIS — Z79899 Other long term (current) drug therapy: Secondary | ICD-10-CM | POA: Diagnosis not present

## 2019-09-30 DIAGNOSIS — K625 Hemorrhage of anus and rectum: Secondary | ICD-10-CM

## 2019-09-30 DIAGNOSIS — R42 Dizziness and giddiness: Secondary | ICD-10-CM | POA: Insufficient documentation

## 2019-09-30 DIAGNOSIS — R10819 Abdominal tenderness, unspecified site: Secondary | ICD-10-CM | POA: Diagnosis present

## 2019-09-30 DIAGNOSIS — F17210 Nicotine dependence, cigarettes, uncomplicated: Secondary | ICD-10-CM | POA: Diagnosis not present

## 2019-09-30 LAB — COMPREHENSIVE METABOLIC PANEL
ALT: 27 U/L (ref 0–44)
AST: 31 U/L (ref 15–41)
Albumin: 3.8 g/dL (ref 3.5–5.0)
Alkaline Phosphatase: 63 U/L (ref 38–126)
Anion gap: 11 (ref 5–15)
BUN: 7 mg/dL (ref 6–20)
CO2: 26 mmol/L (ref 22–32)
Calcium: 9.1 mg/dL (ref 8.9–10.3)
Chloride: 100 mmol/L (ref 98–111)
Creatinine, Ser: 0.81 mg/dL (ref 0.44–1.00)
GFR calc Af Amer: >60 mL/min (ref >60)
GFR calc non Af Amer: >60 mL/min (ref >60)
Glucose, Bld: 92 mg/dL (ref 70–99)
Potassium: 3.9 mmol/L (ref 3.5–5.1)
Sodium: 137 mmol/L (ref 135–145)
Total Bilirubin: 0.4 mg/dL (ref 0.3–1.2)
Total Protein: 7.6 g/dL (ref 6.5–8.1)

## 2019-09-30 LAB — CBC
HCT: 45.2 % (ref 36.0–46.0)
Hemoglobin: 14.5 g/dL (ref 12.0–15.0)
MCH: 32.8 pg (ref 26.0–34.0)
MCHC: 32.1 g/dL (ref 30.0–36.0)
MCV: 102.3 fL — ABNORMAL HIGH (ref 80.0–100.0)
Platelets: 338 10*3/uL (ref 150–400)
RBC: 4.42 MIL/uL (ref 3.87–5.11)
RDW: 14.7 % (ref 11.5–15.5)
WBC: 10.5 10*3/uL (ref 4.0–10.5)
nRBC: 0 % (ref 0.0–0.2)

## 2019-09-30 LAB — POC OCCULT BLOOD, ED: Fecal Occult Bld: POSITIVE — AB

## 2019-09-30 MED ORDER — MECLIZINE HCL 12.5 MG PO TABS
25.0000 mg | ORAL_TABLET | Freq: Once | ORAL | Status: AC
  Administered 2019-10-01: 25 mg via ORAL
  Filled 2019-09-30: qty 2

## 2019-09-30 MED ORDER — SODIUM CHLORIDE 0.9 % IV BOLUS
1000.0000 mL | Freq: Once | INTRAVENOUS | Status: AC
  Administered 2019-10-01: 1000 mL via INTRAVENOUS

## 2019-09-30 NOTE — ED Notes (Signed)
Dr Wyvonnia Dusky notified of positive fecal occult.

## 2019-09-30 NOTE — ED Provider Notes (Signed)
[facility] EMERGENCY DEPARTMENT Provider Note   CSN: [id number] Arrival date & time: 09/30/19  1917     History Chief Complaint  Patient presents with  . Rectal Bleeding    Amy [name] is a 44 y.o. female.  Patient with history of obesity, acid reflux disease, diverticulosis presenting with episode of dizziness and rectal bleeding.  States she had intermittent rectal bleeding throughout the day today with several episodes of bright red blood in the toilet bowl with clots without stool.  States she normally has brown stool and occasionally has bleeding on and off.  She underwent colonoscopy and EGD by Dr. Laural Golden several months ago and did not show anything acute.  She does not take any blood thinners.  She states since the bleeding has started she has been feeling dizzy and lightheaded as well as room spinning.  She is had issues with vertigo and dizziness in the past for over a year and takes meclizine on an as-needed basis.  States she had a near syncopal episode while she was on the toilet and felt dizzy with lightheaded and nausea but did not fall or hit her head.  Her dizziness is since improved.  She is ongoing right-sided abdominal pain which is unchanged.  No chest pain.  Also complaining of a gradual onset diffuse headache with lightheadedness and dizziness and nausea.  No focal weakness, numbness or tingling.  No vaginal bleeding or discharge.   Rectal Bleeding Associated symptoms: abdominal pain, dizziness and light-headedness   Associated symptoms: no fever and no vomiting        Past Medical History:  Diagnosis Date  . Acid reflux   . Diverticulosis   . GERD (gastroesophageal reflux disease)   . HSV-2 (herpes simplex virus 2) infection   . Hx of chlamydia infection   . Nexplanon insertion 02/07/2014   Inserted left arm 02/07/14  . Nexplanon removal 02/07/2014  . Obesity   . Pain in right hip 08/09/2012  . Reflux 01/19/2014  . Thyroid disease     Patient  Active Problem List   Diagnosis Date Noted  . Achilles tendinitis, left leg 05/27/2016  . Achilles tendinitis, right leg 05/27/2016  . Diarrhea 07/15/2015  . Abdominal pain, chronic, right lower quadrant 08/15/2014  . Abdominal pain   . Nexplanon insertion 02/07/2014  . Nexplanon removal 02/07/2014  . Reflux 01/19/2014  . Heme positive stool 08/04/2013  . Pain in right hip 08/09/2012  . Hx of chlamydia infection 08/05/2012  . 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 Obesity 08/05/2012    Past Surgical History:  Procedure Laterality Date  . CESAREAN SECTION    . COLONOSCOPY N/A 09/01/2013   Procedure: COLONOSCOPY;  Surgeon: Rogene Houston, MD;  [facility];  Service: Endoscopy;  Laterality: N/A;  100  . COLONOSCOPY WITH PROPOFOL N/A 07/29/2019   Procedure: COLONOSCOPY WITH PROPOFOL;  Surgeon: Rogene Houston, MD;  [facility];  Service: Endoscopy;  Laterality: N/A;  200  . ESOPHAGOGASTRODUODENOSCOPY (EGD) WITH PROPOFOL N/A 07/29/2019   Procedure: ESOPHAGOGASTRODUODENOSCOPY (EGD) WITH PROPOFOL;  Surgeon: Rogene Houston, MD;  Location: [facility];  Service: Endoscopy;  Laterality: N/A;     OB History    Gravida  1   Para  1   Term  1   Preterm      AB      Living  1     SAB      TAB  Ectopic      Multiple      Live Births  1           Family History  Problem Relation Age of Onset  . Hypertension Mother   . Diabetes Mother   . COPD Mother   . Hypertension Sister   . Cancer Maternal Grandfather   . Cancer Father     Social History   Tobacco Use  . Smoking status: Current Some Day Smoker    Packs/day: 0.50    Types: Cigarettes  . Smokeless tobacco: Never Used  . Tobacco comment: on the weekends when she drinks  Substance Use Topics  . Alcohol use: Yes    Comment: on weekends  . Drug use: No    Home Medications Prior to Admission medications   Medication Sig Start Date End Date Taking? Authorizing  Provider  albuterol (PROVENTIL HFA;VENTOLIN HFA) 108 (90 Base) MCG/ACT inhaler Inhale 2 puffs into the lungs every 6 (six) hours as needed for wheezing or shortness of breath. 07/08/15  Yes Law, Alexandra M, PA-C  escitalopram (LEXAPRO) 20 MG tablet Take 30 mg by mouth daily.   Yes [provider]  HYDROcodone-acetaminophen (NORCO/VICODIN) 5-325 MG tablet Take 1 tablet by mouth every 4 (four) hours as needed. 09/05/19  Yes Caryl Ada K, PA-C  hydrocortisone cream (PREPARATION H) 1 % Apply 1 application topically daily as needed for itching.   Yes [provider]  levothyroxine (SYNTHROID) 50 MCG tablet Take 50 mcg by mouth daily before breakfast.   Yes [provider]  meclizine (ANTIVERT) 25 MG tablet Take 25 mg by mouth 3 (three) times daily as needed for dizziness.   Yes [provider]  Multiple Vitamin (MULTIVITAMIN WITH MINERALS) TABS tablet Take 1 tablet by mouth daily.   Yes [provider]  pantoprazole (PROTONIX) 40 MG tablet Take 40 mg by mouth daily.    Yes [provider]  polyethylene glycol powder (MIRALAX) 17 GM/SCOOP powder Take 17 g by mouth daily. Patient taking differently: Take 17 g by mouth daily as needed for mild constipation or moderate constipation.  07/29/19  Yes Rehman, Mechele Dawley, MD  pregabalin (LYRICA) 75 MG capsule Take 150 mg by mouth daily as needed (for pain).   Yes [provider]  tiZANidine (ZANAFLEX) 2 MG tablet Take 2 mg by mouth every 8 (eight) hours as needed for muscle spasms.    Yes [provider]  predniSONE (DELTASONE) 10 MG tablet 6,5,4,3,2,1 taper Patient not taking: Reported on 09/30/2019 09/05/19   Fransico Meadow, PA-C    Allergies    Keflex [cephalexin]  Review of Systems   Review of Systems  Constitutional: Positive for activity change, appetite change and fatigue. Negative for fever.  Eyes: Negative for visual disturbance.  Respiratory: Negative for cough, chest tightness  and shortness of breath.   Cardiovascular: Negative for chest pain.  Gastrointestinal: Positive for abdominal pain, blood in stool and hematochezia. Negative for nausea and vomiting.  Genitourinary: Negative for dysuria, frequency and hematuria.  Musculoskeletal: Negative for arthralgias, myalgias and neck stiffness.  Skin: Negative for rash.  Neurological: Positive for dizziness, weakness and light-headedness.    all other systems are negative except as noted in the HPI and PMH.   Physical Exam Updated Vital Signs BP (!) 135/98 (BP Location: Left Arm)   Pulse 91   Temp 98 F (36.7 C) (Oral)   Resp (!) 22   Ht 5\' 6"  (1.676 m)   Wt Marland Kitchen)  164.2 kg   SpO2 99%   BMI 58.43 kg/m   Physical Exam Vitals and nursing note reviewed.  Constitutional:      General: She is not in acute distress.    Appearance: She is well-developed. She is obese.  HENT:     Head: Normocephalic and atraumatic.     Mouth/Throat:     Pharynx: No oropharyngeal exudate.  Eyes:     Conjunctiva/sclera: Conjunctivae normal.     Pupils: Pupils are equal, round, and reactive to light.  Neck:     Comments: No meningismus. Cardiovascular:     Rate and Rhythm: Normal rate and regular rhythm.     Heart sounds: Normal heart sounds. No murmur heard.   Pulmonary:     Effort: Pulmonary effort is normal. No respiratory distress.     Breath sounds: Normal breath sounds.  Abdominal:     Palpations: Abdomen is soft.     Tenderness: There is abdominal tenderness. There is no guarding or rebound.     Comments: Right-sided abdominal tenderness, no guarding or rebound  Genitourinary:    Comments: Chaperone present.  Small external hemorrhoids appear to be nonbleeding.  Positive gross blood on finger. Musculoskeletal:        General: No tenderness. Normal range of motion.     Cervical back: Normal range of motion and neck supple.  Skin:    General: Skin is warm.  Neurological:     Mental Status: She is alert and  oriented to person, place, and time.     Cranial Nerves: No cranial nerve deficit.     Motor: No abnormal muscle tone.     Coordination: Coordination normal.     Comments: CN 2-12 intact, no ataxia on finger to nose, no nystagmus, 5/5 strength throughout, no pronator drift, Romberg negative,   No nystagmus.  No ataxia on finger-to-nose.  Negative Romberg.   Psychiatric:        Behavior: Behavior normal.     ED Results / Procedures / Treatments   Labs (all labs ordered are listed, but only abnormal results are displayed) Labs Reviewed  CBC - Abnormal; Notable for the following components:      Result Value   MCV 102.3 (*)    All other components within normal limits  URINALYSIS, ROUTINE W REFLEX MICROSCOPIC - Abnormal; Notable for the following components:   APPearance HAZY (*)    Nitrite POSITIVE (*)    All other components within normal limits  POC OCCULT BLOOD, ED - Abnormal; Notable for the following components:   Fecal Occult Bld POSITIVE (*)    All other components within normal limits  COMPREHENSIVE METABOLIC PANEL  PREGNANCY, URINE  HEMOGLOBIN AND HEMATOCRIT, BLOOD  POC OCCULT BLOOD, ED  TROPONIN I (HIGH SENSITIVITY)  TROPONIN I (HIGH SENSITIVITY)    EKG EKG Interpretation  Date/Time:  Friday September 30 2019 19:34:30 EDT Ventricular Rate:  87 PR Interval:    QRS Duration: 78 QT Interval:  366 QTC Calculation: 441 R Axis:   57 Text Interpretation: Sinus rhythm Low voltage, precordial leads No significant change was found Confirmed by Ezequiel Essex [phone]) on 09/30/2019 11:59:30 PM   Radiology CT Head Wo Contrast  Result Date: 10/01/2019 CLINICAL DATA:  Vertigo EXAM: CT HEAD WITHOUT CONTRAST TECHNIQUE: Contiguous axial images were obtained from the base of the skull through the vertex without intravenous contrast. COMPARISON:  10/10/2005 FINDINGS: Brain: No acute intracranial abnormality. Specifically, no hemorrhage, hydrocephalus, mass lesion, acute  infarction, or significant  intracranial injury. Vascular: No hyperdense vessel or unexpected calcification. Skull: No acute calvarial abnormality. Sinuses/Orbits: Visualized paranasal sinuses and mastoids clear. Orbital soft tissues unremarkable. Other: None IMPRESSION: No acute intracranial abnormality. Electronically Signed   By: Rolm Baptise M.D.   On: 10/01/2019 01:59   CT ABDOMEN PELVIS W CONTRAST  Result Date: 10/01/2019 CLINICAL DATA:  Lower GI bleed. EXAM: CT ABDOMEN AND PELVIS WITH CONTRAST TECHNIQUE: Multidetector CT imaging of the abdomen and pelvis was performed using the standard protocol following bolus administration of intravenous contrast. CONTRAST:  143mL OMNIPAQUE IOHEXOL 300 MG/ML  SOLN COMPARISON:  CT and pelvis dated Aug 21, 2017. FINDINGS: Lower chest: The lung bases are clear. The heart size is normal. Hepatobiliary: There is decreased hepatic attenuation suggestive of hepatic steatosis. Normal gallbladder.There is no biliary ductal dilation. Pancreas: Normal contours without ductal dilatation. No peripancreatic fluid collection. Spleen: Unremarkable. Adrenals/Urinary Tract: --Adrenal glands: Unremarkable. --Right kidney/ureter: No hydronephrosis or radiopaque kidney stones. --Left kidney/ureter: No hydronephrosis or radiopaque kidney stones. --Urinary bladder: Unremarkable. Stomach/Bowel: --Stomach/Duodenum: No hiatal hernia or other gastric abnormality. Normal duodenal course and caliber. --Small bowel: Unremarkable. --Colon: There are scattered colonic diverticula without CT evidence for diverticulitis. --Appendix: Normal. Vascular/Lymphatic: Atherosclerotic calcification is present within the non-aneurysmal abdominal aorta, without hemodynamically significant stenosis. --No retroperitoneal lymphadenopathy. --No mesenteric lymphadenopathy. --No pelvic or inguinal lymphadenopathy. Reproductive: Unremarkable Other: No ascites or free air. Body wall edema is again noted. Musculoskeletal.  No acute displaced fractures. IMPRESSION: 1. No acute abdominopelvic abnormality. 2. Hepatic steatosis. 3. Scattered colonic diverticula without CT evidence for diverticulitis. Aortic Atherosclerosis (ICD10-I70.0). Electronically Signed   By: Constance Holster M.D.   On: 10/01/2019 01:10    Procedures Procedures (including critical care time)  Medications Ordered in ED Medications  sodium chloride 0.9 % bolus 1,000 mL (has no administration in time range)  meclizine (ANTIVERT) tablet 25 mg (has no administration in time range)    ED Course  I have reviewed the triage vital signs and the nursing notes.  Pertinent labs & imaging results that were available during my care of the patient were reviewed by me and considered in my medical decision making (see chart for details).    MDM Rules/Calculators/A&P                         Intermittent rectal bleeding today as well as dizziness and lightheadedness and near syncopal episode while she was on the toilet.  Vitals are stable.  He is in no distress.  Neurological exam is nonfocal.  Occult is positive but hemoglobin is stable.  Patient had EGD and colonoscopy in April 2021 that showed diverticulosis and hemorrhoids. EGD was normal.  EKG today shows normal sinus rhythm, no Brugada, no prolonged QT.  Orthostatics are positive by heart rate.  Patient HR goes 85-100.  CT scan shows diverticulosis without evidence of diverticulitis.  Recent colonoscopy reassuring as above.  1 g drop in hemoglobin after hydration.  Patient able to ambulate and tolerate p.o. without dizziness or difficulty.  Head CT is negative.  Nonfocal neurological exam.  She is tolerating p.o. and ambulatory.  Her headache has resolved.  Suspect her bleeding is likely due to diverticulosis and hemorrhoids.  Recent reassuring colonoscopy as above.  CT imaging tonight otherwise benign. Troponin negative x2.  She is able to ambulate tolerate p.o.  Advised PCP follow-up as well  as follow-up with her gastroenterologist.  Return precautions discussed.  Final Clinical Impression(s) / ED Diagnoses Final  diagnoses:  Rectal bleeding    Rx / DC Orders ED Discharge Orders    None       Lyza Houseworth, Annie Main, MD 10/01/19 [phone]

## 2019-09-30 NOTE — ED Triage Notes (Signed)
Pt to er hallway two, per ems pt went to have a bm and passed a bunch of blood and clots.  Pt states that she got up this am and she had some dampness in her rectum, states that it was bright red bleeding.  States that she had some pizza and watermelon.  Denies dizziness.  Pt states that tonight she went to use the bathroom and felt like she was dizzy and sat for a minute.  States that she felt like she might pass out, states that there was some blood and clots on the tp.

## 2019-09-30 NOTE — ED Notes (Signed)
Pt in bed, pt reports dizziness when going from laying to sitting, pt reports increased dizziness when standing, pt unable to complete orthostatic vital signs.

## 2019-10-01 ENCOUNTER — Emergency Department (HOSPITAL_COMMUNITY): Payer: Medicaid Other

## 2019-10-01 LAB — URINALYSIS, ROUTINE W REFLEX MICROSCOPIC
Bacteria, UA: NONE SEEN
Bilirubin Urine: NEGATIVE
Glucose, UA: NEGATIVE mg/dL
Hgb urine dipstick: NEGATIVE
Ketones, ur: NEGATIVE mg/dL
Leukocytes,Ua: NEGATIVE
Nitrite: POSITIVE — AB
Protein, ur: NEGATIVE mg/dL
Specific Gravity, Urine: 1.014 (ref 1.005–1.030)
pH: 6 (ref 5.0–8.0)

## 2019-10-01 LAB — HEMOGLOBIN AND HEMATOCRIT, BLOOD
HCT: 40.8 % (ref 36.0–46.0)
Hemoglobin: 13.4 g/dL (ref 12.0–15.0)

## 2019-10-01 LAB — TROPONIN I (HIGH SENSITIVITY)
Troponin I (High Sensitivity): 4 ng/L (ref <18)
Troponin I (High Sensitivity): 4 ng/L (ref <18)

## 2019-10-01 LAB — PREGNANCY, URINE: Preg Test, Ur: NEGATIVE

## 2019-10-01 MED ORDER — DIPHENHYDRAMINE HCL 50 MG/ML IJ SOLN
25.0000 mg | Freq: Once | INTRAMUSCULAR | Status: AC
  Administered 2019-10-01: 25 mg via INTRAVENOUS
  Filled 2019-10-01: qty 1

## 2019-10-01 MED ORDER — METOCLOPRAMIDE HCL 5 MG/ML IJ SOLN
10.0000 mg | Freq: Once | INTRAMUSCULAR | Status: AC
  Administered 2019-10-01: 10 mg via INTRAVENOUS
  Filled 2019-10-01: qty 2

## 2019-10-01 MED ORDER — IOHEXOL 300 MG/ML  SOLN
100.0000 mL | Freq: Once | INTRAMUSCULAR | Status: AC | PRN
  Administered 2019-10-01: 100 mL via INTRAVENOUS

## 2019-10-01 NOTE — ED Notes (Signed)
Patient states that her head hurts really bed. States that she keeps her head down it helps with the nausea. Patient aware that a urine specimen is still needed.

## 2019-10-01 NOTE — Discharge Instructions (Signed)
Your blood counts today are stable.  We suspect your rectal bleeding is likely from hemorrhoids or diverticulosis.  Keep yourself hydrated and follow-up with your doctor as well as Dr. Laural Golden next week.  Return to the ED with worsening pain, rectal bleeding, dizziness, lightheadedness, chest pain, shortness of breath, or other concerns.

## 2019-10-01 NOTE — ED Notes (Signed)
Pt drank a cup of water with pills earlier and tolerated well.

## 2019-10-01 NOTE — ED Notes (Signed)
Pt ambulated from bed to wheelchair and then from wheelchair to bathroom without any c/o dizziness.

## 2019-10-01 NOTE — ED Notes (Signed)
Assisted patient to restroom via wheelchair.  Reminded patient that a urine specimen is needed. Patient was able to give specimen, sent to lab at this time.

## 2019-12-29 ENCOUNTER — Ambulatory Visit (INDEPENDENT_AMBULATORY_CARE_PROVIDER_SITE_OTHER): Payer: Medicaid Other | Admitting: Gastroenterology

## 2020-01-11 ENCOUNTER — Ambulatory Visit: Payer: Self-pay

## 2020-01-11 ENCOUNTER — Ambulatory Visit (INDEPENDENT_AMBULATORY_CARE_PROVIDER_SITE_OTHER): Payer: Medicaid Other | Admitting: Physician Assistant

## 2020-01-11 ENCOUNTER — Encounter: Payer: Self-pay | Admitting: Physician Assistant

## 2020-01-11 VITALS — Ht 66.0 in | Wt 346.0 lb

## 2020-01-11 DIAGNOSIS — M79671 Pain in right foot: Secondary | ICD-10-CM | POA: Diagnosis not present

## 2020-01-11 DIAGNOSIS — M79672 Pain in left foot: Secondary | ICD-10-CM | POA: Diagnosis not present

## 2020-01-11 NOTE — Progress Notes (Signed)
Office Visit Note   Patient: Amy [name]           Date of Birth: [birth year]           MRN: [id number] Visit Date: 01/11/2020              Requested by: Medicine, [place] [facility] No address on file PCP: [facility], [place]  Chief Complaint  Patient presents with  . Right Foot - Pain  . Left Foot - Pain      HPI: This is a pleasant 44 year old woman with a history of bilateral heel pain.  She was seen by Dr. Sharol Given earlier this year.  She was diagnosed with plantar fasciitis and insertional Achilles tendinitis.  While she was doing the exercises she felt she did have improvement.  She now has a return of pain.  Pain has not changed still on her heels  Assessment & Plan: Visit Diagnoses:  1. Bilateral foot pain     Plan: Discussed with her that we will go forward with a month of physical therapy.  Also the importance of well supportive shoes with minimum flex.  Follow-Up Instructions: No follow-ups on file.   Ortho Exam  Patient is alert, oriented, no adenopathy, well-dressed, normal affect, normal respiratory effort. Bilateral feet.  No swelling no cellulitis no skin breakdown.  She is focally tender at the insertion of the plantar fascia.  Also tender over the insertion of the Achilles into the posterior calcaneus pulses are intact compartments are soft and nontender  Imaging: No results found. No images are attached to the encounter.  Labs: Lab Results  Component Value Date   REPTSTATUS 06/30/2018 FINAL 06/26/2018   CULT (A) 06/26/2018    >=100,000 COLONIES/mL ESCHERICHIA COLI Confirmed Extended Spectrum Beta-Lactamase Producer (ESBL).  In bloodstream infections from ESBL organisms, carbapenems are preferred over piperacillin/tazobactam. They are shown to have a lower risk of mortality.    LABORGA ESCHERICHIA COLI (A) 06/26/2018     Lab Results  Component Value Date   ALBUMIN 3.8 09/30/2019   ALBUMIN 3.5  04/30/2019   ALBUMIN 3.3 (L) 06/26/2018    No results found for: MG No results found for: VD25OH  No results found for: PREALBUMIN CBC EXTENDED Latest Ref Rng & Units 10/01/2019 09/30/2019 07/26/2019  WBC 4.0 - 10.5 K/uL - 10.5 10.2  RBC 3.87 - 5.11 MIL/uL - 4.42 4.28  HGB 12.0 - 15.0 g/dL 13.4 14.5 13.7  HCT 36 - 46 % 40.8 45.2 43.0  PLT 150 - 400 K/uL - 338 338  NEUTROABS 1.7 - 7.7 K/uL - - 7.6  LYMPHSABS 0.7 - 4.0 K/uL - - 1.9     Body mass index is 55.85 kg/m.  Orders:  Orders Placed This Encounter  Procedures  . XR Foot 2 Views Left  . XR Foot 2 Views Right  . Ambulatory referral to Physical Therapy   No orders of the defined types were placed in this encounter.    Procedures: No procedures performed  Clinical Data: No additional findings.  ROS:  All other systems negative, except as noted in the HPI. Review of Systems  Objective: Vital Signs: Ht 5\' 6"  (1.676 m)   Wt (!) 346 lb (156.9 kg)   BMI 55.85 kg/m   Specialty Comments:  No specialty comments available.  PMFS History: Patient Active Problem List   Diagnosis Date Noted  . Achilles tendinitis, left leg 05/27/2016  . Achilles tendinitis, right leg 05/27/2016  .  Diarrhea 07/15/2015  . Abdominal pain, chronic, right lower quadrant 08/15/2014  . Abdominal pain   . Nexplanon insertion 02/07/2014  . Nexplanon removal 02/07/2014  . Reflux 01/19/2014  . Heme positive stool 08/04/2013  . Pain in right hip 08/09/2012  . Hx of chlamydia infection 08/05/2012  . 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 Obesity 08/05/2012   Past Medical History:  Diagnosis Date  . Acid reflux   . Diverticulosis   . GERD (gastroesophageal reflux disease)   . HSV-2 (herpes simplex virus 2) infection   . Hx of chlamydia infection   . Nexplanon insertion 02/07/2014   Inserted left arm 02/07/14  . Nexplanon removal 02/07/2014  . Obesity   . Pain in right hip 08/09/2012  . Reflux 01/19/2014  . Thyroid  disease     Family History  Problem Relation Age of Onset  . Hypertension Mother   . Diabetes Mother   . COPD Mother   . Hypertension Sister   . Cancer Maternal Grandfather   . Cancer Father     Past Surgical History:  Procedure Laterality Date  . CESAREAN SECTION    . COLONOSCOPY N/A 09/01/2013   Procedure: COLONOSCOPY;  Surgeon: Rogene Houston, MD;  [facility];  Service: Endoscopy;  Laterality: N/A;  100  . COLONOSCOPY WITH PROPOFOL N/A 07/29/2019   Procedure: COLONOSCOPY WITH PROPOFOL;  Surgeon: Rogene Houston, MD;  [facility];  Service: Endoscopy;  Laterality: N/A;  200  . ESOPHAGOGASTRODUODENOSCOPY (EGD) WITH PROPOFOL N/A 07/29/2019   Procedure: ESOPHAGOGASTRODUODENOSCOPY (EGD) WITH PROPOFOL;  Surgeon: Rogene Houston, MD;  [facility];  Service: Endoscopy;  Laterality: N/A;   Social History   Occupational History  . Not on file  Tobacco Use  . Smoking status: Current Some Day Smoker    Packs/day: 0.50    Types: Cigarettes  . Smokeless tobacco: Never Used  . Tobacco comment: on the weekends when she drinks  Substance and Sexual Activity  . Alcohol use: Yes    Comment: on weekends  . Drug use: No  . Sexual activity: Yes    Birth control/protection: Implant

## 2020-01-19 ENCOUNTER — Encounter (INDEPENDENT_AMBULATORY_CARE_PROVIDER_SITE_OTHER): Payer: Self-pay | Admitting: Gastroenterology

## 2020-01-19 ENCOUNTER — Ambulatory Visit (INDEPENDENT_AMBULATORY_CARE_PROVIDER_SITE_OTHER): Payer: Medicaid Other | Admitting: Gastroenterology

## 2020-01-19 ENCOUNTER — Other Ambulatory Visit: Payer: Self-pay

## 2020-01-19 VITALS — BP 127/93 | HR 93 | Temp 98.8°F | Ht 64.0 in | Wt 353.1 lb

## 2020-01-19 DIAGNOSIS — K6289 Other specified diseases of anus and rectum: Secondary | ICD-10-CM

## 2020-01-19 DIAGNOSIS — K582 Mixed irritable bowel syndrome: Secondary | ICD-10-CM | POA: Diagnosis not present

## 2020-01-19 DIAGNOSIS — K589 Irritable bowel syndrome without diarrhea: Secondary | ICD-10-CM | POA: Insufficient documentation

## 2020-01-19 MED ORDER — HYOSCYAMINE SULFATE 0.125 MG SL SUBL: 0.1250 mg | SUBLINGUAL_TABLET | Freq: Three times a day (TID) | SUBLINGUAL | 0 refills | Status: DC | PRN

## 2020-01-19 NOTE — Patient Instructions (Signed)
Start Benefiber fiber supplements daily to increase stool bulk Stop Miralax and Metamucil for now Explained presumed etiology of IBS symptoms. Patient was counseled about the benefit of implementing a low FODMAP to improve symptoms and recurrent episodes. A dietary list was provided to the patient. Also, the patient was counseled about the benefit of avoiding stressing situations and potential environmental triggers leading to symptomatology. Start Levsin 1 tab every 8 hours as needed for abdominal cramping Perform blood workup

## 2020-01-19 NOTE — Progress Notes (Signed)
Amy [name], M.D. Gastroenterology & Hepatology [facility] [address] [place], [place] [address]  Primary Care Physician: Medicine, [place] [facility] No address on file  I will communicate my assessment and recommendations to the referring MD via EMR. "Note: Occasional unusual wording and randomly placed punctuation marks may result from the use of speech recognition technology to transcribe this document"  Problems: 1. IBS-M 2. Rectal discomfort 3. GERD  History of Present Illness: Amy [name] is a 44 y.o. female with PMH IBS-M, GERD, diverticulosis, hypothyroidism, who presents for follow up of rectal discomfort.  Patient reports that she has a longstanding history of right sided abdominal pain, which she describes as intermittent episodes of pressure like pain that does not radiate anywhere else and that is present especially when she gets constipated and distended.  She also endorses that she has had episodes of intermittent diarrhea and constipation for the last 5 years. She states she predominantly has diarrhea most of the month, occasionally has had constipation once a month that last for 3 days and resolves after she takes Metamucil and MiraLAX.  Importantly, she reports that after she has "the last of her bowel movements" she feels significant relief of her abdominal pain. Sometimes she has some blood in her stool, which is fresh blood mostly when she has multiple bowel movements. However, she stopped taking these medications when the diarrhea recurs.  She states that in the past she was tried on Linzess but she could not tolerate due to severe diarrhea.  Importantly, the patient has also recently felt she has burning sensation in the rectal area whe she has a BM and episodes of flatulence that causes burning sensation in that same area.  She has felt significant soreness in this region, for which she has  been trying to use Vaseline that soothes the pain.  She is not following any specific diet but has changed her dietary lifestyle habits and has been losing weight intentionally, close to 30 pounds in the last 5 months.  Notably, she reports that she is to have episodes of regurgitation and occasional vomiting that have resolved after she implemented the use of Protonix.  She has not vomited in a year.  Most recent abdominal imaging was performed on 10/01/2019 which showed hepatic steatosis and diverticulosis but no presence of any other alterations.  The patient denies having any nausea, vomiting, fever, chills, melena, hematemesis, diarrhea, jaundice, pruritus.  Last EGD: 07/29/2019 -normal Last Colonoscopy: 07/29/2019 -sigmoid diverticulosis  FHx: neg for any gastrointestinal/liver disease, father had colorectal cancer in his 59s Social: smokes and drinks alcohol on the weekends sometimes, no illicit drug use Surgical: c-section,  Past Medical History: Past Medical History:  Diagnosis Date  . Acid reflux   . Diverticulosis   . GERD (gastroesophageal reflux disease)   . HSV-2 (herpes simplex virus 2) infection   . Hx of chlamydia infection   . Nexplanon insertion 02/07/2014   Inserted left arm 02/07/14  . Nexplanon removal 02/07/2014  . Obesity   . Pain in right hip 08/09/2012  . Reflux 01/19/2014  . Thyroid disease     Past Surgical History: Past Surgical History:  Procedure Laterality Date  . CESAREAN SECTION    . COLONOSCOPY N/A 09/01/2013   Procedure: COLONOSCOPY;  Surgeon: Rogene Houston, MD;  [facility];  Service: Endoscopy;  Laterality: N/A;  100  . COLONOSCOPY WITH PROPOFOL N/A 07/29/2019   Procedure: COLONOSCOPY WITH PROPOFOL;  Surgeon: Rogene Houston, MD;  [facility];  Service: Endoscopy;  Laterality: N/A;  200  . ESOPHAGOGASTRODUODENOSCOPY (EGD) WITH PROPOFOL N/A 07/29/2019   Procedure: ESOPHAGOGASTRODUODENOSCOPY (EGD) WITH PROPOFOL;  Surgeon:  Rogene Houston, MD;  [facility];  Service: Endoscopy;  Laterality: N/A;    Family History: Family History  Problem Relation Age of Onset  . Hypertension Mother   . Diabetes Mother   . COPD Mother   . Hypertension Sister   . Cancer Maternal Grandfather   . Cancer Father     Social History: Social History   Tobacco Use  Smoking Status Current Some Day Smoker  . Packs/day: 0.50  . Types: Cigarettes  Smokeless Tobacco Never Used  Tobacco Comment   on the weekends when she drinks   Social History   Substance and Sexual Activity  Alcohol Use Yes   Comment: on weekends   Social History   Substance and Sexual Activity  Drug Use No    Allergies: Allergies  Allergen Reactions  . Keflex [Cephalexin] Itching and Rash    Medications: Current Outpatient Medications  Medication Sig Dispense Refill  . albuterol (PROVENTIL HFA;VENTOLIN HFA) 108 (90 Base) MCG/ACT inhaler Inhale 2 puffs into the lungs every 6 (six) hours as needed for wheezing or shortness of breath. 1 Inhaler 2  . escitalopram (LEXAPRO) 20 MG tablet Take 30 mg by mouth daily.    . hydrocortisone cream (PREPARATION H) 1 % Apply 1 application topically daily as needed for itching.    . levothyroxine (SYNTHROID) 50 MCG tablet Take 50 mcg by mouth daily before breakfast.    . meclizine (ANTIVERT) 25 MG tablet Take 25 mg by mouth 3 (three) times daily as needed for dizziness.    . Multiple Vitamin (MULTIVITAMIN WITH MINERALS) TABS tablet Take 1 tablet by mouth daily.    . pantoprazole (PROTONIX) 40 MG tablet Take 40 mg by mouth daily.     . polyethylene glycol powder (MIRALAX) 17 GM/SCOOP powder Take 17 g by mouth daily. (Patient taking differently: Take 17 g by mouth daily as needed for mild constipation or moderate constipation. ) 850 g 1  . pregabalin (LYRICA) 75 MG capsule Take 150 mg by mouth daily as needed (for pain).    Marland Kitchen tiZANidine (ZANAFLEX) 2 MG tablet Take 2 mg by mouth every 8 (eight) hours  as needed for muscle spasms.     . hyoscyamine (LEVSIN SL) 0.125 MG SL tablet Place 1 tablet (0.125 mg total) under the tongue every 8 (eight) hours as needed for cramping. 30 tablet 0   No current facility-administered medications for this visit.    Review of Systems: GENERAL: negative for malaise, night sweats HEENT: No changes in hearing or vision, no nose bleeds or other nasal problems. NECK: Negative for lumps, goiter, pain and significant neck swelling RESPIRATORY: Negative for cough, wheezing CARDIOVASCULAR: Negative for chest pain, leg swelling, palpitations, orthopnea GI: SEE HPI MUSCULOSKELETAL: Negative for joint pain or swelling, back pain, and muscle pain. SKIN: Negative for lesions, rash PSYCH: Negative for sleep disturbance, mood disorder and recent psychosocial stressors. HEMATOLOGY Negative for prolonged bleeding, bruising easily, and swollen nodes. ENDOCRINE: Negative for cold or heat intolerance, polyuria, polydipsia and goiter. NEURO: negative for tremor, gait imbalance, syncope and seizures. The remainder of the review of systems is noncontributory.   Physical Exam: BP (!) 127/93 (BP Location: Right Arm, Patient Position: Sitting, Cuff Size: Large)   Pulse 93   Temp 98.8 F (37.1  C) (Oral)   Ht 5\' 4"  (1.626 m)   Wt (!) 353 lb 1.6 oz (160.2 kg)   BMI 60.61 kg/m  GENERAL: The patient is AO x3, in no acute distress. Obese. HEENT: Head is normocephalic and atraumatic. EOMI are intact. Mouth is well hydrated and without lesions. NECK: Supple. No masses LUNGS: Clear to auscultation. No presence of rhonchi/wheezing/rales. Adequate chest expansion HEART: RRR, normal s1 and s2. ABDOMEN: tender to palpation in the right side of her abdomen and in the epigastric area but no guarding, no peritoneal signs, and nondistended. BS +. No masses. RECTAL: no external lesions, no rash, normal external anal tone but increased tone in internal sphincter when patient was asked to  relax contraction, no blood or masses palpated. EXTREMITIES: Without any cyanosis, clubbing, rash, lesions or edema. NEUROLOGIC: AOx3, no focal motor deficit. SKIN: no jaundice, no rashes  Imaging/Labs: as above  I personally reviewed and interpreted the available labs, imaging and endoscopic files.  Impression and Plan: RAYVIN ABID is a 44 y.o. female with PMH IBS-M, GERD, diverticulosis, hypothyroidism, who presents for follow up of rectal discomfort.  The patient has presented a constellation of symptoms consistent of intermittent diarrhea and constipation along with perianal discomfort and abdominal pain.  She has had endoscopic investigations in the past that have been negative including an EGD and a colonoscopy.  Also had recent abdominal imaging that was unremarkable for any alterations explaining her symptoms.  Due to the chronicity of her symptomatology, it is very likely her symptoms are explained by IBS-M which will need management with an adequate balance of antinociceptive medications but also dietary lifestyle changes.  Due to this, the patient was counseled to implement a low FODMAP diet and she will need to take Levsin as needed to decrease his abdominal cramping episodes.  I advised her to switch her fiber supplement to Benefiber to increase the bulk of his stool but also to stop the MiraLAX for now as these may cause worsening diarrhea.  We will check for celiac serologies today.  She is having significant rectal discomfort at the moment with presence of possible internal sphincter contraction which raises a concern for dyssynergic defecation.  We will readdress symptom improvement upon follow-up.  If the symptoms do not get better, we will consider performing an anorectal manometry and referral for biofeedback if consistent with dyssynergic defecation.  -Start Benefiber fiber supplements daily to increase stool bulk -Stop Miralax and Metamucil for now -Explained presumed  etiology of IBS symptoms. Patient was counseled about the benefit of implementing a low FODMAP to improve symptoms and recurrent episodes. A dietary list was provided to the patient. Also, the patient was counseled about the benefit of avoiding stressing situations and potential environmental triggers leading to symptomatology. -Start Levsin 1 tab every 8 hours as needed for abdominal cramping - Check TTG IgA and IgA  All questions were answered.      Harvel Quale, MD Gastroenterology and Hepatology [facility]

## 2020-01-20 LAB — IGA: Immunoglobulin A: 300 mg/dL (ref 47–310)

## 2020-01-20 LAB — TISSUE TRANSGLUTAMINASE, IGA: (tTG) Ab, IgA: <1 U/mL

## 2020-01-20 NOTE — Progress Notes (Signed)
Hi Crystal, Can you please call the patient and let her know her blood testing came back negative for celiac disease? Thanks,  [name], MD Gastroenterology and Hepatology [facility]

## 2020-01-26 ENCOUNTER — Telehealth (HOSPITAL_COMMUNITY): Payer: Self-pay | Admitting: Physical Therapy

## 2020-01-26 ENCOUNTER — Ambulatory Visit (HOSPITAL_COMMUNITY): Payer: Medicaid Other | Admitting: Physical Therapy

## 2020-01-26 NOTE — Telephone Encounter (Signed)
pt called to cancel today's appt due to she has a cough and she wants to wait on a covid test

## 2020-02-08 ENCOUNTER — Ambulatory Visit: Payer: Medicaid Other | Admitting: Physician Assistant

## 2020-03-02 ENCOUNTER — Other Ambulatory Visit: Payer: Self-pay

## 2020-03-02 ENCOUNTER — Emergency Department (HOSPITAL_COMMUNITY)
Admission: EM | Admit: 2020-03-02 | Discharge: 2020-03-02 | Discharge status: Against Medical Advice | Payer: Medicaid Other

## 2020-03-29 ENCOUNTER — Encounter (INDEPENDENT_AMBULATORY_CARE_PROVIDER_SITE_OTHER): Payer: Self-pay | Admitting: Gastroenterology

## 2020-03-29 ENCOUNTER — Other Ambulatory Visit: Payer: Self-pay

## 2020-03-29 ENCOUNTER — Ambulatory Visit (INDEPENDENT_AMBULATORY_CARE_PROVIDER_SITE_OTHER): Payer: Medicaid Other | Admitting: Gastroenterology

## 2020-03-29 VITALS — BP 118/90 | HR 88 | Temp 98.2°F | Ht 64.0 in | Wt 351.0 lb

## 2020-03-29 DIAGNOSIS — K582 Mixed irritable bowel syndrome: Secondary | ICD-10-CM | POA: Diagnosis not present

## 2020-03-29 NOTE — Patient Instructions (Signed)
Continue low FODMAP diet Continue Levsin as needed for abdominal cramping

## 2020-03-29 NOTE — Progress Notes (Signed)
Amy [name], M.D. Gastroenterology & Hepatology [facility] [address] [place], [place] [address]  Primary Care Physician: [facility], [facility] No address on file  I will communicate my assessment and recommendations to the referring MD via EMR. "Note: Occasional unusual wording and randomly placed punctuation marks may result from the use of speech recognition technology to transcribe this document"  Problems: 1. IBS-M  History of Present Illness: Amy [name] is a 44 y.o. female with PMH IBS-M, GERD, diverticulosis, hypothyroidism, who presents for follow up of diarrhea and abdominal pain.  The patient was last seen on 01/19/2020. At that time, the patient was advised to implement intake of Benefiber and to start a low FODMAP diet.  She had testing for celiac disease and serology which came back negative.  She was also given a prescription to take Levsin as needed for abdominal pain.  States she has felt better after she implemented the low FODMAP diet. States her stools are more formed since then as well. Reports feeling some abdominal pain occasionally in the LLQ described as abdominal cramping. She takes Levsin as needed which improves her symptoms.  She reports that overall her symptoms are much better than in the past Has noticed change in her appetite as she has had some nausea when smelling some food. Has lost 2 lb since last time seen in clinic.  The patient denies having any nausea, vomiting, fever, chills, hematochezia, melena, hematemesis, abdominal distention, abdominal pain, diarrhea, jaundice, pruritus or weight loss.  Past Medical History: Past Medical History:  Diagnosis Date  . Acid reflux   . Diverticulosis   . GERD (gastroesophageal reflux disease)   . HSV-2 (herpes simplex virus 2) infection   . Hx of chlamydia infection   . Nexplanon insertion 02/07/2014   Inserted left arm  02/07/14  . Nexplanon removal 02/07/2014  . Obesity   . Pain in right hip 08/09/2012  . Reflux 01/19/2014  . Thyroid disease     Past Surgical History: Past Surgical History:  Procedure Laterality Date  . CESAREAN SECTION    . COLONOSCOPY N/A 09/01/2013   Procedure: COLONOSCOPY;  Surgeon: Rogene Houston, MD;  [facility];  Service: Endoscopy;  Laterality: N/A;  100  . COLONOSCOPY WITH PROPOFOL N/A 07/29/2019   Procedure: COLONOSCOPY WITH PROPOFOL;  Surgeon: Rogene Houston, MD;  [facility];  Service: Endoscopy;  Laterality: N/A;  200  . ESOPHAGOGASTRODUODENOSCOPY (EGD) WITH PROPOFOL N/A 07/29/2019   Procedure: ESOPHAGOGASTRODUODENOSCOPY (EGD) WITH PROPOFOL;  Surgeon: Rogene Houston, MD;  [facility];  Service: Endoscopy;  Laterality: N/A;    Family History: Family History  Problem Relation Age of Onset  . Hypertension Mother   . Diabetes Mother   . COPD Mother   . Hypertension Sister   . Cancer Maternal Grandfather   . Cancer Father     Social History: Social History   Tobacco Use  Smoking Status Current Some Day Smoker  . Packs/day: 0.50  . Types: Cigarettes  Smokeless Tobacco Never Used  Tobacco Comment   on the weekends when she drinks   Social History   Substance and Sexual Activity  Alcohol Use Yes   Comment: on weekends   Social History   Substance and Sexual Activity  Drug Use No    Allergies: Allergies  Allergen Reactions  . Keflex [Cephalexin] Itching and Rash    Medications: Current Outpatient Medications  Medication Sig Dispense  Refill  . albuterol (PROVENTIL HFA;VENTOLIN HFA) 108 (90 Base) MCG/ACT inhaler Inhale 2 puffs into the lungs every 6 (six) hours as needed for wheezing or shortness of breath. 1 Inhaler 2  . cyclobenzaprine (FLEXERIL) 10 MG tablet Take 10 mg by mouth 3 (three) times daily as needed for muscle spasms.    Marland Kitchen escitalopram (LEXAPRO) 20 MG tablet Take 30 mg by mouth daily.    .  hydrocortisone cream 1 % Apply 1 application topically daily as needed for itching.    . hyoscyamine (LEVSIN SL) 0.125 MG SL tablet Place 1 tablet (0.125 mg total) under the tongue every 8 (eight) hours as needed for cramping. 30 tablet 0  . ibuprofen (ADVIL) 400 MG tablet Take 400 mg by mouth every 6 (six) hours as needed.    Marland Kitchen levothyroxine (SYNTHROID) 50 MCG tablet Take 50 mcg by mouth daily before breakfast.    . meclizine (ANTIVERT) 25 MG tablet Take 25 mg by mouth 3 (three) times daily as needed for dizziness.    . Multiple Vitamin (MULTIVITAMIN WITH MINERALS) TABS tablet Take 1 tablet by mouth daily.    . pantoprazole (PROTONIX) 40 MG tablet Take 40 mg by mouth daily.     . polyethylene glycol powder (MIRALAX) 17 GM/SCOOP powder Take 17 g by mouth daily. (Patient taking differently: Take 17 g by mouth daily as needed for mild constipation or moderate constipation.) 850 g 1  . pregabalin (LYRICA) 75 MG capsule Take 150 mg by mouth daily as needed (for pain).    . promethazine (PHENERGAN) 25 MG tablet Take 25 mg by mouth every 6 (six) hours as needed for nausea or vomiting.    Marland Kitchen tiZANidine (ZANAFLEX) 2 MG tablet Take 2 mg by mouth every 8 (eight) hours as needed for muscle spasms.      No current facility-administered medications for this visit.    Review of Systems: GENERAL: negative for malaise, night sweats HEENT: No changes in hearing or vision, no nose bleeds or other nasal problems. NECK: Negative for lumps, goiter, pain and significant neck swelling RESPIRATORY: Negative for cough, wheezing CARDIOVASCULAR: Negative for chest pain, leg swelling, palpitations, orthopnea GI: SEE HPI MUSCULOSKELETAL: Negative for joint pain or swelling, back pain, and muscle pain. SKIN: Negative for lesions, rash PSYCH: Negative for sleep disturbance, mood disorder and recent psychosocial stressors. HEMATOLOGY Negative for prolonged bleeding, bruising easily, and swollen nodes. ENDOCRINE: Negative  for cold or heat intolerance, polyuria, polydipsia and goiter. NEURO: negative for tremor, gait imbalance, syncope and seizures. The remainder of the review of systems is noncontributory.   Physical Exam: BP 118/90 (BP Location: Left Arm, Patient Position: Sitting, Cuff Size: Large)   Pulse 88   Temp 98.2 F (36.8 C) (Oral)   Ht 5\' 4"  (1.626 m)   Wt (!) 351 lb (159.2 kg)   BMI 60.25 kg/m  GENERAL: The patient is AO x3, in no acute distress. Obese. HEENT: Head is normocephalic and atraumatic. EOMI are intact. Mouth is well hydrated and without lesions. NECK: Supple. No masses LUNGS: Clear to auscultation. No presence of rhonchi/wheezing/rales. Adequate chest expansion HEART: RRR, normal s1 and s2. ABDOMEN: Soft, nontender, no guarding, no peritoneal signs, and nondistended. BS +. No masses. EXTREMITIES: Without any cyanosis, clubbing, rash, lesions or edema. NEUROLOGIC: AOx3, no focal motor deficit. SKIN: no jaundice, no rashes  Imaging/Labs: as above  I personally reviewed and interpreted the available labs, imaging and endoscopic files.  Impression and Plan: Amy [name] is a 44  y.o. female with PMH IBS-M, GERD, diverticulosis, hypothyroidism, who presents for follow up of diarrhea and abdominal pain.  Patient has presented major improvement in her symptoms after implementing dietary changes and with the use of antispasmodic medication to control her symptomatology.  Given the improvement of her symptoms, no further changes are warranted at this point and she can continue taking the Levsin as needed for abdominal pain.  - Continue low FODMAP diet - Continue Levsin as needed for abdominal cramping - RTC 1 year  All questions were answered.      Harvel Quale, MD Gastroenterology and Hepatology [facility]

## 2020-05-15 ENCOUNTER — Other Ambulatory Visit: Payer: Medicaid Other

## 2020-05-15 DIAGNOSIS — Z20822 Contact with and (suspected) exposure to covid-19: Secondary | ICD-10-CM

## 2020-05-16 LAB — NOVEL CORONAVIRUS, NAA: SARS-CoV-2, NAA: NOT DETECTED

## 2020-05-16 LAB — SARS-COV-2, NAA 2 DAY TAT

## 2020-07-20 ENCOUNTER — Ambulatory Visit (HOSPITAL_COMMUNITY): Payer: Medicaid Other | Attending: Nurse Practitioner | Admitting: Physical Therapy

## 2020-07-20 DIAGNOSIS — R29898 Other symptoms and signs involving the musculoskeletal system: Secondary | ICD-10-CM | POA: Insufficient documentation

## 2020-07-20 DIAGNOSIS — M6281 Muscle weakness (generalized): Secondary | ICD-10-CM | POA: Insufficient documentation

## 2020-07-20 DIAGNOSIS — M546 Pain in thoracic spine: Secondary | ICD-10-CM | POA: Insufficient documentation

## 2020-07-26 ENCOUNTER — Ambulatory Visit (HOSPITAL_COMMUNITY): Payer: Medicaid Other | Admitting: Physical Therapy

## 2020-08-02 ENCOUNTER — Encounter (HOSPITAL_COMMUNITY): Payer: Self-pay | Admitting: Physical Therapy

## 2020-08-02 ENCOUNTER — Other Ambulatory Visit: Payer: Self-pay

## 2020-08-02 ENCOUNTER — Ambulatory Visit (HOSPITAL_COMMUNITY): Payer: Medicaid Other | Admitting: Physical Therapy

## 2020-08-02 DIAGNOSIS — M6281 Muscle weakness (generalized): Secondary | ICD-10-CM

## 2020-08-02 DIAGNOSIS — R29898 Other symptoms and signs involving the musculoskeletal system: Secondary | ICD-10-CM | POA: Diagnosis present

## 2020-08-02 DIAGNOSIS — M546 Pain in thoracic spine: Secondary | ICD-10-CM

## 2020-08-02 NOTE — Therapy (Signed)
[place], [place], [address] Phone: [phone]   Fax:  [phone]  Physical Therapy Evaluation  Patient Details  Name: Amy [name] MRN: [id number] Date of Birth: 07/01/1975 Referring Provider (PT): Elfredia Nevins NP   Encounter Date: 08/02/2020   PT End of Session - 08/02/20 1130    Visit Number 1    Number of Visits 12    Date for PT Re-Evaluation 09/13/20    Authorization Type [employer]    Authorization Time Period 12 visits requested 4/14-5/26    Authorization - Visit Number 0    Authorization - Number of Visits 12    PT Start Time 1055   arrives late   PT Stop Time 1125    PT Time Calculation (min) 30 min    Activity Tolerance Patient limited by pain    Behavior During Therapy [facility] for tasks assessed/performed           Past Medical History:  Diagnosis Date  . Acid reflux   . Diverticulosis   . GERD (gastroesophageal reflux disease)   . HSV-2 (herpes simplex virus 2) infection   . Hx of chlamydia infection   . Nexplanon insertion 02/07/2014   Inserted left arm 02/07/14  . Nexplanon removal 02/07/2014  . Obesity   . Pain in right hip 08/09/2012  . Reflux 01/19/2014  . Thyroid disease     Past Surgical History:  Procedure Laterality Date  . CESAREAN SECTION    . COLONOSCOPY N/A 09/01/2013   Procedure: COLONOSCOPY;  Surgeon: Rogene Houston, MD;  [facility];  Service: Endoscopy;  Laterality: N/A;  100  . COLONOSCOPY WITH PROPOFOL N/A 07/29/2019   Procedure: COLONOSCOPY WITH PROPOFOL;  Surgeon: Rogene Houston, MD;  [facility];  Service: Endoscopy;  Laterality: N/A;  200  . ESOPHAGOGASTRODUODENOSCOPY (EGD) WITH PROPOFOL N/A 07/29/2019   Procedure: ESOPHAGOGASTRODUODENOSCOPY (EGD) WITH PROPOFOL;  Surgeon: Rogene Houston, MD;  [facility];  Service: Endoscopy;  Laterality: N/A;    There were no vitals filed for this visit.    Subjective Assessment  - 08/02/20 1056    Subjective Patient is a 45 y.o. female who presents to physical therapy with c/o chronic thoracic spine pain. She has had injections without relief. They think its in her muscles that's bothering her. She started having pain in about 2016 with insidious onset. If she lays on her right side she feels sick and that started years ago. She states most R sided symptoms from mid/upper back to lower back. Patient states increase prolonged positioning. She has not found anything that makes it better. Patient states her main goal is to improve pain and sleep. She denies symptoms into UEs and headaches.    Limitations House hold activities;Standing;Sitting;Walking    Patient Stated Goals improve pain and sleep    Currently in Pain? Yes    Pain Score 7     Pain Location Back    Pain Orientation Right;Mid    Pain Descriptors / Indicators Sharp;Aching;Pressure    Pain Type Chronic pain    Pain Onset More than a month ago    Pain Frequency Constant              [facility] PT Assessment - 08/02/20 0001      Assessment   Medical Diagnosis Thoracic spondylosis, chronic myofascial pain    Referring Provider (PT) Elfredia Nevins NP    Onset Date/Surgical Date  08/04/14    Next MD Visit in a few months    Prior Therapy for feet      Precautions   Precautions None      Restrictions   Weight Bearing Restrictions No      Balance Screen   Has the patient fallen in the past 6 months No    Has the patient had a decrease in activity level because of a fear of falling?  No    Is the patient reluctant to leave their home because of a fear of falling?  No      Prior Function   Level of Independence Independent    Vocation Unemployed      Cognition   Overall Cognitive Status Within Functional Limits for tasks assessed      Observation/Other Assessments   Observations Ambulates without AD    Focus on Therapeutic Outcomes (FOTO)  n/a      ROM / Strength   AROM / PROM / Strength AROM;Strength       AROM   Overall AROM Comments shoulder functional ER: T7 bilateral, IR T1 bilateral, shoulder flex/abd WFL, pain throughout shoulder testing in t/sp; pain in t/sp  and right ribs with thoracolumbar AROM    AROM Assessment Site Thoracic    Thoracic Flexion 25% limited    Thoracic Extension 50% limited, most extension comes from lumbar region    Thoracic - Right Side Bend 25% limited pain    Thoracic - Left Side Bend 0% limited    Thoracic - Right Rotation 50% limited    Thoracic - Left Rotation 0% limited      Strength   Overall Strength Comments periscap pain with shoulder testing    Strength Assessment Site Hip;Knee;Ankle;Shoulder;Wrist;Hand;Elbow    Right/Left Shoulder Right;Left    Right Shoulder Flexion 4/5    Right Shoulder ABduction 4/5    Left Shoulder Flexion 4+/5    Left Shoulder ABduction 4+/5    Right/Left Elbow Right;Left    Right Elbow Flexion 5/5    Right Elbow Extension 5/5    Left Elbow Flexion 5/5    Left Elbow Extension 5/5    Right/Left Wrist Right;Left    Right Wrist Flexion 5/5    Right Wrist Extension 5/5    Left Wrist Flexion 5/5    Left Wrist Extension 5/5    Right/Left hand Right;Left    Right Hand Gross Grasp Functional    Left Hand Gross Grasp Functional    Right/Left Hip Right;Left    Right Hip Flexion 3+/5    Left Hip Flexion 4-/5    Right/Left Knee Right;Left    Right Knee Flexion 5/5    Right Knee Extension 5/5    Left Knee Flexion 5/5    Left Knee Extension 5/5    Right/Left Ankle Left;Right    Right Ankle Dorsiflexion 5/5    Left Ankle Dorsiflexion 5/5      Palpation   Spinal mobility grossly hypomobile thoracic and lumbar CPA, painful thoracic    Palpation comment grossly tender throughout thoracic and lumbar paraspinals, greatest tenderness in R periscapular region                      Objective measurements completed on examination: See above findings.               PT Education - 08/02/20 1055     Education Details Patient educated on exam findings, POC, scope of PT  Person(s) Educated Patient    Methods Explanation;Demonstration    Comprehension Verbalized understanding;Returned demonstration            PT Short Term Goals - 08/02/20 1132      PT SHORT TERM GOAL #1   Title Patient will be independent with HEP in order to improve functional outcomes.    Time 3    Period Weeks    Status New    Target Date 08/23/20      PT SHORT TERM GOAL #2   Title Patient will report at least 25% improvement in symptoms for improved quality of life.    Time 3    Period Weeks    Status New    Target Date 08/23/20             PT Long Term Goals - 08/02/20 1132      PT LONG TERM GOAL #1   Title Patient will report at least 75% improvement in symptoms for improved quality of life.    Time 6    Period Weeks    Status New    Target Date 09/13/20      PT LONG TERM GOAL #2   Title Patient will demonstrate at least 25% improvement in thoracolumbar ROM in all restricted planes for improved ability to move trunk while completing household chores.    Time 6    Period Weeks    Status New    Target Date 09/13/20      PT LONG TERM GOAL #3   Title Patient will improve bilateral shoulder strength to 5/5 in order to assist with lifting overhead in her kitchen.    Time 6    Period Weeks    Status New    Target Date 09/13/20                  Plan - 08/02/20 1134    Clinical Impression Statement Patient is a 45 y.o. female who presents to physical therapy with c/o chronic thoracic spine pain. She presents with pain limited deficits in thoracolumbar strength, ROM, endurance, postural impairments, spinal mobility and functional mobility with ADL. She is having to modify and restrict ADL as indicated by subjective information and objective measures which is affecting overall participation. Patient will benefit from skilled physical therapy in order to improve function and reduce  impairment.    Personal Factors and Comorbidities Fitness;Behavior Pattern;Time since onset of injury/illness/exacerbation;Comorbidity 2    Comorbidities chronic back pain, obesity    Examination-Activity Limitations Locomotion Level;Transfers;Bed Mobility;Reach Overhead;Bend;Carry;Lift;Stand;Stairs;Squat;Sit;Sleep    Examination-Participation Restrictions Meal Prep;Occupation;Cleaning;Community Activity;Yard Work;Volunteer;Shop;Laundry    Stability/Clinical Decision Making Evolving/Moderate complexity    Clinical Decision Making Moderate    Rehab Potential Fair    PT Frequency 2x / week    PT Duration 6 weeks    PT Treatment/Interventions ADLs/Self Care Home Management;Aquatic Therapy;Cryotherapy;Electrical Stimulation;Moist Heat;Traction;Iontophoresis 4mg /ml Dexamethasone;DME Instruction;Contrast Bath;Gait training;Stair training;Functional mobility training;Therapeutic activities;Therapeutic exercise;Balance training;Neuromuscular re-education;Patient/family education;Manual techniques;Manual lymph drainage;Compression bandaging;Passive range of motion;Dry needling;Energy conservation;Spinal Manipulations;Joint Manipulations;Splinting;Taping    PT Next Visit Plan possibly began manual with mobs/STM to ribs, thoracic spine, periscap, paraspinals, core strengthening, postural strengthening, shoulder and hip strength and progress as able    Consulted and Agree with Plan of Care Patient           Patient will benefit from skilled therapeutic intervention in order to improve the following deficits and impairments:  Decreased range of motion,Obesity,Increased muscle spasms,Decreased activity tolerance,Hypomobility,Impaired perceived functional ability,Pain,Impaired flexibility,Improper body mechanics,Postural dysfunction,Decreased  strength,Decreased mobility  Visit Diagnosis: Pain in thoracic spine  Muscle weakness (generalized)  Other symptoms and signs involving the musculoskeletal  system     Problem List Patient Active Problem List   Diagnosis Date Noted  . IBS (irritable bowel syndrome) 01/19/2020  . Achilles tendinitis, left leg 05/27/2016  . Achilles tendinitis, right leg 05/27/2016  . Diarrhea 07/15/2015  . Abdominal pain, chronic, right lower quadrant 08/15/2014  . Rectal discomfort   . Nexplanon insertion 02/07/2014  . Nexplanon removal 02/07/2014  . Reflux 01/19/2014  . Heme positive stool 08/04/2013  . Pain in right hip 08/09/2012  . Hx of chlamydia infection 08/05/2012  . 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 Obesity 08/05/2012    11:38 AM, 08/02/20 Mearl Latin PT, DPT Physical Therapist at [place], [place], [address] Phone: [phone]   Fax:  [phone]  Name: Amy [name] MRN: [id number] Date of Birth: 12-17-1975

## 2020-08-07 ENCOUNTER — Ambulatory Visit (HOSPITAL_COMMUNITY): Payer: Medicaid Other | Admitting: Physical Therapy

## 2020-08-10 ENCOUNTER — Telehealth (HOSPITAL_COMMUNITY): Payer: Self-pay | Admitting: Physical Therapy

## 2020-08-10 ENCOUNTER — Ambulatory Visit (HOSPITAL_COMMUNITY): Payer: Medicaid Other | Admitting: Physical Therapy

## 2020-08-10 NOTE — Telephone Encounter (Signed)
No Show #1. Called and left VM about missed appointment and about upcoming appointment. 9:37 AM, 08/10/20 Jerene Pitch, DPT Physical Therapy with [facility]  [phone] office

## 2020-08-14 ENCOUNTER — Encounter (HOSPITAL_COMMUNITY): Payer: Medicaid Other | Admitting: Physical Therapy

## 2020-08-16 ENCOUNTER — Encounter (HOSPITAL_COMMUNITY): Payer: Self-pay | Admitting: Physical Therapy

## 2020-08-16 ENCOUNTER — Encounter (HOSPITAL_COMMUNITY): Payer: Medicaid Other | Admitting: Physical Therapy

## 2020-08-16 ENCOUNTER — Telehealth (HOSPITAL_COMMUNITY): Payer: Self-pay | Admitting: Physical Therapy

## 2020-08-16 NOTE — Therapy (Signed)
[place], [place], [address] Phone: [phone]   Fax:  [phone]  Patient Details  Name: Amy [name] MRN: [id number] Date of Birth: 04-10-1976 Referring Provider:  No ref. provider found  Encounter Date: 08/16/2020    PHYSICAL THERAPY DISCHARGE SUMMARY  Visits from Start of Care: 1  Current functional level related to goals / functional outcomes: Unknown as patient has not returned. Patient being discharged due to 3 consecutive no shows.   Remaining deficits: Unknown as patient has not returned.   Education / Equipment: Exam findings, POC  Plan: Patient agrees to discharge.  Patient goals were not met. Patient is being discharged due to not returning since the last visit.  ?????      9:40 AM, 08/16/20  Mearl Latin PT, DPT Physical Therapist at [place], [place], [address] Phone: [phone]   Fax:  [phone]

## 2020-08-16 NOTE — Telephone Encounter (Signed)
Patient no show #3. Spoke with patient who states she has been sick. Educated her on that she will be discharged due to violation of attendance policy and that she would need a new order if she wanted to continue physical therapy. Patient stated she would get a new order once she was feeling better.   9:38 AM, 08/16/20 Mearl Latin PT, DPT Physical Therapist at [facility]

## 2020-08-22 ENCOUNTER — Encounter (HOSPITAL_COMMUNITY): Payer: Medicaid Other | Admitting: Physical Therapy

## 2020-08-24 ENCOUNTER — Encounter (HOSPITAL_COMMUNITY): Payer: Medicaid Other

## 2020-08-27 ENCOUNTER — Encounter (HOSPITAL_COMMUNITY): Payer: Medicaid Other | Admitting: Physical Therapy

## 2020-08-29 ENCOUNTER — Ambulatory Visit (HOSPITAL_COMMUNITY): Payer: Medicaid Other | Admitting: Physical Therapy

## 2020-09-04 ENCOUNTER — Encounter (HOSPITAL_COMMUNITY): Payer: Medicaid Other | Admitting: Physical Therapy

## 2020-09-06 ENCOUNTER — Encounter (HOSPITAL_COMMUNITY): Payer: Medicaid Other | Admitting: Physical Therapy

## 2020-09-11 ENCOUNTER — Encounter (HOSPITAL_COMMUNITY): Payer: Medicaid Other | Admitting: Physical Therapy

## 2020-09-13 ENCOUNTER — Encounter (HOSPITAL_COMMUNITY): Payer: Medicaid Other | Admitting: Physical Therapy

## 2021-01-11 ENCOUNTER — Ambulatory Visit
Admission: EM | Admit: 2021-01-11 | Discharge: 2021-01-11 | Disposition: A | Discharge status: Home / Self Care | Payer: Medicaid Other | Attending: Emergency Medicine | Admitting: Emergency Medicine

## 2021-01-11 ENCOUNTER — Encounter: Payer: Self-pay | Admitting: Emergency Medicine

## 2021-01-11 ENCOUNTER — Other Ambulatory Visit: Payer: Self-pay

## 2021-01-11 DIAGNOSIS — Z20822 Contact with and (suspected) exposure to covid-19: Secondary | ICD-10-CM

## 2021-01-11 NOTE — ED Triage Notes (Signed)
Wants covid test due to exposure.

## 2021-01-12 LAB — NOVEL CORONAVIRUS, NAA: SARS-CoV-2, NAA: NOT DETECTED

## 2021-01-12 LAB — SARS-COV-2, NAA 2 DAY TAT

## 2021-11-19 ENCOUNTER — Encounter: Payer: Self-pay | Admitting: Emergency Medicine

## 2021-11-19 ENCOUNTER — Ambulatory Visit
Admission: EM | Admit: 2021-11-19 | Discharge: 2021-11-19 | Disposition: A | Discharge status: Home / Self Care | Payer: Medicaid Other | Attending: Nurse Practitioner | Admitting: Nurse Practitioner

## 2021-11-19 DIAGNOSIS — J069 Acute upper respiratory infection, unspecified: Secondary | ICD-10-CM

## 2021-11-19 DIAGNOSIS — R0989 Other specified symptoms and signs involving the circulatory and respiratory systems: Secondary | ICD-10-CM

## 2021-11-19 HISTORY — DX: Unspecified cirrhosis of liver: K74.60

## 2021-11-19 MED ORDER — FLUTICASONE PROPIONATE 50 MCG/ACT NA SUSP: 2.0000 | Freq: Every day | NASAL | 0 refills | Status: DC

## 2021-11-19 MED ORDER — DOXYCYCLINE HYCLATE 100 MG PO TABS: 100.0000 mg | ORAL_TABLET | Freq: Two times a day (BID) | ORAL | 0 refills | Status: DC

## 2021-11-19 MED ORDER — CETIRIZINE HCL 10 MG PO TABS: 10.0000 mg | ORAL_TABLET | Freq: Every day | ORAL | 0 refills | Status: DC

## 2021-11-19 MED ORDER — ALBUTEROL SULFATE HFA 108 (90 BASE) MCG/ACT IN AERS: 2.0000 | INHALATION_SPRAY | Freq: Four times a day (QID) | RESPIRATORY_TRACT | 0 refills | Status: DC | PRN

## 2021-11-19 MED ORDER — PROMETHAZINE-DM 6.25-15 MG/5ML PO SYRP: 5.0000 mL | ORAL_SOLUTION | Freq: Four times a day (QID) | ORAL | 0 refills | Status: DC | PRN

## 2021-11-19 NOTE — ED Provider Notes (Signed)
[facility]    CSN: [id number] Arrival date & time: 11/19/21  1223      History   Chief Complaint No chief complaint on file.   HPI Amy [name] is a 46 y.o. female.   The history is provided by the patient.   Patient presents for complaints of cough, head congestion, and chest congestion that has been present for the past 3 days.  Patient states she attended a funeral prior to her symptoms starting and by that evening, her throat was getting sore.  She states since that time her throat symptoms have improved.  She continues to have a productive cough and states that she has "a lot of mucus coming out of her nose".  She denies fever, ear pain, runny nose, or GI symptoms.  Patient states she has been taking Sudafed and Robitussin for her symptoms, she feels that her symptoms are not improved at this time.  Past Medical History:  Diagnosis Date   Acid reflux    Diverticulosis    GERD (gastroesophageal reflux disease)    Hepatic cirrhosis (HCC)    HSV-2 (herpes simplex virus 2) infection    Hx of chlamydia infection    Nexplanon insertion 02/07/2014   Inserted left arm 02/07/14   Nexplanon removal 02/07/2014   Obesity    Pain in right hip 08/09/2012   Reflux 01/19/2014   Thyroid disease     Patient Active Problem List   Diagnosis Date Noted   IBS (irritable bowel syndrome) 01/19/2020   Achilles tendinitis, left leg 05/27/2016   Achilles tendinitis, right leg 05/27/2016   Diarrhea 07/15/2015   Abdominal pain, chronic, right lower quadrant 08/15/2014   Rectal discomfort    Nexplanon insertion 02/07/2014   Nexplanon removal 02/07/2014   Reflux 01/19/2014   Heme positive stool 08/04/2013   Pain in right hip 08/09/2012   Hx of chlamydia infection 08/05/2012   HSV-2 (herpes simplex virus 2) infection 08/05/2012   Obesity 08/05/2012    Past Surgical History:  Procedure Laterality Date   CESAREAN SECTION     COLONOSCOPY N/A 09/01/2013   Procedure:  COLONOSCOPY;  Surgeon: Rogene Houston, MD;  [facility];  Service: Endoscopy;  Laterality: N/A;  100   COLONOSCOPY WITH PROPOFOL N/A 07/29/2019   Procedure: COLONOSCOPY WITH PROPOFOL;  Surgeon: Rogene Houston, MD;  Location: [facility];  Service: Endoscopy;  Laterality: N/A;  200   ESOPHAGOGASTRODUODENOSCOPY (EGD) WITH PROPOFOL N/A 07/29/2019   Procedure: ESOPHAGOGASTRODUODENOSCOPY (EGD) WITH PROPOFOL;  Surgeon: Rogene Houston, MD;  Location: [facility];  Service: Endoscopy;  Laterality: N/A;    OB History     Gravida  1   Para  1   Term  1   Preterm      AB      Living  1      SAB      IAB      Ectopic      Multiple      Live Births  1            Home Medications    Prior to Admission medications   Medication Sig Start Date End Date Taking? Authorizing Provider  albuterol (VENTOLIN HFA) 108 (90 Base) MCG/ACT inhaler Inhale 2 puffs into the lungs every 6 (six) hours as needed for wheezing or shortness of breath. 11/19/21  Yes Bebe Moncure-Warren, Alda Lea, NP  cetirizine (ZYRTEC ALLERGY) 10 MG tablet Take 1 tablet (10 mg total) by  mouth daily. 11/19/21  Yes Maddon Horton-Warren, Alda Lea, NP  doxycycline (VIBRA-TABS) 100 MG tablet Take 1 tablet (100 mg total) by mouth 2 (two) times daily. 11/24/21  Yes Keyly Baldonado-Warren, Alda Lea, NP  fluticasone (FLONASE) 50 MCG/ACT nasal spray Place 2 sprays into both nostrils daily. 11/19/21  Yes Maryjean Corpening-Warren, Alda Lea, NP  promethazine-dextromethorphan (PROMETHAZINE-DM) 6.25-15 MG/5ML syrup Take 5 mLs by mouth 4 (four) times daily as needed for cough. 11/19/21  Yes Fauna Neuner-Warren, Alda Lea, NP  cyclobenzaprine (FLEXERIL) 10 MG tablet Take 10 mg by mouth 3 (three) times daily as needed for muscle spasms.    [provider]  escitalopram (LEXAPRO) 20 MG tablet Take 30 mg by mouth daily.    [provider]  hydrocortisone cream 1 % Apply 1 application topically daily as needed for itching.    [provider]   hyoscyamine (LEVSIN SL) 0.125 MG SL tablet Place 1 tablet (0.125 mg total) under the tongue every 8 (eight) hours as needed for cramping. 01/19/20   Harvel Quale, MD  ibuprofen (ADVIL) 400 MG tablet Take 400 mg by mouth every 6 (six) hours as needed.    [provider]  levothyroxine (SYNTHROID) 50 MCG tablet Take 50 mcg by mouth daily before breakfast.    [provider]  meclizine (ANTIVERT) 25 MG tablet Take 25 mg by mouth 3 (three) times daily as needed for dizziness.    [provider]  Multiple Vitamin (MULTIVITAMIN WITH MINERALS) TABS tablet Take 1 tablet by mouth daily.    [provider]  pantoprazole (PROTONIX) 40 MG tablet Take 40 mg by mouth daily.     [provider]  polyethylene glycol powder (MIRALAX) 17 GM/SCOOP powder Take 17 g by mouth daily. Patient taking differently: Take 17 g by mouth daily as needed for mild constipation or moderate constipation. 07/29/19   Rehman, Mechele Dawley, MD  pregabalin (LYRICA) 75 MG capsule Take 150 mg by mouth daily as needed (for pain).    [provider]  promethazine (PHENERGAN) 25 MG tablet Take 25 mg by mouth every 6 (six) hours as needed for nausea or vomiting.    [provider]  tiZANidine (ZANAFLEX) 2 MG tablet Take 2 mg by mouth every 8 (eight) hours as needed for muscle spasms.     [provider]    Family History Family History  Problem Relation Age of Onset   Hypertension Mother    Diabetes Mother    COPD Mother    Hypertension Sister    Cancer Maternal Grandfather    Cancer Father     Social History Social History   Tobacco Use   Smoking status: Some Days    Packs/day: 0.50    Types: Cigarettes   Smokeless tobacco: Never   Tobacco comments:    on the weekends when she drinks  Substance Use Topics   Alcohol use: Yes    Comment: on weekends   Drug use: No     Allergies   Keflex [cephalexin]   Review of Systems Review of  Systems Per HPI  Physical Exam Triage Vital Signs ED Triage Vitals  Enc Vitals Group     BP 11/19/21 1243 (!) 136/90     Pulse Rate 11/19/21 1243 83     Resp 11/19/21 1243 18     Temp 11/19/21 1243 99.2 F (37.3 C)     Temp Source 11/19/21 1243 Oral     SpO2 11/19/21 1243 97 %  Weight --      Height --      Head Circumference --      Peak Flow --      Pain Score 11/19/21 1244 6     Pain Loc --      Pain Edu? --      Excl. in [place]? --    No data found.  Updated Vital Signs BP (!) 136/90 (BP Location: Right Arm)   Pulse 83   Temp 99.2 F (37.3 C) (Oral)   Resp 18   SpO2 97%   Visual Acuity Right Eye Distance:   Left Eye Distance:   Bilateral Distance:    Right Eye Near:   Left Eye Near:    Bilateral Near:     Physical Exam Vitals and nursing note reviewed.  Constitutional:      General: She is not in acute distress.    Appearance: Normal appearance.  HENT:     Head: Normocephalic.     Right Ear: Tympanic membrane, ear canal and external ear normal.     Left Ear: Tympanic membrane, ear canal and external ear normal.     Nose: Congestion present.     Right Turbinates: Enlarged and swollen.     Left Turbinates: Enlarged and swollen.     Right Sinus: Maxillary sinus tenderness and frontal sinus tenderness present.     Left Sinus: Maxillary sinus tenderness and frontal sinus tenderness present.     Mouth/Throat:     Lips: Pink.     Mouth: Mucous membranes are moist.     Pharynx: Uvula midline. Posterior oropharyngeal erythema present. No oropharyngeal exudate or uvula swelling.     Tonsils: 0 on the right.  Eyes:     Extraocular Movements: Extraocular movements intact.     Conjunctiva/sclera: Conjunctivae normal.     Pupils: Pupils are equal, round, and reactive to light.  Cardiovascular:     Rate and Rhythm: Normal rate and regular rhythm.     Pulses: Normal pulses.     Heart sounds: Normal heart sounds.  Pulmonary:     Breath sounds: Wheezing  (posterior LUL) present.  Abdominal:     General: Bowel sounds are normal.     Palpations: Abdomen is soft.  Musculoskeletal:     Cervical back: Normal range of motion.  Lymphadenopathy:     Cervical: No cervical adenopathy.  Skin:    General: Skin is dry.  Neurological:     General: No focal deficit present.     Mental Status: She is alert and oriented to person, place, and time.  Psychiatric:        Mood and Affect: Mood normal.        Behavior: Behavior normal.      UC Treatments / Results  Labs (all labs ordered are listed, but only abnormal results are displayed) Labs Reviewed  COVID-19, FLU A+B NAA    EKG   Radiology No results found.  Procedures Procedures (including critical care time)  Medications Ordered in UC Medications - No data to display  Initial Impression / Assessment and Plan / UC Course  I have reviewed the triage vital signs and the nursing notes.  Pertinent labs & imaging results that were available during my care of the patient were reviewed by me and considered in my medical decision making (see chart for details).  Patient presents for complaints of upper respiratory symptoms that been present for the past 3 days.  On exam, patient does  have wheezing in the posterior left upper lobe.  Her vital signs are stable, exam is otherwise benign.  COVID/flu test is pending.  Will treat patient symptomatically with Promethazine DM, Zyrtec, and fluticasone.  Albuterol inhaler was also prescribed to help with her wheezing.  Will approach patient's symptoms with a watch and wait strategy by providing a prescription for doxycycline for her to start on 11/24/2021 if symptoms worsen.  Supportive care recommendations were provided to the patient.  Patient advised to follow-up in this clinic or with her PCP if symptoms do not improve. Final Clinical Impressions(s) / UC Diagnoses   Final diagnoses:  Symptoms of upper respiratory infection (URI)  Viral upper  respiratory tract infection with cough     Discharge Instructions      Take medication as prescribed.  May continue waiting Sudafed as needed. Increase fluids and allow for plenty of rest. Recommend Tylenol or ibuprofen as needed for pain, fever, or general discomfort. Warm salt water gargles 3-4 times daily to help with throat pain or discomfort. As discussed, if your symptoms do not improve by 11/24/2021, pick up your prescription for the antibiotic at your preferred pharmacy. Recommend using a humidifier at bedtime during sleep to help with cough and nasal congestion. Sleep elevated on 2 pillows while cough symptoms persist. Follow-up if your symptoms do not improve.      ED Prescriptions     Medication Sig Dispense Auth. Provider   promethazine-dextromethorphan (PROMETHAZINE-DM) 6.25-15 MG/5ML syrup Take 5 mLs by mouth 4 (four) times daily as needed for cough. 140 mL Apolinar Bero-Warren, Alda Lea, NP   cetirizine (ZYRTEC ALLERGY) 10 MG tablet Take 1 tablet (10 mg total) by mouth daily. 30 tablet Savoy Somerville-Warren, Alda Lea, NP   fluticasone (FLONASE) 50 MCG/ACT nasal spray Place 2 sprays into both nostrils daily. 16 g Starlit Raburn-Warren, Alda Lea, NP   albuterol (VENTOLIN HFA) 108 (90 Base) MCG/ACT inhaler Inhale 2 puffs into the lungs every 6 (six) hours as needed for wheezing or shortness of breath. 8 g Deundra Bard-Warren, Alda Lea, NP   doxycycline (VIBRA-TABS) 100 MG tablet Take 1 tablet (100 mg total) by mouth 2 (two) times daily. 20 tablet Elya Tarquinio-Warren, Alda Lea, NP      PDMP not reviewed this encounter.   Tish Men, NP 11/19/21 1317

## 2021-11-19 NOTE — ED Triage Notes (Signed)
States she went to a funeral on Saturday.  Sore throat started on Saturday night.  Head congestion with chest congestion.  Productive cough.  Has been taking sudafed and cough medication and states symptoms are getting worse.

## 2021-11-19 NOTE — Discharge Instructions (Addendum)
Take medication as prescribed.  May continue waiting Sudafed as needed. Increase fluids and allow for plenty of rest. Recommend Tylenol or ibuprofen as needed for pain, fever, or general discomfort. Warm salt water gargles 3-4 times daily to help with throat pain or discomfort. As discussed, if your symptoms do not improve by 11/24/2021, pick up your prescription for the antibiotic at your preferred pharmacy. Recommend using a humidifier at bedtime during sleep to help with cough and nasal congestion. Sleep elevated on 2 pillows while cough symptoms persist. Follow-up if your symptoms do not improve.

## 2021-11-20 LAB — COVID-19, FLU A+B NAA
Influenza A, NAA: NOT DETECTED
Influenza B, NAA: NOT DETECTED
SARS-CoV-2, NAA: NOT DETECTED

## 2022-02-28 IMAGING — CR DG LUMBAR SPINE COMPLETE 4+V
5 series · 5 of 5 positions shown · non-contrast
Comparison: November 06, 2016

CLINICAL DATA: Lower back pain.

EXAM:
LUMBAR SPINE - COMPLETE 4+ VIEW

[t lumbar spine ap]
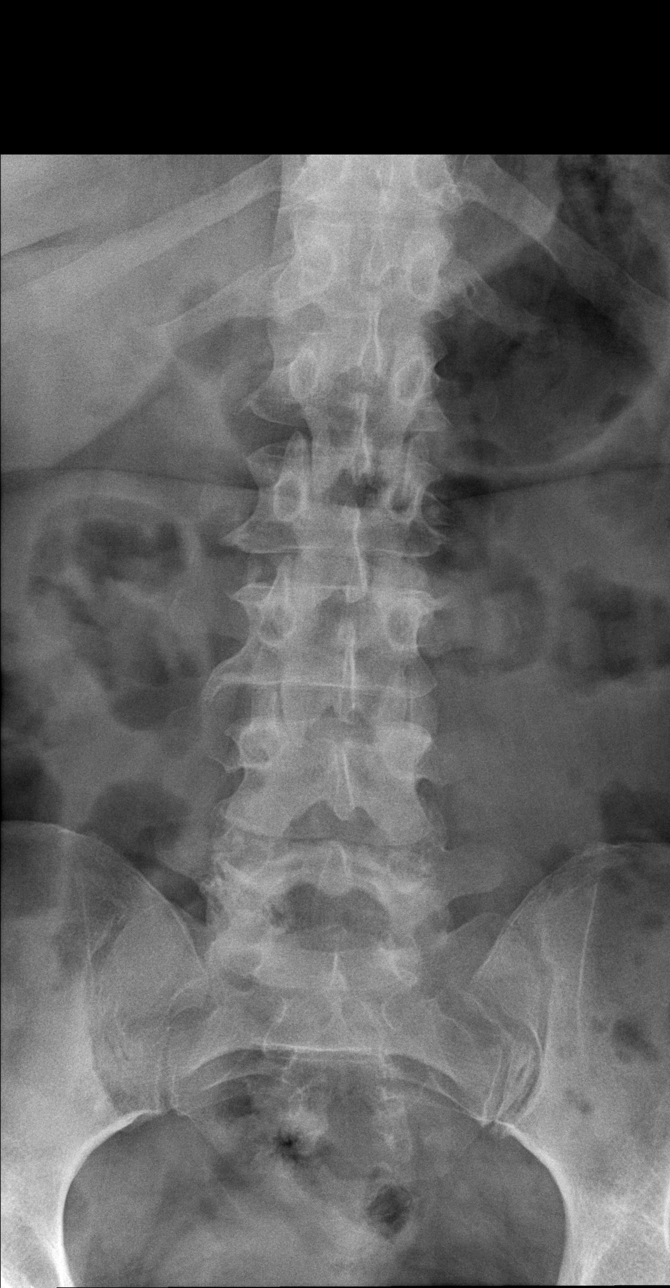

[t lumbar spine obl (1 of 2)]
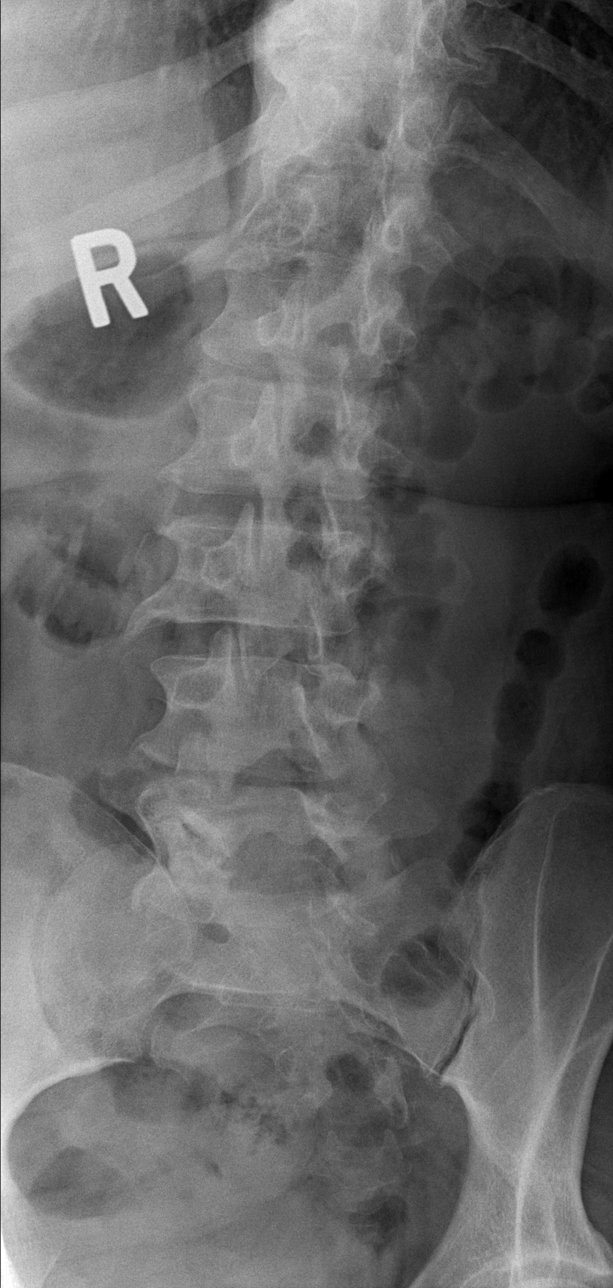

[t lumbar spine obl (2 of 2)]
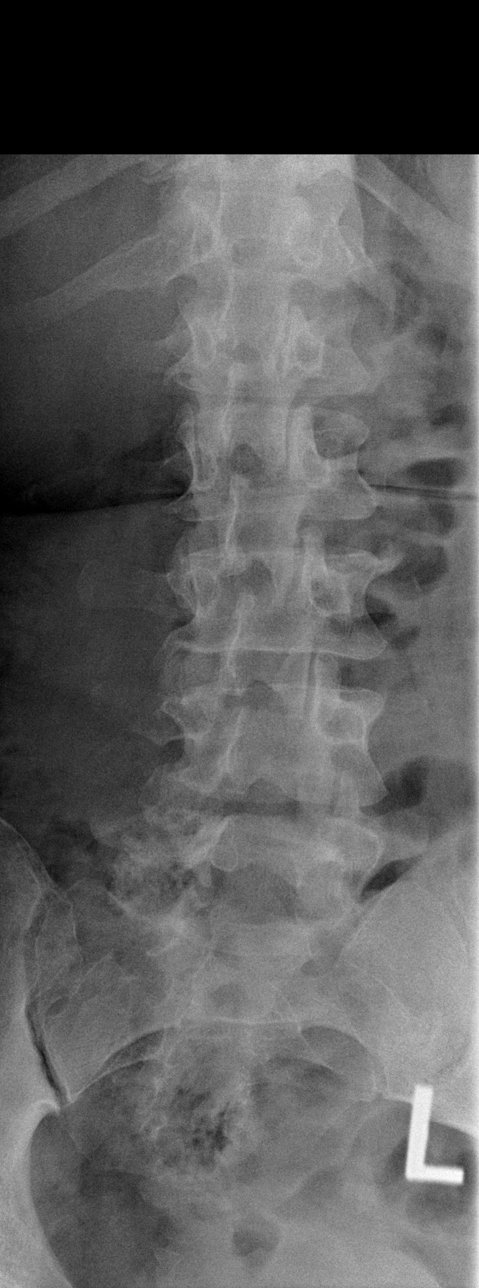

[t lumbar spine lat]
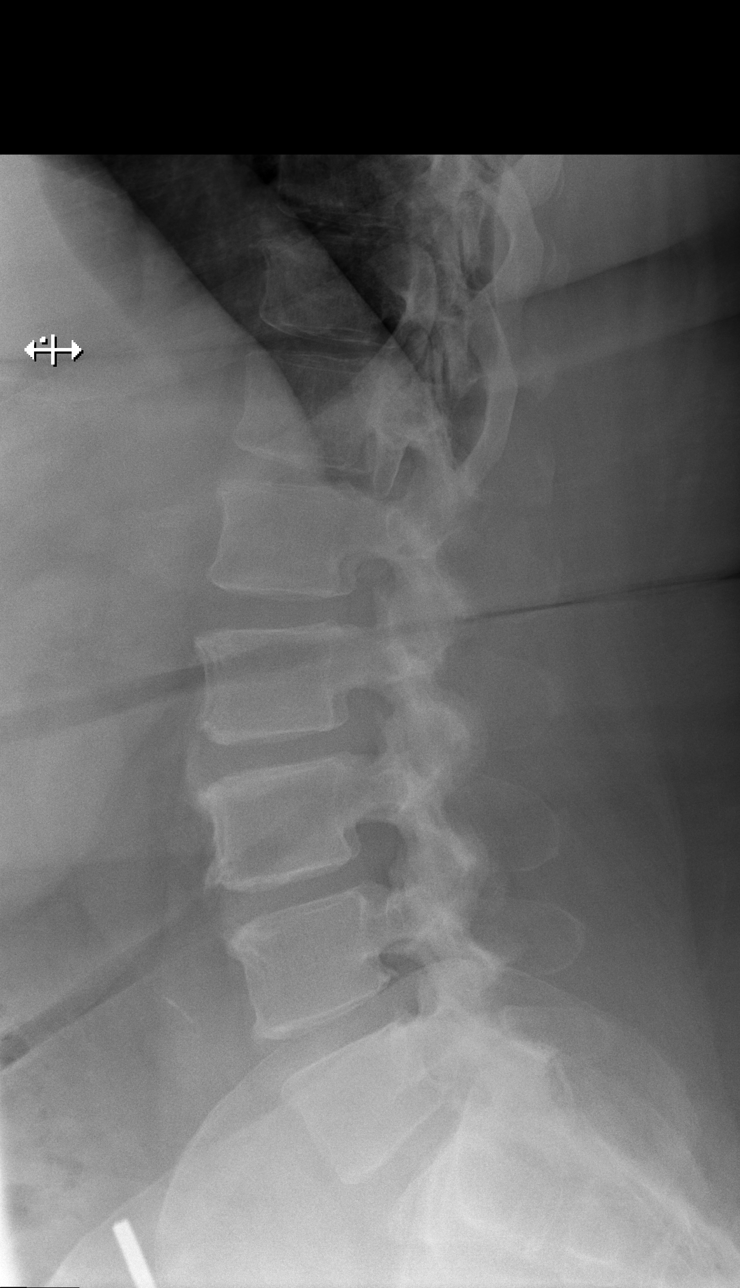

[t lumbar l-5 s-1 spot]
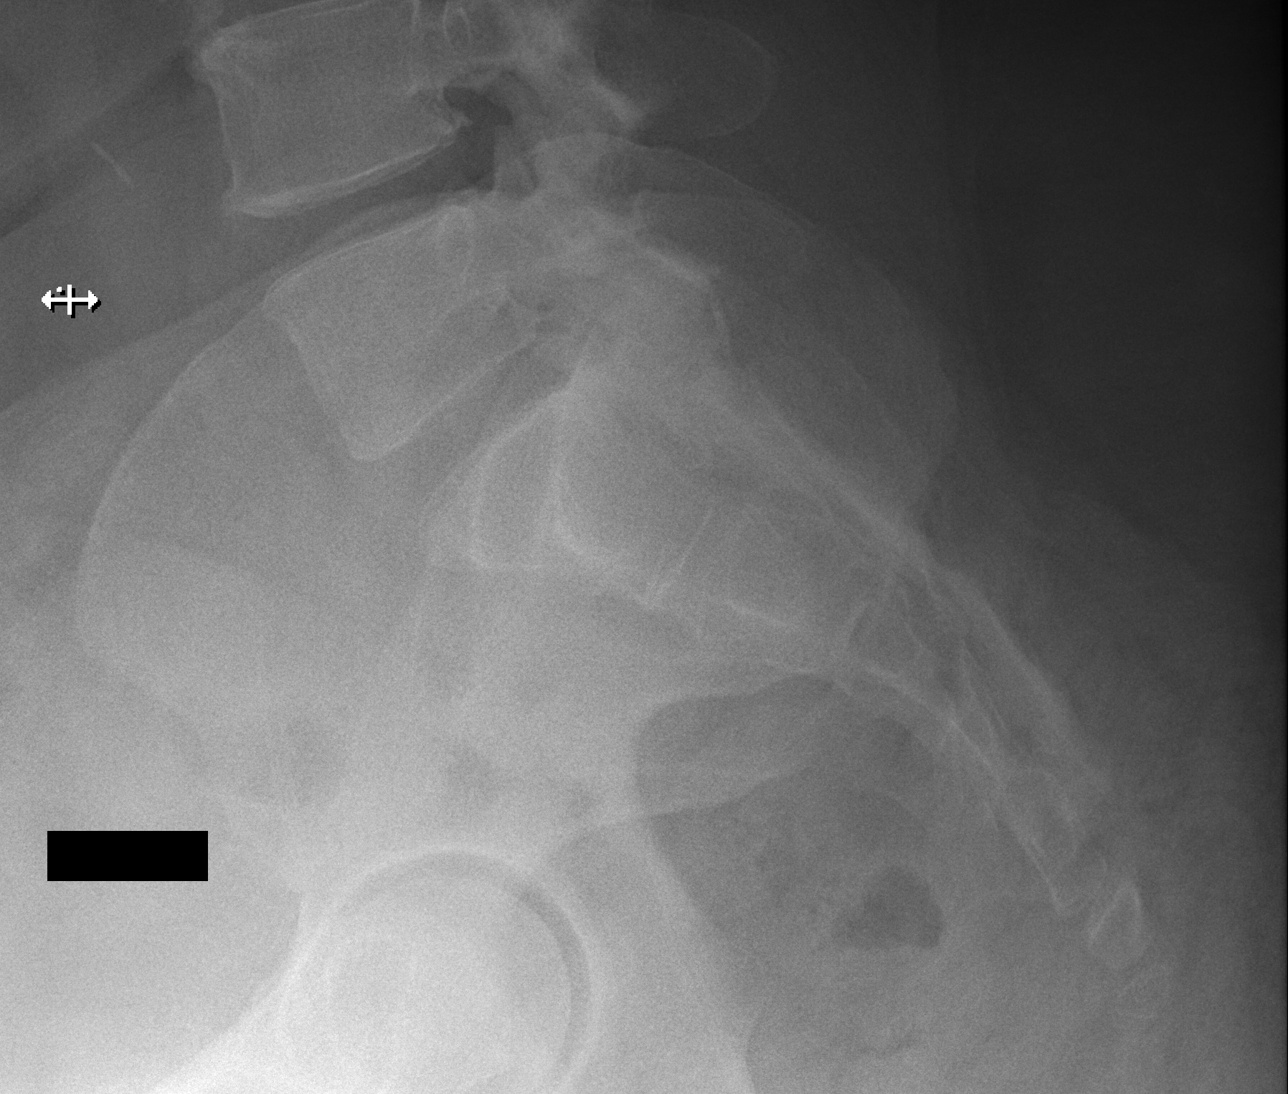

[5 of 5 positions shown; findings below may reference images not displayed]

FINDINGS: Again noted are mild degenerative changes throughout the lumbar
spine. These are essentially stable since 8147. There is some mild
facet arthrosis at the lower lumbar segments. There is no
malalignment. No acute displaced fracture. The osseous
mineralization appears to be within normal limits.
IMPRESSION: Mild degenerative changes of the lumbar spine, relatively stable
since [DATE].

## 2022-02-28 IMAGING — CR DG CHEST 2V
2 series · 2 of 2 positions shown · non-contrast
Comparison: 02/02/2017

CLINICAL DATA: Dyspnea

EXAM:
CHEST - 2 VIEW

[w chest pa]
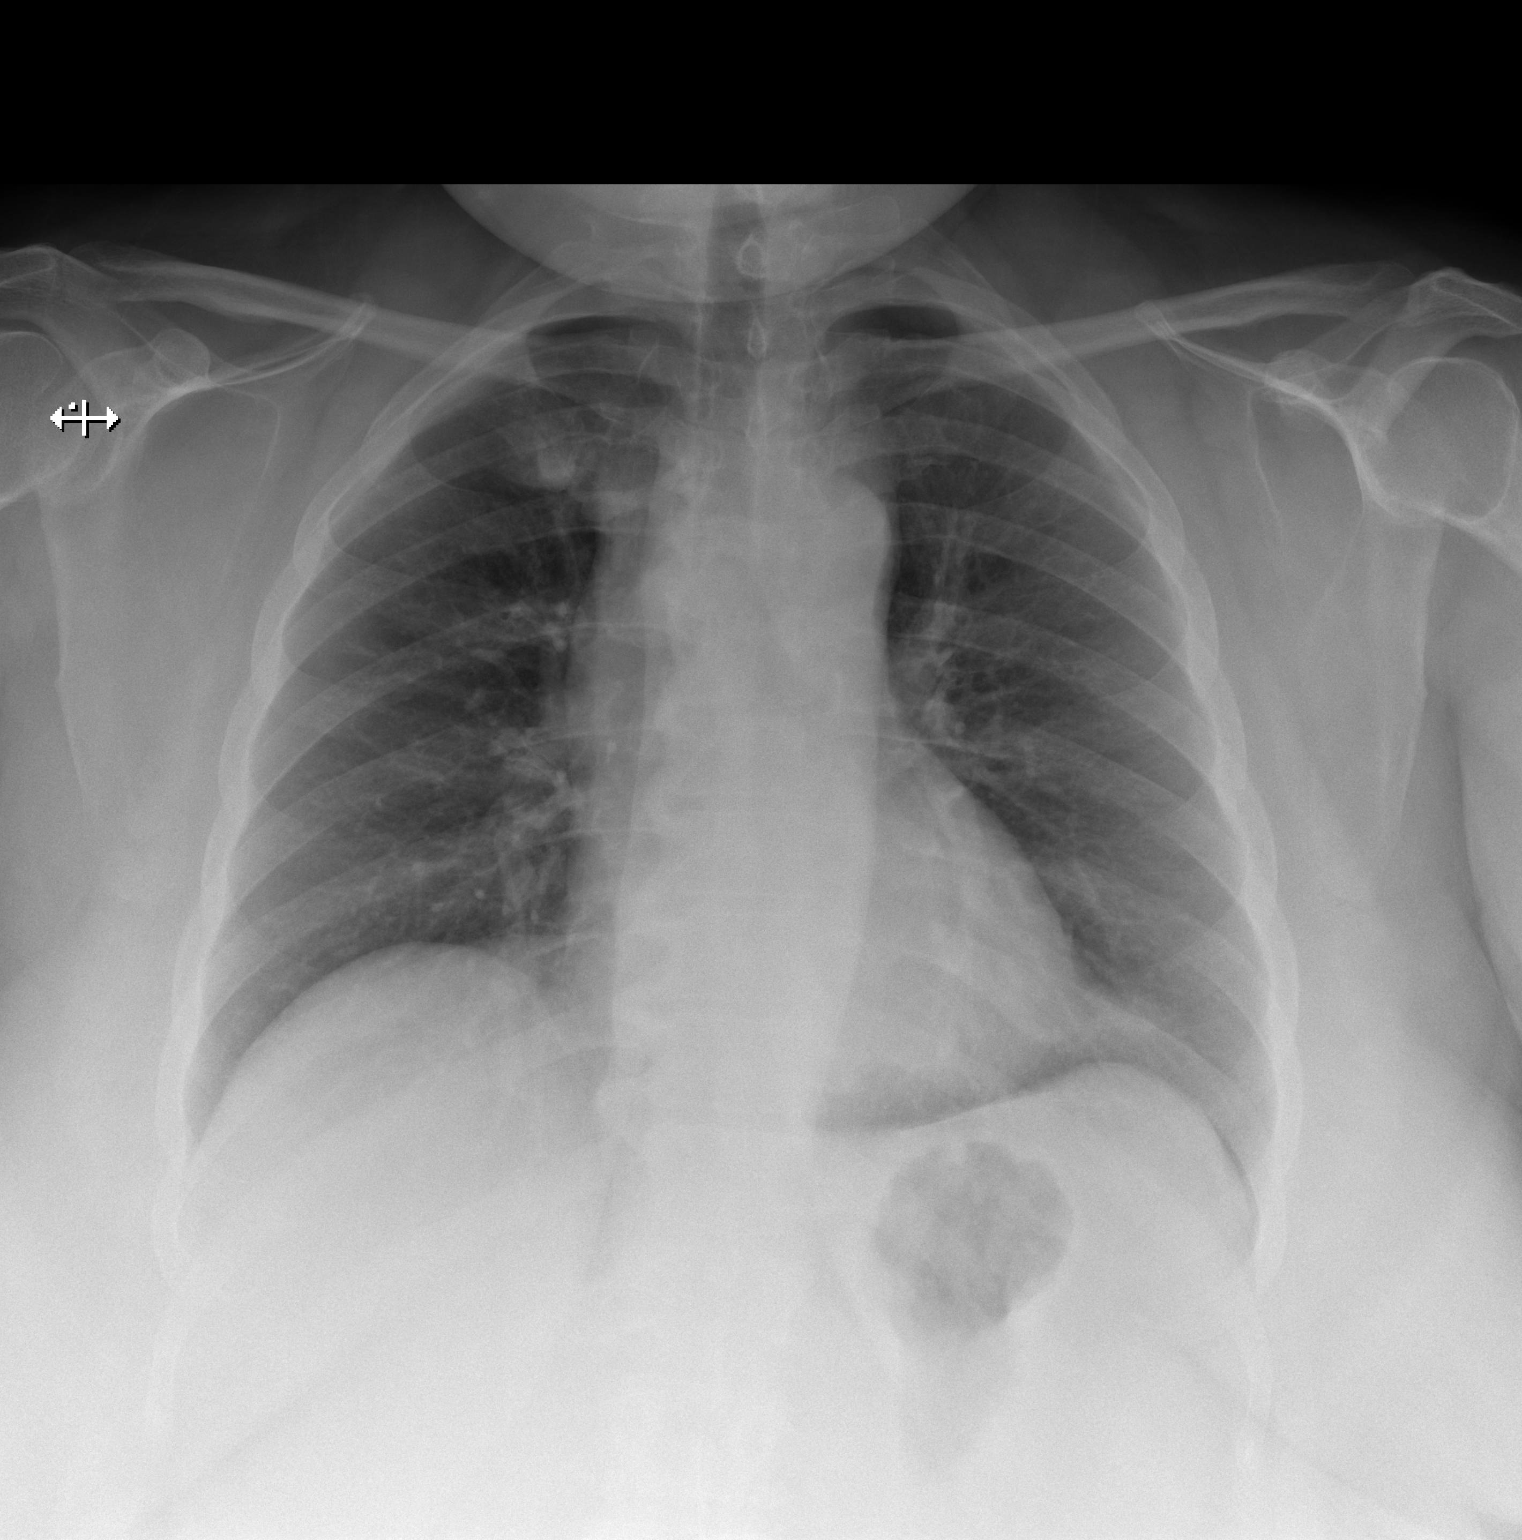

[w chest lat]
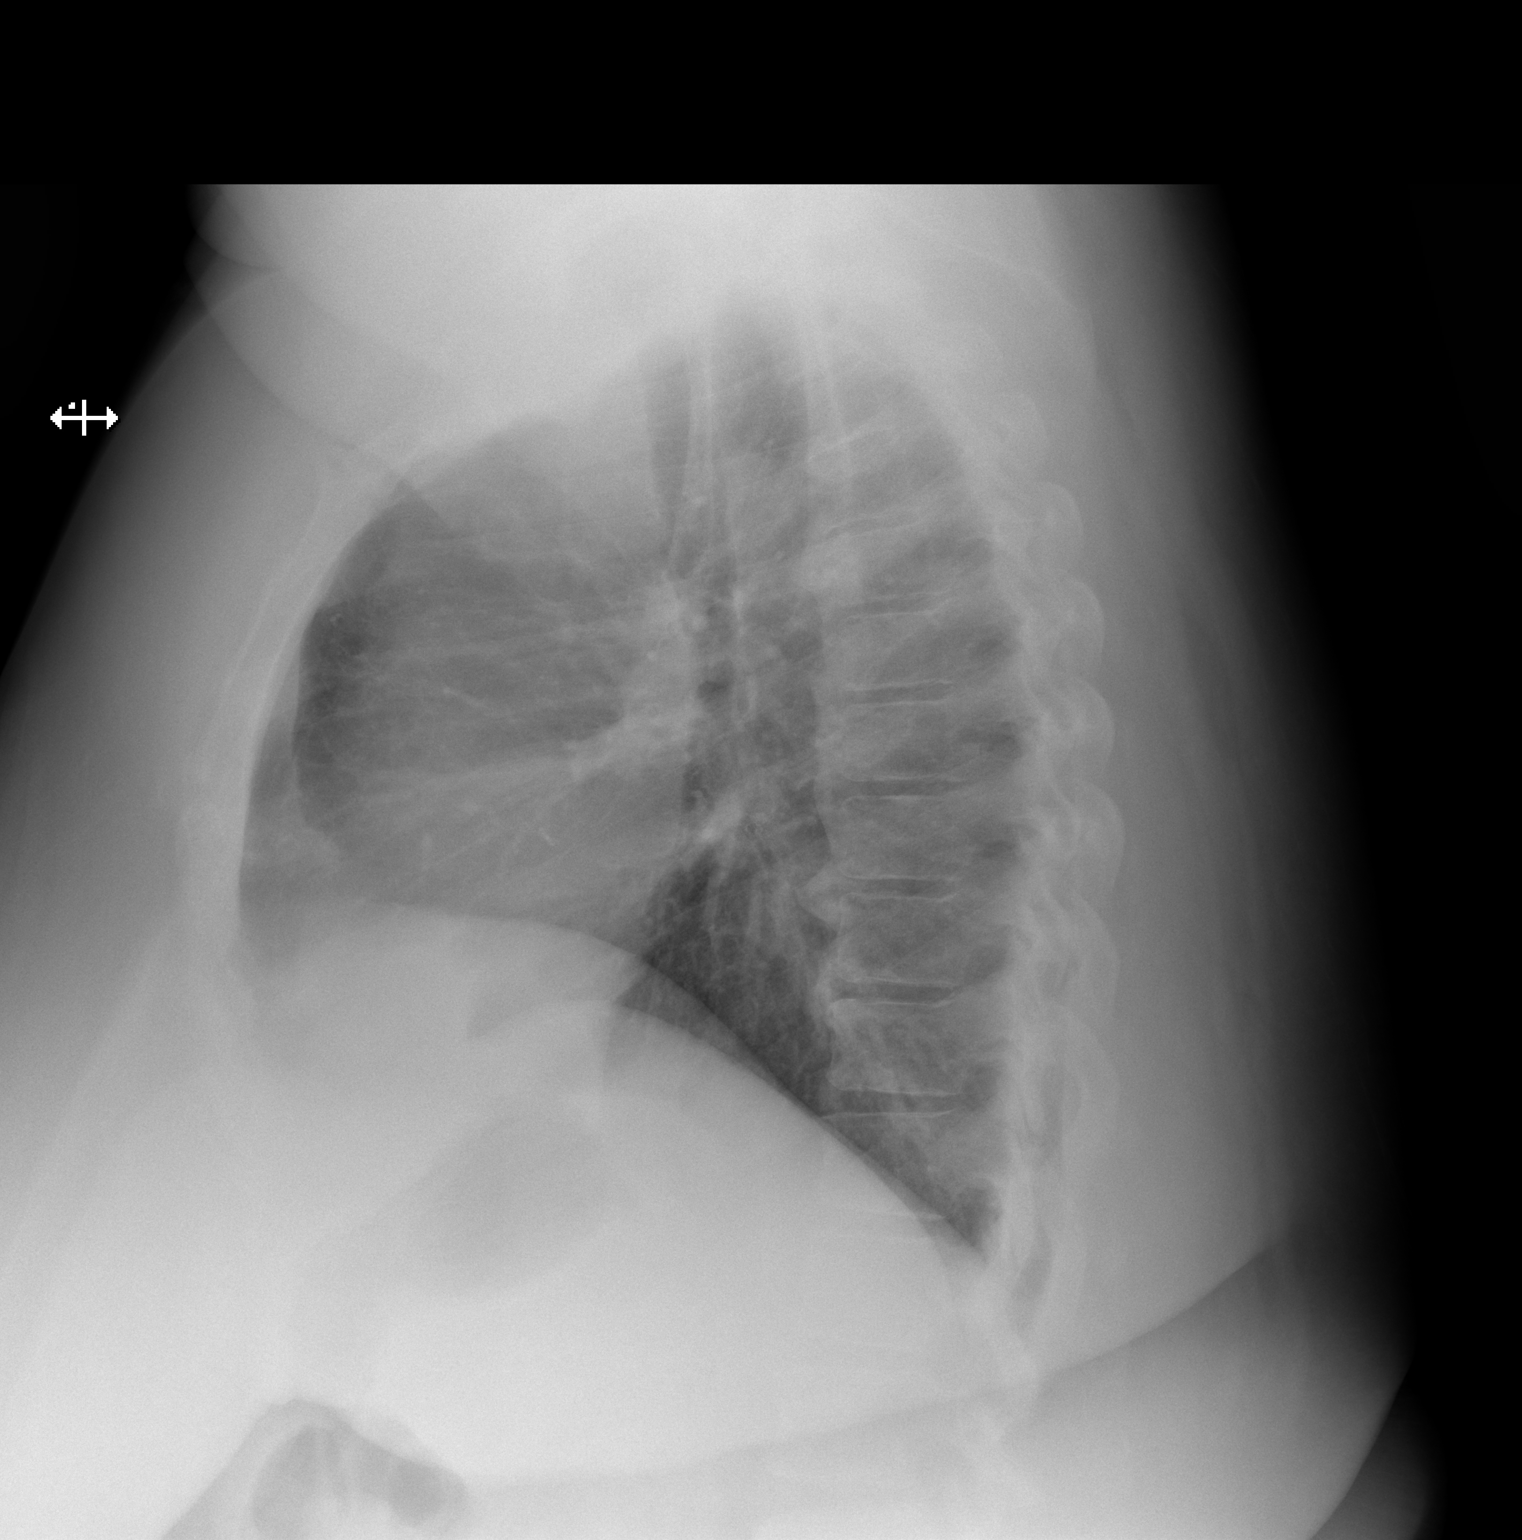

[2 of 2 positions shown; findings below may reference images not displayed]

FINDINGS: There is a rounded density in the right upper lung zone best
visualized on the frontal view. There is no pneumothorax. No
significant pleural effusion. The heart size is stable from prior
study. There is no acute osseous abnormality.
IMPRESSION: 1. No acute cardiopulmonary process.
2. Rounded density projecting over the right upper lung zone. This
is favored to represent a confluence of shadows secondary to
overlapping osseous structures. A 4-6 week follow-up two-view chest
x-ray is recommended to confirm stability of this finding.
Alternatively, consider a nonemergent outpatient CT of the chest.

## 2023-03-16 ENCOUNTER — Encounter (HOSPITAL_COMMUNITY): Payer: Self-pay | Admitting: *Deleted

## 2023-03-16 ENCOUNTER — Other Ambulatory Visit: Payer: Self-pay

## 2023-03-16 ENCOUNTER — Emergency Department (HOSPITAL_COMMUNITY)
Admission: EM | Admit: 2023-03-16 | Discharge: 2023-03-16 | Disposition: A | Discharge status: Home / Self Care | Payer: No Typology Code available for payment source | Attending: Emergency Medicine | Admitting: Emergency Medicine

## 2023-03-16 DIAGNOSIS — T22012A Burn of unspecified degree of left forearm, initial encounter: Secondary | ICD-10-CM | POA: Diagnosis present

## 2023-03-16 DIAGNOSIS — T310 Burns involving less than 10% of body surface: Secondary | ICD-10-CM | POA: Insufficient documentation

## 2023-03-16 DIAGNOSIS — T22112A Burn of first degree of left forearm, initial encounter: Secondary | ICD-10-CM | POA: Diagnosis not present

## 2023-03-16 DIAGNOSIS — Y92481 Parking lot as the place of occurrence of the external cause: Secondary | ICD-10-CM | POA: Insufficient documentation

## 2023-03-16 DIAGNOSIS — X17XXXA Contact with hot engines, machinery and tools, initial encounter: Secondary | ICD-10-CM | POA: Insufficient documentation

## 2023-03-16 DIAGNOSIS — Z23 Encounter for immunization: Secondary | ICD-10-CM | POA: Diagnosis not present

## 2023-03-16 HISTORY — DX: Irritable bowel syndrome, unspecified: K58.9

## 2023-03-16 MED ORDER — TETANUS-DIPHTH-ACELL PERTUSSIS 5-2.5-18.5 LF-MCG/0.5 IM SUSY
0.5000 mL | PREFILLED_SYRINGE | Freq: Once | INTRAMUSCULAR | Status: AC
  Administered 2023-03-16: 0.5 mL via INTRAMUSCULAR
  Filled 2023-03-16: qty 0.5

## 2023-03-16 MED ORDER — BACITRACIN ZINC 500 UNIT/GM EX OINT
TOPICAL_OINTMENT | Freq: Two times a day (BID) | CUTANEOUS | Status: DC
  Administered 2023-03-16: 1 via TOPICAL
  Filled 2023-03-16: qty 0.9

## 2023-03-16 MED ORDER — IBUPROFEN 800 MG PO TABS: 800.0000 mg | ORAL_TABLET | Freq: Three times a day (TID) | ORAL | 0 refills | Status: DC | PRN
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
## 2023-03-16 NOTE — Discharge Instructions (Addendum)
Follow up with your Physicain for recheck
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
## 2023-11-06 ENCOUNTER — Encounter: Payer: Self-pay | Admitting: Advanced Practice Midwife

## 2024-03-03 ENCOUNTER — Encounter: Admitting: Obstetrics & Gynecology

## 2024-05-03 ENCOUNTER — Encounter: Payer: Self-pay | Admitting: Obstetrics & Gynecology

## 2024-05-03 ENCOUNTER — Other Ambulatory Visit (HOSPITAL_COMMUNITY)
Admission: RE | Admit: 2024-05-03 | Discharge: 2024-05-03 | Disposition: A | Payer: Self-pay | Source: Ambulatory Visit | Attending: Obstetrics & Gynecology | Admitting: Obstetrics & Gynecology

## 2024-05-03 ENCOUNTER — Ambulatory Visit: Admitting: Obstetrics & Gynecology

## 2024-05-03 VITALS — BP 129/85 | HR 92 | Ht 66.0 in | Wt 284.4 lb

## 2024-05-03 DIAGNOSIS — Z1151 Encounter for screening for human papillomavirus (HPV): Secondary | ICD-10-CM | POA: Diagnosis not present

## 2024-05-03 DIAGNOSIS — Z975 Presence of (intrauterine) contraceptive device: Secondary | ICD-10-CM

## 2024-05-03 DIAGNOSIS — Z113 Encounter for screening for infections with a predominantly sexual mode of transmission: Secondary | ICD-10-CM | POA: Insufficient documentation

## 2024-05-03 DIAGNOSIS — Z01419 Encounter for gynecological examination (general) (routine) without abnormal findings: Secondary | ICD-10-CM

## 2024-05-03 DIAGNOSIS — Z1231 Encounter for screening mammogram for malignant neoplasm of breast: Secondary | ICD-10-CM

## 2024-05-03 NOTE — Patient Instructions (Signed)
Please schedule a mammogram at one of the following locations:  [place]: [phone]  [facility] [place]:[phone] [address]  

## 2024-05-03 NOTE — Progress Notes (Signed)
" ° °  WELL-WOMAN EXAMINATION Patient name: Amy [name] MRN [id number]  Date of birth: 1975/07/26 Chief Complaint:   Gynecologic Exam  History of Present Illness:   Amy [name] is a 49 y.o. G28P1001  female being seen today for a routine well-woman exam.   Nexplanon for contraception- last replaced 2018  Typically do not have a regular period, but occasional spotting twice a year.  Denies significant heavy menstrual bleeding or dysmenorrhea.  Denies vaginal discharge, itching or irritation.  Denies urinary concerns.  Not currently sexually active.  Reports no acute GYN concerns  No LMP recorded. Patient has had an implant.  The current method of family planning is Nexplanon.    Last pap collected today.  Last mammogram: 09/2021. Last colonoscopy: 07/2019      No data to display            Review of Systems:   Pertinent items are noted in HPI Denies any headaches, blurred vision, fatigue, shortness of breath, chest pain, abdominal pain, bowel movements, urination, or intercourse unless otherwise stated above.  Pertinent History Reviewed:  Reviewed past medical,surgical, social and family history.  Reviewed problem list, medications and allergies. Physical Assessment:   Vitals:   05/03/24 1539  BP: 129/85  Pulse: 92  Weight: 284 lb 6.4 oz (129 kg)  Height: 5' 6 (1.676 m)  Body mass index is 45.9 kg/m.        Physical Examination:   General appearance - well appearing, and in no distress  Mental status - alert, oriented to person, place, and time  Psych:  She has a normal mood and affect  Skin - warm and dry, normal color, no suspicious lesions noted  Chest - effort normal, all lung fields clear to auscultation bilaterally  Heart - normal rate and regular rhythm  Neck:  midline trachea, no thyromegaly or nodules  Breasts - breasts appear normal, no suspicious masses, no skin or nipple changes or  axillary nodes  Abdomen - obese, soft, nontender, nondistended,  no masses or organomegaly  Pelvic - VULVA: normal appearing vulva with no masses, tenderness or lesions  VAGINA: normal appearing vagina with normal color and discharge, no lesions  CERVIX: normal appearing cervix without discharge or lesions, no CMT  Thin prep pap is done with HR HPV cotesting  UTERUS: uterus is felt to be normal size, shape, consistency and nontender   ADNEXA: No adnexal masses or tenderness noted.  Extremities:  No swelling or varicosities noted.  Nexplanon palpable in left arm- no evidence of migration  Chaperone: Tour Manager, PGY-2     Assessment & Plan:  1) Well-Woman Exam -pap collected, reviewed screening guidelines -mammogram ordered -colonoscopy up today  []  plan for Nexplanon removal at next available  Orders Placed This Encounter  Procedures   MM 3D SCREENING MAMMOGRAM BILATERAL BREAST    Meds: No orders of the defined types were placed in this encounter.   Follow-up: Return for Nexplanon removal at next available, mammogram ordered.   Jaiyah Beining, DO Attending Obstetrician & Gynecologist, [facility], [facility]   "

## 2024-05-06 LAB — CYTOLOGY - PAP
Adequacy: ABSENT
Chlamydia: NEGATIVE
Comment: NEGATIVE
Comment: NEGATIVE
Comment: NORMAL
Diagnosis: NEGATIVE
High risk HPV: NEGATIVE
Neisseria Gonorrhea: NEGATIVE

## 2024-05-10 ENCOUNTER — Encounter: Admitting: Adult Health

## 2024-05-14 ENCOUNTER — Ambulatory Visit: Payer: Self-pay | Admitting: Obstetrics & Gynecology

## 2024-05-16 ENCOUNTER — Ambulatory Visit (HOSPITAL_COMMUNITY)
Admission: RE | Admit: 2024-05-16 | Discharge: 2024-05-16 | Disposition: A | Discharge status: Home / Self Care | Source: Ambulatory Visit | Attending: Obstetrics & Gynecology | Admitting: Obstetrics & Gynecology

## 2024-05-16 DIAGNOSIS — Z1231 Encounter for screening mammogram for malignant neoplasm of breast: Secondary | ICD-10-CM | POA: Insufficient documentation
